# Patient Record
Sex: Female | Born: 1985 | Race: White | Hispanic: No | Smoking: Never smoker
Health system: Southern US, Community
[De-identification: ages and names within clinical notes are randomized; demographics above are authoritative.]

## PROBLEM LIST (undated history)

## (undated) DIAGNOSIS — F419 Anxiety disorder, unspecified: Secondary | ICD-10-CM

## (undated) DIAGNOSIS — O1413 Severe pre-eclampsia, third trimester: Secondary | ICD-10-CM

## (undated) DIAGNOSIS — R87629 Unspecified abnormal cytological findings in specimens from vagina: Secondary | ICD-10-CM

## (undated) HISTORY — PX: TONSILLECTOMY: SUR1361

## (undated) HISTORY — PX: COLONOSCOPY: SHX174

## (undated) HISTORY — PX: WISDOM TOOTH EXTRACTION: SHX21

## (undated) HISTORY — DX: Severe pre-eclampsia, third trimester: O14.13

---

## 2016-04-01 ENCOUNTER — Other Ambulatory Visit (HOSPITAL_COMMUNITY)
Admission: RE | Admit: 2016-04-01 | Discharge: 2016-04-01 | Disposition: A | Discharge status: Home / Self Care | Payer: 59 | Source: Ambulatory Visit | Attending: Obstetrics & Gynecology | Admitting: Obstetrics & Gynecology

## 2016-04-01 ENCOUNTER — Other Ambulatory Visit: Payer: Self-pay | Admitting: Obstetrics & Gynecology

## 2016-04-01 DIAGNOSIS — Z1151 Encounter for screening for human papillomavirus (HPV): Secondary | ICD-10-CM | POA: Diagnosis not present

## 2016-04-01 DIAGNOSIS — Z3009 Encounter for other general counseling and advice on contraception: Secondary | ICD-10-CM | POA: Diagnosis not present

## 2016-04-01 DIAGNOSIS — T8332XA Displacement of intrauterine contraceptive device, initial encounter: Secondary | ICD-10-CM | POA: Diagnosis not present

## 2016-04-01 DIAGNOSIS — Z01419 Encounter for gynecological examination (general) (routine) without abnormal findings: Secondary | ICD-10-CM | POA: Insufficient documentation

## 2016-04-01 DIAGNOSIS — Z01411 Encounter for gynecological examination (general) (routine) with abnormal findings: Secondary | ICD-10-CM | POA: Diagnosis not present

## 2016-04-01 DIAGNOSIS — R87810 Cervical high risk human papillomavirus (HPV) DNA test positive: Secondary | ICD-10-CM | POA: Insufficient documentation

## 2016-04-03 LAB — CYTOLOGY - PAP
Diagnosis: NEGATIVE
HPV 16/18/45 genotyping: NEGATIVE
HPV: DETECTED — AB

## 2016-04-10 DIAGNOSIS — Z30432 Encounter for removal of intrauterine contraceptive device: Secondary | ICD-10-CM | POA: Diagnosis not present

## 2016-04-10 DIAGNOSIS — T8332XA Displacement of intrauterine contraceptive device, initial encounter: Secondary | ICD-10-CM | POA: Diagnosis not present

## 2016-05-28 DIAGNOSIS — Z3400 Encounter for supervision of normal first pregnancy, unspecified trimester: Secondary | ICD-10-CM | POA: Diagnosis not present

## 2016-05-28 LAB — OB RESULTS CONSOLE HIV ANTIBODY (ROUTINE TESTING): HIV: NONREACTIVE

## 2016-05-28 LAB — OB RESULTS CONSOLE RPR: RPR: NONREACTIVE

## 2016-05-28 LAB — OB RESULTS CONSOLE RUBELLA ANTIBODY, IGM: Rubella: NON-IMMUNE/NOT IMMUNE

## 2016-05-28 LAB — OB RESULTS CONSOLE HEPATITIS B SURFACE ANTIGEN: HEP B S AG: NEGATIVE

## 2016-05-29 DIAGNOSIS — O26841 Uterine size-date discrepancy, first trimester: Secondary | ICD-10-CM | POA: Diagnosis not present

## 2016-06-19 DIAGNOSIS — Z3401 Encounter for supervision of normal first pregnancy, first trimester: Secondary | ICD-10-CM | POA: Diagnosis not present

## 2016-06-19 LAB — OB RESULTS CONSOLE GC/CHLAMYDIA
CHLAMYDIA, DNA PROBE: NEGATIVE
GC PROBE AMP, GENITAL: NEGATIVE

## 2016-08-14 DIAGNOSIS — Z3400 Encounter for supervision of normal first pregnancy, unspecified trimester: Secondary | ICD-10-CM | POA: Diagnosis not present

## 2016-08-19 DIAGNOSIS — Z36 Encounter for antenatal screening for chromosomal anomalies: Secondary | ICD-10-CM | POA: Diagnosis not present

## 2016-08-19 DIAGNOSIS — O219 Vomiting of pregnancy, unspecified: Secondary | ICD-10-CM | POA: Diagnosis not present

## 2016-08-19 DIAGNOSIS — Z3402 Encounter for supervision of normal first pregnancy, second trimester: Secondary | ICD-10-CM | POA: Diagnosis not present

## 2016-10-10 DIAGNOSIS — Z3402 Encounter for supervision of normal first pregnancy, second trimester: Secondary | ICD-10-CM | POA: Diagnosis not present

## 2016-10-10 DIAGNOSIS — O219 Vomiting of pregnancy, unspecified: Secondary | ICD-10-CM | POA: Diagnosis not present

## 2016-11-07 DIAGNOSIS — O26843 Uterine size-date discrepancy, third trimester: Secondary | ICD-10-CM | POA: Diagnosis not present

## 2016-11-07 DIAGNOSIS — Z23 Encounter for immunization: Secondary | ICD-10-CM | POA: Diagnosis not present

## 2016-11-07 DIAGNOSIS — Z3403 Encounter for supervision of normal first pregnancy, third trimester: Secondary | ICD-10-CM | POA: Diagnosis not present

## 2016-11-18 ENCOUNTER — Encounter (HOSPITAL_COMMUNITY): Payer: Self-pay | Admitting: *Deleted

## 2016-11-18 ENCOUNTER — Inpatient Hospital Stay (HOSPITAL_COMMUNITY)
Admission: AD | Admit: 2016-11-18 | Discharge: 2016-11-23 | DRG: 765 | Disposition: A | Discharge status: Home / Self Care | Payer: 59 | Source: Ambulatory Visit | Attending: Obstetrics & Gynecology | Admitting: Obstetrics & Gynecology

## 2016-11-18 DIAGNOSIS — Z760 Encounter for issue of repeat prescription: Secondary | ICD-10-CM | POA: Diagnosis not present

## 2016-11-18 DIAGNOSIS — R030 Elevated blood-pressure reading, without diagnosis of hypertension: Secondary | ICD-10-CM | POA: Diagnosis present

## 2016-11-18 DIAGNOSIS — Z3A32 32 weeks gestation of pregnancy: Secondary | ICD-10-CM | POA: Diagnosis not present

## 2016-11-18 DIAGNOSIS — O3403 Maternal care for unspecified congenital malformation of uterus, third trimester: Secondary | ICD-10-CM | POA: Diagnosis present

## 2016-11-18 DIAGNOSIS — E871 Hypo-osmolality and hyponatremia: Secondary | ICD-10-CM | POA: Diagnosis present

## 2016-11-18 DIAGNOSIS — O1413 Severe pre-eclampsia, third trimester: Secondary | ICD-10-CM

## 2016-11-18 DIAGNOSIS — O1420 HELLP syndrome (HELLP), unspecified trimester: Secondary | ICD-10-CM | POA: Diagnosis present

## 2016-11-18 DIAGNOSIS — O9962 Diseases of the digestive system complicating childbirth: Secondary | ICD-10-CM | POA: Diagnosis present

## 2016-11-18 DIAGNOSIS — O365930 Maternal care for other known or suspected poor fetal growth, third trimester, not applicable or unspecified: Secondary | ICD-10-CM | POA: Diagnosis present

## 2016-11-18 DIAGNOSIS — K219 Gastro-esophageal reflux disease without esophagitis: Secondary | ICD-10-CM | POA: Diagnosis present

## 2016-11-18 DIAGNOSIS — O1424 HELLP syndrome, complicating childbirth: Secondary | ICD-10-CM | POA: Diagnosis present

## 2016-11-18 DIAGNOSIS — Q512 Other doubling of uterus: Secondary | ICD-10-CM | POA: Diagnosis not present

## 2016-11-18 DIAGNOSIS — R740 Nonspecific elevation of levels of transaminase and lactic acid dehydrogenase [LDH]: Secondary | ICD-10-CM | POA: Diagnosis not present

## 2016-11-18 DIAGNOSIS — O1403 Mild to moderate pre-eclampsia, third trimester: Secondary | ICD-10-CM | POA: Diagnosis not present

## 2016-11-18 DIAGNOSIS — O1423 HELLP syndrome (HELLP), third trimester: Secondary | ICD-10-CM | POA: Diagnosis not present

## 2016-11-18 DIAGNOSIS — O324XX0 Maternal care for high head at term, not applicable or unspecified: Secondary | ICD-10-CM | POA: Diagnosis not present

## 2016-11-18 HISTORY — DX: Severe pre-eclampsia, third trimester: O14.13

## 2016-11-18 LAB — PROTEIN / CREATININE RATIO, URINE
Creatinine, Urine: 39 mg/dL
PROTEIN CREATININE RATIO: 0.51 mg/mg{creat} — AB (ref 0.00–0.15)
TOTAL PROTEIN, URINE: 20 mg/dL

## 2016-11-18 LAB — URINALYSIS, ROUTINE W REFLEX MICROSCOPIC
Bilirubin Urine: NEGATIVE
Glucose, UA: NEGATIVE mg/dL
Hgb urine dipstick: NEGATIVE
KETONES UR: NEGATIVE mg/dL
LEUKOCYTES UA: NEGATIVE
NITRITE: NEGATIVE
PROTEIN: NEGATIVE mg/dL
Specific Gravity, Urine: 1.006 (ref 1.005–1.030)
pH: 6 (ref 5.0–8.0)

## 2016-11-18 LAB — COMPREHENSIVE METABOLIC PANEL
ALT: 396 U/L — ABNORMAL HIGH (ref 14–54)
ANION GAP: 8 (ref 5–15)
AST: 272 U/L — AB (ref 15–41)
Albumin: 3.6 g/dL (ref 3.5–5.0)
Alkaline Phosphatase: 195 U/L — ABNORMAL HIGH (ref 38–126)
BUN: 16 mg/dL (ref 6–20)
CHLORIDE: 102 mmol/L (ref 101–111)
CO2: 24 mmol/L (ref 22–32)
Calcium: 9.1 mg/dL (ref 8.9–10.3)
Creatinine, Ser: 0.88 mg/dL (ref 0.44–1.00)
Glucose, Bld: 81 mg/dL (ref 65–99)
POTASSIUM: 3.9 mmol/L (ref 3.5–5.1)
Sodium: 134 mmol/L — ABNORMAL LOW (ref 135–145)
TOTAL PROTEIN: 7.1 g/dL (ref 6.5–8.1)
Total Bilirubin: 1 mg/dL (ref 0.3–1.2)

## 2016-11-18 LAB — CBC
HEMATOCRIT: 37.1 % (ref 36.0–46.0)
HEMOGLOBIN: 13.7 g/dL (ref 12.0–15.0)
MCH: 33.7 pg (ref 26.0–34.0)
MCHC: 36.9 g/dL — ABNORMAL HIGH (ref 30.0–36.0)
MCV: 91.4 fL (ref 78.0–100.0)
Platelets: 113 10*3/uL — ABNORMAL LOW (ref 150–400)
RBC: 4.06 MIL/uL (ref 3.87–5.11)
RDW: 12.7 % (ref 11.5–15.5)
WBC: 7.8 10*3/uL (ref 4.0–10.5)

## 2016-11-18 LAB — URIC ACID: Uric Acid, Serum: 6 mg/dL (ref 2.3–6.6)

## 2016-11-18 LAB — LACTATE DEHYDROGENASE: LDH: 300 U/L — ABNORMAL HIGH (ref 98–192)

## 2016-11-18 MED ORDER — LACTATED RINGERS IV SOLN
INTRAVENOUS | Status: DC
  Administered 2016-11-19: 10:00:00 via INTRAVENOUS

## 2016-11-18 MED ORDER — BETAMETHASONE SOD PHOS & ACET 6 (3-3) MG/ML IJ SUSP
12.0000 mg | INTRAMUSCULAR | Status: AC
  Administered 2016-11-18 – 2016-11-19 (×2): 12 mg via INTRAMUSCULAR
  Filled 2016-11-18 (×2): qty 2

## 2016-11-18 MED ORDER — CALCIUM CARBONATE ANTACID 500 MG PO CHEW: 2.0000 | CHEWABLE_TABLET | ORAL | Status: DC | PRN

## 2016-11-18 MED ORDER — PRENATAL MULTIVITAMIN CH: 1.0000 | ORAL_TABLET | Freq: Every day | ORAL | Status: DC

## 2016-11-18 MED ORDER — DOCUSATE SODIUM 100 MG PO CAPS: 100.0000 mg | ORAL_CAPSULE | Freq: Every day | ORAL | Status: DC

## 2016-11-18 MED ORDER — LABETALOL HCL 5 MG/ML IV SOLN
20.0000 mg | INTRAVENOUS | Status: DC | PRN
  Filled 2016-11-18: qty 16

## 2016-11-18 MED ORDER — ZOLPIDEM TARTRATE 5 MG PO TABS: 5.0000 mg | ORAL_TABLET | Freq: Every evening | ORAL | Status: DC | PRN

## 2016-11-18 MED ORDER — HYDRALAZINE HCL 20 MG/ML IJ SOLN
10.0000 mg | Freq: Once | INTRAMUSCULAR | Status: DC | PRN
  Filled 2016-11-18: qty 0.5

## 2016-11-18 MED ORDER — ACETAMINOPHEN 325 MG PO TABS
650.0000 mg | ORAL_TABLET | ORAL | Status: DC | PRN
  Administered 2016-11-19: 650 mg via ORAL
  Filled 2016-11-18: qty 2

## 2016-11-18 MED ORDER — PANTOPRAZOLE SODIUM 40 MG IV SOLR
40.0000 mg | Freq: Once | INTRAVENOUS | Status: AC
  Administered 2016-11-18: 40 mg via INTRAVENOUS
  Filled 2016-11-18: qty 40

## 2016-11-18 MED ORDER — MAGNESIUM SULFATE 40 G IN LACTATED RINGERS - SIMPLE
1.0000 g/h | INTRAVENOUS | Status: DC
  Administered 2016-11-18 – 2016-11-19 (×2): 2 g/h via INTRAVENOUS
  Administered 2016-11-21: 1 g/h via INTRAVENOUS
  Filled 2016-11-18 (×2): qty 40
  Filled 2016-11-18 (×2): qty 500

## 2016-11-18 MED ORDER — MAGNESIUM SULFATE BOLUS VIA INFUSION
4.0000 g | Freq: Once | INTRAVENOUS | Status: AC
  Administered 2016-11-18: 4 g via INTRAVENOUS
  Filled 2016-11-18: qty 500

## 2016-11-18 NOTE — MAU Note (Signed)
Pt presents to MAU with complaints of elevated liver enzymes and increase in blood pressure. Pt states she had an insurance client come over to fill out a life insurance policy and they drew labs. She reviewed her labs online Saturday and her liver enzymes were elevated. Denies any headache of blurred vision.

## 2016-11-18 NOTE — H&P (Signed)
Beverly Gonzalez is a 31 y.o. female G1P0 at 32 wks and 0 days. presenting for elevated blood pressure and elevated liver enzymes from out side lab.Pt had labs drawn 11/13/2016 at outside lab for life insurance and noted that her AST was 89 and ALT was 98. She began checking her bp at home this afternoon and was elevated 14-150/80-90. She denies headache visual disturbances or ruq pain. + FM no lof no contractions.    OB History    Gravida Para Term Preterm AB Living   1             SAB TAB Ectopic Multiple Live Births                 History reviewed. No pertinent past medical history. Past Surgical History:  Procedure Laterality Date  . TONSILLECTOMY    . WISDOM TOOTH EXTRACTION     Family History: family history is not on file. Social History:  reports that she has never smoked. She has never used smokeless tobacco. She reports that she does not drink alcohol or use drugs.    Review of Systems  Constitutional: Negative.   HENT: Negative.   Eyes: Negative.   Respiratory: Negative.   Cardiovascular: Negative.   Gastrointestinal: Negative.   Genitourinary: Negative.   Skin: Negative.   Neurological: Negative.   Endo/Heme/Allergies: Negative.   Psychiatric/Behavioral: Negative.    History Dilation: Closed Exam by:: DR Kamron Vanwyhe Blood pressure (!) 144/96, pulse (!) 57, temperature 98 F (36.7 C), resp. rate 18, height 5\' 10"  (1.778 m), weight 76.2 kg (168 lb), last menstrual period 04/08/2016. Exam Physical Exam  Vitals reviewed. Constitutional: She is oriented to person, place, and time. She appears well-developed and well-nourished.  HENT:  Head: Normocephalic and atraumatic.  Eyes: Conjunctivae are normal. Pupils are equal, round, and reactive to light.  Neck: Normal range of motion.  Cardiovascular: Normal rate and regular rhythm.   Respiratory: Effort normal and breath sounds normal. No respiratory distress. She has no wheezes. She has no rales. She exhibits no  tenderness.  GI: There is no tenderness.  Genitourinary: Vagina normal.  Musculoskeletal: Normal range of motion. She exhibits no edema.  Neurological: She is alert and oriented to person, place, and time. She displays abnormal reflex.  Skin: Skin is warm and dry.  Psychiatric: She has a normal mood and affect.   Cervix is closed   Prenatal labs: ABO, Rh:  O positive  Antibody:  Negative  Rubella:  Nonimmune  RPR:  nonreactive   HBsAg:   Negative  HIV:   Negative  GBS:   pending  Bedside ultrasound for presentation by me fetus is cephalic    Results for orders placed or performed during the hospital encounter of 11/18/16 (from the past 24 hour(s))  Protein / creatinine ratio, urine     Status: Abnormal   Collection Time: 11/18/16  5:14 PM  Result Value Ref Range   Creatinine, Urine 39.00 mg/dL   Total Protein, Urine 20 mg/dL   Protein Creatinine Ratio 0.51 (H) 0.00 - 0.15 mg/mg[Cre]  Urinalysis, Routine w reflex microscopic     Status: None   Collection Time: 11/18/16  5:40 PM  Result Value Ref Range   Color, Urine YELLOW YELLOW   APPearance CLEAR CLEAR   Specific Gravity, Urine 1.006 1.005 - 1.030   pH 6.0 5.0 - 8.0   Glucose, UA NEGATIVE NEGATIVE mg/dL   Hgb urine dipstick NEGATIVE NEGATIVE   Bilirubin Urine NEGATIVE NEGATIVE  Ketones, ur NEGATIVE NEGATIVE mg/dL   Protein, ur NEGATIVE NEGATIVE mg/dL   Nitrite NEGATIVE NEGATIVE   Leukocytes, UA NEGATIVE NEGATIVE  Comprehensive metabolic panel     Status: Abnormal   Collection Time: 11/18/16  5:41 PM  Result Value Ref Range   Sodium 134 (L) 135 - 145 mmol/L   Potassium 3.9 3.5 - 5.1 mmol/L   Chloride 102 101 - 111 mmol/L   CO2 24 22 - 32 mmol/L   Glucose, Bld 81 65 - 99 mg/dL   BUN 16 6 - 20 mg/dL   Creatinine, Ser 0.88 0.44 - 1.00 mg/dL   Calcium 9.1 8.9 - 10.3 mg/dL   Total Protein 7.1 6.5 - 8.1 g/dL   Albumin 3.6 3.5 - 5.0 g/dL   AST 272 (H) 15 - 41 U/L   ALT 396 (H) 14 - 54 U/L   Alkaline Phosphatase 195  (H) 38 - 126 U/L   Total Bilirubin 1.0 0.3 - 1.2 mg/dL   GFR calc non Af Amer >60 >60 mL/min   GFR calc Af Amer >60 >60 mL/min   Anion gap 8 5 - 15  CBC     Status: Abnormal   Collection Time: 11/18/16  5:41 PM  Result Value Ref Range   WBC 7.8 4.0 - 10.5 K/uL   RBC 4.06 3.87 - 5.11 MIL/uL   Hemoglobin 13.7 12.0 - 15.0 g/dL   HCT 37.1 36.0 - 46.0 %   MCV 91.4 78.0 - 100.0 fL   MCH 33.7 26.0 - 34.0 pg   MCHC 36.9 (H) 30.0 - 36.0 g/dL   RDW 12.7 11.5 - 15.5 %   Platelets 113 (L) 150 - 400 K/uL  Lactate dehydrogenase     Status: Abnormal   Collection Time: 11/18/16  5:45 PM  Result Value Ref Range   LDH 300 (H) 98 - 192 U/L  Uric acid     Status: None   Collection Time: 11/18/16  5:45 PM  Result Value Ref Range   Uric Acid, Serum 6.0 2.3 - 6.6 mg/dL   Assessment/Plan: 32 wks and 0 days with preeclampsia with severe features.  -BMZ for fetal lung maturity  -Magnesium sulfate for seizure prophylaxis  -Labetalol / hydralazine for bp greater than  -PIH labs every 8 hours  -Spoke with Renella Cunas via phone she recommends expectant management until steroids are complete unless patients condition worsens MFM consult in the morning with ultrasound for EFW/AFI -NICU consult Dr. Cletis Media covering this evening Dr. Nelda Marseille to assume pt care at 7 am in the morning.    Beverly Gonzalez J. 11/18/2016, 8:17 PM

## 2016-11-18 NOTE — MAU Provider Note (Signed)
Chief Complaint:  Hypertension   HPI: Beverly Gonzalez is a 31 y.o. G1P0 at [redacted]w[redacted]d who presents to maternity admissions reporting elevated BP today with known transaminitis.  Last week patient was getting bloodwork for life insurance policy, was informed had elevated LFTs and mildly elevated protein in her urine (per paperwork of lab results: 11/06/16- creatinine 0.97, AST 89, ALT 98, PC ratio 0.2, no CBC drawn). She called her OB doctor's office, and was informed to watch her BPs. 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 So she came in to be evaluated. She denies any HAs, blurred vision, RUQ/epigastric pain, however is having severe heartburn (substernal burning), that is not being relieved by Tums or Zantac.   Denies contractions, leakage of fluid or vaginal bleeding. Good fetal movement.     Past Medical History: History reviewed. No pertinent past medical history.  Past obstetric history: OB History  Gravida Para Term Preterm AB Living  1            SAB TAB Ectopic Multiple Live Births               # Outcome Date GA Lbr Len/2nd Weight Sex Delivery Anes PTL Lv  1 Current               Past Surgical History: Past Surgical History:  Procedure Laterality Date  . TONSILLECTOMY    . WISDOM TOOTH EXTRACTION       Family History: History reviewed. No pertinent family history.  Social History: Social History  Substance Use Topics  . Smoking status: Never Smoker  . Smokeless tobacco: Never Used  . Alcohol use No    Allergies: No Known Allergies  Meds:  Prescriptions Prior to Admission  Medication Sig Dispense Refill Last Dose  . calcium carbonate (TUMS - DOSED IN MG ELEMENTAL CALCIUM) 500 MG chewable tablet Chew 1 tablet by mouth 3 (three) times daily as needed for indigestion or heartburn.   11/18/2016 at Unknown time  . Doxylamine-Pyridoxine (DICLEGIS) 10-10 MG TBEC Take 2 tablets by  mouth every evening.   11/17/2016 at Unknown time  . ondansetron (ZOFRAN-ODT) 4 MG disintegrating tablet Take 4 mg by mouth every 8 (eight) hours as needed for nausea or vomiting.   11/18/2016 at Unknown time  . ranitidine (ZANTAC) 75 MG tablet Take 75 mg by mouth 2 (two) times daily.   11/18/2016 at Unknown time    I have reviewed patient's Past Medical Hx, Surgical Hx, Family Hx, Social Hx, medications and allergies.   ROS:  A comprehensive ROS was negative except per HPI.    Physical Exam  Patient Vitals for the past 24 hrs:  BP Temp Pulse Resp Height Weight  11/18/16 1900 (!) 144/96 - (!) 57 - - -  11/18/16 1845 133/82 - (!) 43 - - -  11/18/16 1830 138/82 - (!) 45 - - -  11/18/16 1815 (!) 143/84 - (!) 41 - - -  11/18/16 1800 (!) 149/89 - (!) 44 - - -  11/18/16 1755 (!) 146/86 - (!) 45 - - -  11/18/16 1736 (!) 159/93 98 F (36.7 C) (!) 49 18 5\' 10"  (1.778 m) 168 lb (76.2 kg)   Constitutional: Well-developed, well-nourished female in no acute distress.  Cardiovascular: normal rate Respiratory: normal effort GI: Abd soft, non-tender, gravid appropriate for gestational age. Pos BS x 4 MS: Extremities nontender, no edema, normal ROM  Neurologic: Alert and oriented x 4.  GU: Neg CVAT. Pelvic: NEFG, physiologic discharge, no blood, cervix clean. No CMT     Labs: Results for orders placed or performed during the hospital encounter of 11/18/16 (from the past 24 hour(s))  Protein / creatinine ratio, urine     Status: Abnormal   Collection Time: 11/18/16  5:14 PM  Result Value Ref Range   Creatinine, Urine 39.00 mg/dL   Total Protein, Urine 20 mg/dL   Protein Creatinine Ratio 0.51 (H) 0.00 - 0.15 mg/mg[Cre]  Urinalysis, Routine w reflex microscopic     Status: None   Collection Time: 11/18/16  5:40 PM  Result Value Ref Range   Color, Urine YELLOW YELLOW   APPearance CLEAR CLEAR   Specific Gravity, Urine 1.006 1.005 - 1.030   pH 6.0 5.0 - 8.0   Glucose, UA NEGATIVE NEGATIVE mg/dL    Hgb urine dipstick NEGATIVE NEGATIVE   Bilirubin Urine NEGATIVE NEGATIVE   Ketones, ur NEGATIVE NEGATIVE mg/dL   Protein, ur NEGATIVE NEGATIVE mg/dL   Nitrite NEGATIVE NEGATIVE   Leukocytes, UA NEGATIVE NEGATIVE  Comprehensive metabolic panel     Status: Abnormal   Collection Time: 11/18/16  5:41 PM  Result Value Ref Range   Sodium 134 (L) 135 - 145 mmol/L   Potassium 3.9 3.5 - 5.1 mmol/L   Chloride 102 101 - 111 mmol/L   CO2 24 22 - 32 mmol/L   Glucose, Bld 81 65 - 99 mg/dL   BUN 16 6 - 20 mg/dL   Creatinine, Ser 0.88 0.44 - 1.00 mg/dL   Calcium 9.1 8.9 - 10.3 mg/dL   Total Protein 7.1 6.5 - 8.1 g/dL   Albumin 3.6 3.5 - 5.0 g/dL   AST 272 (H) 15 - 41 U/L   ALT 396 (H) 14 - 54 U/L   Alkaline Phosphatase 195 (H) 38 - 126 U/L   Total Bilirubin 1.0 0.3 - 1.2 mg/dL   GFR calc non Af Amer >60 >60 mL/min   GFR calc Af Amer >60 >60 mL/min   Anion gap 8 5 - 15  CBC     Status: Abnormal   Collection Time: 11/18/16  5:41 PM  Result Value Ref Range   WBC 7.8 4.0 - 10.5 K/uL   RBC 4.06 3.87 - 5.11 MIL/uL   Hemoglobin 13.7 12.0 - 15.0 g/dL   HCT 37.1 36.0 - 46.0 %   MCV 91.4 78.0 - 100.0 fL   MCH 33.7 26.0 - 34.0 pg   MCHC 36.9 (H) 30.0 - 36.0 g/dL   RDW 12.7 11.5 - 15.5 %   Platelets 113 (L) 150 - 400 K/uL    Imaging:  No results found.  MAU Course: CMP- AST 89>272; ALT 98>396 CBC - Plt 112 PC Ratio - 0.5 BPs 140s/80-90s  7:27 PM - Dr. Landry Mellow was present in MAU seeing the patient at the same time. She will be admitted, plan to be determined by Dr. Landry Mellow and MFM.    I personally reviewed the patient's NST today, found to be REACTIVE. 135 bpm, mod var, +accels, no decels. CTX: occasional, not strong.   MDM: Plan of care reviewed with patient, including labs and tests ordered and medical treatment. Dr. Landry Mellow here to discuss with the patient plan of care and possible delivery.    Assessment: Preeclampsia with severe features (LFTs, Platelets).    Plan: Admit to inpatient,  plan to be determined by Dr. Landry Mellow and MFM.    Katherine Basset, DO  [facility], [facility] 11/18/2016 7:07 PM

## 2016-11-19 ENCOUNTER — Inpatient Hospital Stay (HOSPITAL_COMMUNITY): Payer: 59 | Admitting: Anesthesiology

## 2016-11-19 ENCOUNTER — Inpatient Hospital Stay (HOSPITAL_COMMUNITY): Payer: 59

## 2016-11-19 LAB — COMPREHENSIVE METABOLIC PANEL
ALBUMIN: 3.6 g/dL (ref 3.5–5.0)
ALK PHOS: 173 U/L — AB (ref 38–126)
ALK PHOS: 205 U/L — AB (ref 38–126)
ALT: 361 U/L — AB (ref 14–54)
ALT: 351 U/L — AB (ref 14–54)
ALT: 349 U/L — ABNORMAL HIGH (ref 14–54)
AST: 258 U/L — AB (ref 15–41)
AST: 222 U/L — AB (ref 15–41)
AST: 200 U/L — ABNORMAL HIGH (ref 15–41)
Albumin: 2.9 g/dL — ABNORMAL LOW (ref 3.5–5.0)
Albumin: 3.4 g/dL — ABNORMAL LOW (ref 3.5–5.0)
Alkaline Phosphatase: 188 U/L — ABNORMAL HIGH (ref 38–126)
Anion gap: 9 (ref 5–15)
Anion gap: 12 (ref 5–15)
Anion gap: 12 (ref 5–15)
BILIRUBIN TOTAL: 0.8 mg/dL (ref 0.3–1.2)
BUN: 14 mg/dL (ref 6–20)
BUN: 13 mg/dL (ref 6–20)
BUN: 14 mg/dL (ref 6–20)
CALCIUM: 8.3 mg/dL — AB (ref 8.9–10.3)
CHLORIDE: 103 mmol/L (ref 101–111)
CHLORIDE: 100 mmol/L — AB (ref 101–111)
CO2: 20 mmol/L — AB (ref 22–32)
CO2: 19 mmol/L — ABNORMAL LOW (ref 22–32)
CO2: 20 mmol/L — AB (ref 22–32)
CREATININE: 0.8 mg/dL (ref 0.44–1.00)
Calcium: 7.7 mg/dL — ABNORMAL LOW (ref 8.9–10.3)
Calcium: 7.5 mg/dL — ABNORMAL LOW (ref 8.9–10.3)
Chloride: 100 mmol/L — ABNORMAL LOW (ref 101–111)
Creatinine, Ser: 0.79 mg/dL (ref 0.44–1.00)
Creatinine, Ser: 0.85 mg/dL (ref 0.44–1.00)
GFR calc Af Amer: >60 mL/min (ref >60)
GFR calc non Af Amer: >60 mL/min (ref >60)
GLUCOSE: 118 mg/dL — AB (ref 65–99)
Glucose, Bld: 111 mg/dL — ABNORMAL HIGH (ref 65–99)
Glucose, Bld: 134 mg/dL — ABNORMAL HIGH (ref 65–99)
POTASSIUM: 3.9 mmol/L (ref 3.5–5.1)
POTASSIUM: 3.7 mmol/L (ref 3.5–5.1)
Potassium: 3.8 mmol/L (ref 3.5–5.1)
SODIUM: 132 mmol/L — AB (ref 135–145)
Sodium: 131 mmol/L — ABNORMAL LOW (ref 135–145)
Sodium: 132 mmol/L — ABNORMAL LOW (ref 135–145)
Total Bilirubin: 1.1 mg/dL (ref 0.3–1.2)
Total Bilirubin: 0.9 mg/dL (ref 0.3–1.2)
Total Protein: 6.3 g/dL — ABNORMAL LOW (ref 6.5–8.1)
Total Protein: 6.9 g/dL (ref 6.5–8.1)
Total Protein: 6.9 g/dL (ref 6.5–8.1)

## 2016-11-19 LAB — CBC
HCT: 42.4 % (ref 36.0–46.0)
HEMATOCRIT: 33.1 % — AB (ref 36.0–46.0)
HEMATOCRIT: 36 % (ref 36.0–46.0)
HEMOGLOBIN: 15 g/dL (ref 12.0–15.0)
Hemoglobin: 12.1 g/dL (ref 12.0–15.0)
Hemoglobin: 13.3 g/dL (ref 12.0–15.0)
MCH: 33.3 pg (ref 26.0–34.0)
MCH: 33.7 pg (ref 26.0–34.0)
MCH: 32.5 pg (ref 26.0–34.0)
MCHC: 36.6 g/dL — ABNORMAL HIGH (ref 30.0–36.0)
MCHC: 36.9 g/dL — ABNORMAL HIGH (ref 30.0–36.0)
MCHC: 35.4 g/dL (ref 30.0–36.0)
MCV: 91.2 fL (ref 78.0–100.0)
MCV: 91.1 fL (ref 78.0–100.0)
MCV: 92 fL (ref 78.0–100.0)
PLATELETS: 96 10*3/uL — AB (ref 150–400)
Platelets: 114 10*3/uL — ABNORMAL LOW (ref 150–400)
Platelets: 123 10*3/uL — ABNORMAL LOW (ref 150–400)
RBC: 3.63 MIL/uL — ABNORMAL LOW (ref 3.87–5.11)
RBC: 3.95 MIL/uL (ref 3.87–5.11)
RBC: 4.61 MIL/uL (ref 3.87–5.11)
RDW: 12.2 % (ref 11.5–15.5)
RDW: 12.7 % (ref 11.5–15.5)
RDW: 12.8 % (ref 11.5–15.5)
WBC: 9.4 10*3/uL (ref 4.0–10.5)
WBC: 7.1 10*3/uL (ref 4.0–10.5)
WBC: 7.4 10*3/uL (ref 4.0–10.5)

## 2016-11-19 LAB — ABO/RH: ABO/RH(D): O POS

## 2016-11-19 LAB — MAGNESIUM
MAGNESIUM: 6.9 mg/dL — AB (ref 1.7–2.4)
Magnesium: 4.9 mg/dL — ABNORMAL HIGH (ref 1.7–2.4)

## 2016-11-19 LAB — LACTATE DEHYDROGENASE: LDH: 276 U/L — ABNORMAL HIGH (ref 98–192)

## 2016-11-19 MED ORDER — OXYTOCIN 40 UNITS IN LACTATED RINGERS INFUSION - SIMPLE MED: 2.5000 [IU]/h | INTRAVENOUS | Status: DC

## 2016-11-19 MED ORDER — MISOPROSTOL 25 MCG QUARTER TABLET
25.0000 ug | ORAL_TABLET | ORAL | Status: DC | PRN
  Administered 2016-11-19 – 2016-11-20 (×5): 25 ug via VAGINAL
  Filled 2016-11-19 (×5): qty 1

## 2016-11-19 MED ORDER — PANTOPRAZOLE SODIUM 20 MG PO TBEC
20.0000 mg | DELAYED_RELEASE_TABLET | Freq: Every day | ORAL | Status: DC
  Filled 2016-11-19 (×3): qty 1

## 2016-11-19 MED ORDER — PHENYLEPHRINE 40 MCG/ML (10ML) SYRINGE FOR IV PUSH (FOR BLOOD PRESSURE SUPPORT)
80.0000 ug | PREFILLED_SYRINGE | INTRAVENOUS | Status: DC | PRN
  Filled 2016-11-19: qty 10

## 2016-11-19 MED ORDER — ONDANSETRON HCL 4 MG/2ML IJ SOLN
4.0000 mg | Freq: Four times a day (QID) | INTRAMUSCULAR | Status: DC | PRN
  Administered 2016-11-19: 4 mg via INTRAVENOUS
  Filled 2016-11-19 (×2): qty 2

## 2016-11-19 MED ORDER — OXYCODONE-ACETAMINOPHEN 5-325 MG PO TABS: 1.0000 | ORAL_TABLET | ORAL | Status: DC | PRN

## 2016-11-19 MED ORDER — EPHEDRINE 5 MG/ML INJ: 10.0000 mg | INTRAVENOUS | Status: DC | PRN

## 2016-11-19 MED ORDER — DIPHENHYDRAMINE HCL 50 MG/ML IJ SOLN
12.5000 mg | INTRAMUSCULAR | Status: DC | PRN
  Administered 2016-11-20: 12.5 mg via INTRAVENOUS
  Filled 2016-11-19: qty 1

## 2016-11-19 MED ORDER — LIDOCAINE HCL (PF) 1 % IJ SOLN: 30.0000 mL | INTRAMUSCULAR | Status: DC | PRN

## 2016-11-19 MED ORDER — SOD CITRATE-CITRIC ACID 500-334 MG/5ML PO SOLN
30.0000 mL | ORAL | Status: DC | PRN
  Administered 2016-11-20: 30 mL via ORAL
  Filled 2016-11-19: qty 15

## 2016-11-19 MED ORDER — LACTATED RINGERS IV SOLN: 500.0000 mL | INTRAVENOUS | Status: DC | PRN

## 2016-11-19 MED ORDER — PENICILLIN G POTASSIUM 5000000 UNITS IJ SOLR
5.0000 10*6.[IU] | Freq: Once | INTRAVENOUS | Status: AC
  Administered 2016-11-20: 5 10*6.[IU] via INTRAVENOUS
  Filled 2016-11-19: qty 5

## 2016-11-19 MED ORDER — TERBUTALINE SULFATE 1 MG/ML IJ SOLN: 0.2500 mg | Freq: Once | INTRAMUSCULAR | Status: DC | PRN

## 2016-11-19 MED ORDER — OXYCODONE-ACETAMINOPHEN 5-325 MG PO TABS: 2.0000 | ORAL_TABLET | ORAL | Status: DC | PRN

## 2016-11-19 MED ORDER — LACTATED RINGERS IV SOLN
500.0000 mL | Freq: Once | INTRAVENOUS | Status: AC
  Administered 2016-11-19: 500 mL via INTRAVENOUS

## 2016-11-19 MED ORDER — PENICILLIN G POT IN DEXTROSE 60000 UNIT/ML IV SOLN
3.0000 10*6.[IU] | INTRAVENOUS | Status: DC
  Administered 2016-11-20: 3 10*6.[IU] via INTRAVENOUS
  Filled 2016-11-19 (×4): qty 50

## 2016-11-19 MED ORDER — OXYTOCIN BOLUS FROM INFUSION: 500.0000 mL | Freq: Once | INTRAVENOUS | Status: DC

## 2016-11-19 MED ORDER — ONDANSETRON HCL 4 MG/2ML IJ SOLN
4.0000 mg | Freq: Three times a day (TID) | INTRAMUSCULAR | Status: DC | PRN
  Administered 2016-11-19: 4 mg via INTRAVENOUS
  Filled 2016-11-19: qty 2

## 2016-11-19 MED ORDER — LACTATED RINGERS IV SOLN
INTRAVENOUS | Status: DC
  Administered 2016-11-19 – 2016-11-20 (×3): via INTRAVENOUS

## 2016-11-19 MED ORDER — FENTANYL 2.5 MCG/ML BUPIVACAINE 1/10 % EPIDURAL INFUSION (WH - ANES)
14.0000 mL/h | INTRAMUSCULAR | Status: DC | PRN
  Administered 2016-11-19 – 2016-11-20 (×2): 14 mL/h via EPIDURAL
  Filled 2016-11-19 (×3): qty 100

## 2016-11-19 MED ORDER — ACETAMINOPHEN 325 MG PO TABS
650.0000 mg | ORAL_TABLET | ORAL | Status: DC | PRN
  Administered 2016-11-19 – 2016-11-20 (×2): 650 mg via ORAL
  Filled 2016-11-19 (×2): qty 2

## 2016-11-19 NOTE — Progress Notes (Addendum)
Antepartum HD #2  S: Patient resting comfortably in bed.  Reports slight headache since Magnesium has been started, improved to 4/10 with Tylenol.  Denies abdominal or RUQ pain.  No blurry vision.  +FM, no contractions, no LOF, no vaginal bleeding  O: BP 134/88 (BP Location: Right Arm)   Pulse 71   Temp 97.7 F (36.5 C) (Oral)   Resp 16   Ht 5\' 10"  (1.778 m)   Wt 168 lb (76.2 kg)   LMP 04/08/2016   SpO2 99%   BMI 24.11 kg/m   BP range: 123-138/73-88  Gen: NAD CV: RRR Lungs: CTAB Abd: soft, non-tender, no RUQ pain GU: Deferred Ext: no edema, no calf tenderness, hyperreflexia  FHT: 100, moderate variability, +accels, no decels Toco: no contractions  Results for orders placed or performed during the hospital encounter of 11/18/16 (from the past 24 hour(s))  Protein / creatinine ratio, urine     Status: Abnormal   Collection Time: 11/18/16  5:14 PM  Result Value Ref Range   Creatinine, Urine 39.00 mg/dL   Total Protein, Urine 20 mg/dL   Protein Creatinine Ratio 0.51 (H) 0.00 - 0.15 mg/mg[Cre]  Urinalysis, Routine w reflex microscopic     Status: None   Collection Time: 11/18/16  5:40 PM  Result Value Ref Range   Color, Urine YELLOW YELLOW   APPearance CLEAR CLEAR   Specific Gravity, Urine 1.006 1.005 - 1.030   pH 6.0 5.0 - 8.0   Glucose, UA NEGATIVE NEGATIVE mg/dL   Hgb urine dipstick NEGATIVE NEGATIVE   Bilirubin Urine NEGATIVE NEGATIVE   Ketones, ur NEGATIVE NEGATIVE mg/dL   Protein, ur NEGATIVE NEGATIVE mg/dL   Nitrite NEGATIVE NEGATIVE   Leukocytes, UA NEGATIVE NEGATIVE  Comprehensive metabolic panel     Status: Abnormal   Collection Time: 11/18/16  5:41 PM  Result Value Ref Range   Sodium 134 (L) 135 - 145 mmol/L   Potassium 3.9 3.5 - 5.1 mmol/L   Chloride 102 101 - 111 mmol/L   CO2 24 22 - 32 mmol/L   Glucose, Bld 81 65 - 99 mg/dL   BUN 16 6 - 20 mg/dL   Creatinine, Ser 0.88 0.44 - 1.00 mg/dL   Calcium 9.1 8.9 - 10.3 mg/dL   Total Protein 7.1 6.5 - 8.1 g/dL    Albumin 3.6 3.5 - 5.0 g/dL   AST 272 (H) 15 - 41 U/L   ALT 396 (H) 14 - 54 U/L   Alkaline Phosphatase 195 (H) 38 - 126 U/L   Total Bilirubin 1.0 0.3 - 1.2 mg/dL   GFR calc non Af Amer >60 >60 mL/min   GFR calc Af Amer >60 >60 mL/min   Anion gap 8 5 - 15  CBC     Status: Abnormal   Collection Time: 11/18/16  5:41 PM  Result Value Ref Range   WBC 7.8 4.0 - 10.5 K/uL   RBC 4.06 3.87 - 5.11 MIL/uL   Hemoglobin 13.7 12.0 - 15.0 g/dL   HCT 37.1 36.0 - 46.0 %   MCV 91.4 78.0 - 100.0 fL   MCH 33.7 26.0 - 34.0 pg   MCHC 36.9 (H) 30.0 - 36.0 g/dL   RDW 12.7 11.5 - 15.5 %   Platelets 113 (L) 150 - 400 K/uL  Lactate dehydrogenase     Status: Abnormal   Collection Time: 11/18/16  5:45 PM  Result Value Ref Range   LDH 300 (H) 98 - 192 U/L  Uric acid  Status: None   Collection Time: 11/18/16  5:45 PM  Result Value Ref Range   Uric Acid, Serum 6.0 2.3 - 6.6 mg/dL  Type and screen [place]     Status: None   Collection Time: 11/18/16  8:05 PM  Result Value Ref Range   ABO/RH(D) O POS    Antibody Screen NEG    Sample Expiration 11/21/2016   ABO/Rh     Status: None   Collection Time: 11/18/16  8:05 PM  Result Value Ref Range   ABO/RH(D) O POS   Comprehensive metabolic panel     Status: Abnormal   Collection Time: 11/19/16  1:14 AM  Result Value Ref Range   Sodium 132 (L) 135 - 145 mmol/L   Potassium 3.8 3.5 - 5.1 mmol/L   Chloride 103 101 - 111 mmol/L   CO2 20 (L) 22 - 32 mmol/L   Glucose, Bld 118 (H) 65 - 99 mg/dL   BUN 14 6 - 20 mg/dL   Creatinine, Ser 0.80 0.44 - 1.00 mg/dL   Calcium 8.3 (L) 8.9 - 10.3 mg/dL   Total Protein 6.3 (L) 6.5 - 8.1 g/dL   Albumin 2.9 (L) 3.5 - 5.0 g/dL   AST 258 (H) 15 - 41 U/L   ALT 361 (H) 14 - 54 U/L   Alkaline Phosphatase 173 (H) 38 - 126 U/L   Total Bilirubin 1.1 0.3 - 1.2 mg/dL   GFR calc non Af Amer >60 >60 mL/min   GFR calc Af Amer >60 >60 mL/min   Anion gap 9 5 - 15  Lactate dehydrogenase     Status: Abnormal    Collection Time: 11/19/16  1:14 AM  Result Value Ref Range   LDH 276 (H) 98 - 192 U/L  CBC     Status: Abnormal   Collection Time: 11/19/16  1:14 AM  Result Value Ref Range   WBC 9.4 4.0 - 10.5 K/uL   RBC 3.63 (L) 3.87 - 5.11 MIL/uL   Hemoglobin 12.1 12.0 - 15.0 g/dL   HCT 33.1 (L) 36.0 - 46.0 %   MCV 91.2 78.0 - 100.0 fL   MCH 33.3 26.0 - 34.0 pg   MCHC 36.6 (H) 30.0 - 36.0 g/dL   RDW 12.2 11.5 - 15.5 %   Platelets 96 (L) 150 - 400 K/uL  Magnesium     Status: Abnormal   Collection Time: 11/19/16  1:14 AM  Result Value Ref Range   Magnesium 4.9 (H) 1.7 - 2.4 mg/dL   A/P: 31yo G1P0 @ [redacted]w[redacted]d admitted for preeclampsia with severe features with concern for HELLP  -FWB: Baseline FHT low, but overall FHT reassuring, pt reports good fetal movement -Preeclampsia  Currently on IV magnesium  BP stable, no medications indicated, protocol in place if needed  Pt asymptomatic  AST/ALT stable, platelets declined to <100 overnight, repeat pending for 9am and every 8hrs  MFM [place] and consultation this am to review management plan and discuss guidelines for induction   BMZ #1 given @ 2130 on 6/4, 2nd dose to be given this evening  NICU consult today -Maternal well being:   Continue general diet  Protonix daily for GERD  Tylenol as needed for headache or pain   ADDENDUM: Discussed with MFM planned for expedited BMZ and plan for IOL.  Janyth Pupa, DO [phone] (pager) [phone] (office)

## 2016-11-19 NOTE — Progress Notes (Signed)
MFM consult, staff note  Discussion:  By way of consultation, I spoke to? Beverly Gonzalez  about the risks of HELLP (hemolysis, elevated liver enzymes, and low platelets) syndrome in pregnancy. Given that she has liver enzymes that are more than 3-fold above the upper limit of normal and platelet count less than 100,000, she meets criteria for HELLP syndrome.  HELLP syndrome occurs in 0.5% of all pregnancies and is a significant risk factor for maternal and fetal morbidity and mortality.  Namely, with respect to the pregnant female, there is risk of eclampsia, hemorrhagic stroke, coagulopathy/bleeding complications and death.  With respect to the fetus, there is risk for growth restriction, abruption, and stillbirth.  Given that HELLP is unstable and there is no medical treatment other than MgSO4 for seizure/eclampsia prophylaxis through the intrapartum and postpartum period, HELLP is actively managed by delivery at term and actively managed in preterm pregnancies upon steroid completion.  The goal in preterm pregnancies complicated by HELLP is to complete the steroid course prior to delivery when medically feasible noting there is risk for deterioration during this period nonetheless.  Given that this patient is a nulliparous patient and is only 32 weeks, the risk for failed induction is high. In this setting it is reasonable to accelerate antenatal corticosteroid course and begin cervical ripening to avoid a prolonged time from diagnosis to delivery, placing the patient at increasing risk for the aforementioned adverse maternal/fetal outcomes.    Impressions:   1. SIUP a [redacted]w[redacted]d 2. HELLP syndrome 3. G1 4. IUGR complicated by abnormal UA Dopplers  Recommendations: 1. NICU consult 2. Anesthesia consult 3. Preeclampsia labs q6-8 hours 4. Glucose q6-8 hours  5. Antenatal corticosteroids (BMZ 12mg  IM q12 for 2 doses) 6. Begin cervical ripening now  7. Platelet count should be >50,000 should c/s  be needed and >20,000 for safe vaginal delivery.   8. Given need to deliver prior to 34 weeks for preeclampsia/immitator of preeclampsia complicated by IUGR, recommend antiphospholipid antibody panel to determine if the patient will need postpartum DVT/VTE prophylaxis with LMHW 40sc qd.   Time Spent: I spent in excess of 60 minutes in consultation with this patient to review records, evaluate her case, and provide her with an adequate discussion and education.  More than 50% of this time was spent in direct face-to-face counseling.  It was a pleasure seeing your patient in the office today.  Thank you for consultation. Please do not hesitate to contact our service for any further questions.      Thank you,  Delman Cheadle Harl Favor, Delman Cheadle, MD, MS, FACOG Assistant Professor Section of [place]

## 2016-11-19 NOTE — Anesthesia Preprocedure Evaluation (Addendum)
Anesthesia Evaluation  Patient identified by MRN, date of birth, ID band Patient awake    Reviewed: Allergy & Precautions, NPO status , Patient's Chart, lab work & pertinent test results  Airway Mallampati: II  TM Distance: >3 FB Neck ROM: Full    Dental no notable dental hx. (+) Dental Advisory Given   Pulmonary neg pulmonary ROS,    Pulmonary exam normal        Cardiovascular hypertension, Pt. on medications Normal cardiovascular exam     Neuro/Psych negative neurological ROS  negative psych ROS   GI/Hepatic negative GI ROS, Elevated LFT's   Endo/Other  negative endocrine ROS  Renal/GU negative Renal ROS  negative genitourinary   Musculoskeletal negative musculoskeletal ROS (+)   Abdominal   Peds negative pediatric ROS (+)  Hematology Thrombocytopenica- mild, given steroids 2 days ago   Anesthesia Other Findings   Reproductive/Obstetrics (+) Pregnancy HELP Syndrome                            Anesthesia Physical Anesthesia Plan  ASA: III and emergent  Anesthesia Plan: Epidural   Post-op Pain Management:    Induction:   PONV Risk Score and Plan: 4 or greater and Ondansetron, Dexamethasone, Propofol, Midazolam, Scopolamine patch - Pre-op, Treatment may vary due to age and Metaclopromide  Airway Management Planned: Natural Airway  Additional Equipment:   Intra-op Plan:   Post-operative Plan:   Informed Consent: I have reviewed the patients History and Physical, chart, labs and discussed the procedure including the risks, benefits and alternatives for the proposed anesthesia with the patient or authorized representative who has indicated his/her understanding and acceptance.   Dental advisory given  Plan Discussed with: CRNA, Anesthesiologist and Surgeon  Anesthesia Plan Comments: (Pt with HELP Syndrome for induction. CLE placed early while platelets are normal.  Will dose  as requested.   Patient for C/Section. Will use existing epidural catheter. M. Leonie Man, MD)       Anesthesia Quick Evaluation

## 2016-11-19 NOTE — Progress Notes (Signed)
OB PN:  S: Pt resting comfortably, reports no acute complaints.  O: BP 122/80   Pulse 65   Temp 98 F (36.7 C) (Oral)   Resp 18   Ht 5\' 10"  (1.778 m)   Wt 168 lb (76.2 kg)   LMP 04/08/2016   SpO2 99%   BMI 24.11 kg/m   FHT: 110, moderate variability, +accels, no decels Toco: no contractions SVE: closed/long/high, mid position, cytotec #1 placed  CBC Latest Ref Rng & Units 11/19/2016 11/19/2016 11/18/2016  WBC 4.0 - 10.5 K/uL 7.1 9.4 7.8  Hemoglobin 12.0 - 15.0 g/dL 13.3 12.1 13.7  Hematocrit 36.0 - 46.0 % 36.0 33.1(L) 37.1  Platelets 150 - 400 K/uL 114(L) 96(L) 113(L)   CMP Latest Ref Rng & Units 11/19/2016 11/19/2016 11/18/2016  Glucose 65 - 99 mg/dL 111(H) 118(H) 81  BUN 6 - 20 mg/dL 13 14 16   Creatinine 0.44 - 1.00 mg/dL 0.79 0.80 0.88  Sodium 135 - 145 mmol/L 131(L) 132(L) 134(L)  Potassium 3.5 - 5.1 mmol/L 3.9 3.8 3.9  Chloride 101 - 111 mmol/L 100(L) 103 102  CO2 22 - 32 mmol/L 19(L) 20(L) 24  Calcium 8.9 - 10.3 mg/dL 7.7(L) 8.3(L) 9.1  Total Protein 6.5 - 8.1 g/dL 6.9 6.3(L) 7.1  Total Bilirubin 0.3 - 1.2 mg/dL 0.9 1.1 1.0  Alkaline Phos 38 - 126 U/L 188(H) 173(H) 195(H)  AST 15 - 41 U/L 222(H) 258(H) 272(H)  ALT 14 - 54 U/L 351(H) 361(H) 396(H)    A/P: 31yo G1P0@ [redacted]w[redacted]d for IOL due to HELLP   -FWB: Cat. I -Induction: plan for cytotec per protocol -HELLP: currently on IV magnesium, BP within normal limits, labs stable as above, continue q 8hr or sooner if start to see decline in platelets, pt asymptomatic -Pain management: dry cath placed -GBS still pending, will plan to start PCN if rapid progression of dilation, rupture of membranes or Foley balloon placed  Janyth Pupa, DO [phone] (pager) [phone] (office)

## 2016-11-19 NOTE — Progress Notes (Signed)
Notified Farrel Gordon, CNM on FHR baseline change. CNM will review pt strip. No new orders at this time. Will continue to monitor.

## 2016-11-19 NOTE — Anesthesia Pain Management Evaluation Note (Signed)
  CRNA Pain Management Visit Note  Patient: Beverly Gonzalez, 31 y.o., female  "Hello I am a member of the anesthesia team at [facility]. We have an anesthesia team available at all times to provide care throughout the hospital, including epidural management and anesthesia for C-section. I don't know your plan for the delivery whether it a natural birth, water birth, IV sedation, nitrous supplementation, doula or epidural, but we want to meet your pain goals."   1.Was your pain managed to your expectations on prior hospitalizations?   yes  2.What is your expectation for pain management during this hospitalization?     epidural  3.How can we help you reach that goal? epidural  Record the patient's initial score and the patient's pain goal.   Pain: 0/10  Pain Goal: 0/10 [facility] wants you to be able to say your pain was always managed very well.  Ailene Ards 11/19/2016

## 2016-11-19 NOTE — Progress Notes (Signed)
Ceylin Dreibelbis MRN: [id number]  Subjective: -Care assumed of 31 y.o. G1P0 at [redacted]w[redacted]d who presents for Elevated BP and LFT. Patient ultimately diagnosed with HELLP syndrome and induction started after BMZ given in accelerated manner.  Patient is under the care of [facility] and is attended by Dr. Scotty Court.  In room to meet acquaintance of patient and husband, Thedore Mins.  Patient denies HA, epigastric pain, visual disturbances, and SOB.  Patient perceptive of contractions, but denies discomfort at current.  No questions or concerns.    Objective: BP 139/83   Pulse 68   Temp 99.1 F (37.3 C) (Oral)   Resp 18   Ht 5\' 10"  (1.778 m)   Wt 76.2 kg (168 lb)   LMP 04/08/2016   SpO2 100%   BMI 24.11 kg/m  I/O last 3 completed shifts: In: 2661.7 [P.O.:680; I.V.:1981.7] Out: 2550 [Urine:2550] Total I/O In: 100 [P.O.:100] Out: 150 [Urine:150]  Results for orders placed or performed during the hospital encounter of 11/18/16 (from the past 24 hour(s))  Type and screen [place]     Status: None   Collection Time: 11/18/16  8:05 PM  Result Value Ref Range   ABO/RH(D) O POS    Antibody Screen NEG    Sample Expiration 11/21/2016   ABO/Rh     Status: None   Collection Time: 11/18/16  8:05 PM  Result Value Ref Range   ABO/RH(D) O POS   Comprehensive metabolic panel     Status: Abnormal   Collection Time: 11/19/16  1:14 AM  Result Value Ref Range   Sodium 132 (L) 135 - 145 mmol/L   Potassium 3.8 3.5 - 5.1 mmol/L   Chloride 103 101 - 111 mmol/L   CO2 20 (L) 22 - 32 mmol/L   Glucose, Bld 118 (H) 65 - 99 mg/dL   BUN 14 6 - 20 mg/dL   Creatinine, Ser 0.80 0.44 - 1.00 mg/dL   Calcium 8.3 (L) 8.9 - 10.3 mg/dL   Total Protein 6.3 (L) 6.5 - 8.1 g/dL   Albumin 2.9 (L) 3.5 - 5.0 g/dL   AST 258 (H) 15 - 41 U/L   ALT 361 (H) 14 - 54 U/L   Alkaline Phosphatase 173 (H) 38 - 126 U/L   Total Bilirubin 1.1 0.3 - 1.2 mg/dL   GFR calc non Af Amer >60 >60 mL/min   GFR calc Af Amer >60  >60 mL/min   Anion gap 9 5 - 15  Lactate dehydrogenase     Status: Abnormal   Collection Time: 11/19/16  1:14 AM  Result Value Ref Range   LDH 276 (H) 98 - 192 U/L  CBC     Status: Abnormal   Collection Time: 11/19/16  1:14 AM  Result Value Ref Range   WBC 9.4 4.0 - 10.5 K/uL   RBC 3.63 (L) 3.87 - 5.11 MIL/uL   Hemoglobin 12.1 12.0 - 15.0 g/dL   HCT 33.1 (L) 36.0 - 46.0 %   MCV 91.2 78.0 - 100.0 fL   MCH 33.3 26.0 - 34.0 pg   MCHC 36.6 (H) 30.0 - 36.0 g/dL   RDW 12.2 11.5 - 15.5 %   Platelets 96 (L) 150 - 400 K/uL  Magnesium     Status: Abnormal   Collection Time: 11/19/16  1:14 AM  Result Value Ref Range   Magnesium 4.9 (H) 1.7 - 2.4 mg/dL  CBC     Status: Abnormal   Collection Time: 11/19/16 10:03 AM  Result  Value Ref Range   WBC 7.1 4.0 - 10.5 K/uL   RBC 3.95 3.87 - 5.11 MIL/uL   Hemoglobin 13.3 12.0 - 15.0 g/dL   HCT 36.0 36.0 - 46.0 %   MCV 91.1 78.0 - 100.0 fL   MCH 33.7 26.0 - 34.0 pg   MCHC 36.9 (H) 30.0 - 36.0 g/dL   RDW 12.7 11.5 - 15.5 %   Platelets 114 (L) 150 - 400 K/uL  Comprehensive metabolic panel     Status: Abnormal   Collection Time: 11/19/16 10:03 AM  Result Value Ref Range   Sodium 131 (L) 135 - 145 mmol/L   Potassium 3.9 3.5 - 5.1 mmol/L   Chloride 100 (L) 101 - 111 mmol/L   CO2 19 (L) 22 - 32 mmol/L   Glucose, Bld 111 (H) 65 - 99 mg/dL   BUN 13 6 - 20 mg/dL   Creatinine, Ser 0.79 0.44 - 1.00 mg/dL   Calcium 7.7 (L) 8.9 - 10.3 mg/dL   Total Protein 6.9 6.5 - 8.1 g/dL   Albumin 3.4 (L) 3.5 - 5.0 g/dL   AST 222 (H) 15 - 41 U/L   ALT 351 (H) 14 - 54 U/L   Alkaline Phosphatase 188 (H) 38 - 126 U/L   Total Bilirubin 0.9 0.3 - 1.2 mg/dL   GFR calc non Af Amer >60 >60 mL/min   GFR calc Af Amer >60 >60 mL/min   Anion gap 12 5 - 15  CBC     Status: Abnormal   Collection Time: 11/19/16  4:57 PM  Result Value Ref Range   WBC 7.4 4.0 - 10.5 K/uL   RBC 4.61 3.87 - 5.11 MIL/uL   Hemoglobin 15.0 12.0 - 15.0 g/dL   HCT 42.4 36.0 - 46.0 %   MCV 92.0  78.0 - 100.0 fL   MCH 32.5 26.0 - 34.0 pg   MCHC 35.4 30.0 - 36.0 g/dL   RDW 12.8 11.5 - 15.5 %   Platelets 123 (L) 150 - 400 K/uL  Comprehensive metabolic panel     Status: Abnormal   Collection Time: 11/19/16  4:57 PM  Result Value Ref Range   Sodium 132 (L) 135 - 145 mmol/L   Potassium 3.7 3.5 - 5.1 mmol/L   Chloride 100 (L) 101 - 111 mmol/L   CO2 20 (L) 22 - 32 mmol/L   Glucose, Bld 134 (H) 65 - 99 mg/dL   BUN 14 6 - 20 mg/dL   Creatinine, Ser 0.85 0.44 - 1.00 mg/dL   Calcium 7.5 (L) 8.9 - 10.3 mg/dL   Total Protein 6.9 6.5 - 8.1 g/dL   Albumin 3.6 3.5 - 5.0 g/dL   AST 200 (H) 15 - 41 U/L   ALT 349 (H) 14 - 54 U/L   Alkaline Phosphatase 205 (H) 38 - 126 U/L   Total Bilirubin 0.8 0.3 - 1.2 mg/dL   GFR calc non Af Amer >60 >60 mL/min   GFR calc Af Amer >60 >60 mL/min   Anion gap 12 5 - 15     Fetal Monitoring: FHT: 115 bpm, Mod Var, -Decels, -Accels UC: Q1-58min, palpates mild    Physical Exam: General appearance: alert, well appearing, and in no distress and oriented to person, place, and time. Chest: normal rate and regular rhythm.  clear to auscultation, no wheezes, rales or rhonchi, symmetric air entry. Abdominal exam: Soft RT, NT, Gravid, AGA. Extremities: No edema, +3 Reflexes in BLE Skin exam: Warm Dry  Vaginal Exam: SVE:  Dilation: Closed Effacement (%): 50 Station: -3 Exam by:: Sharyn Lull, RN  Membranes:Intact Internal Monitors: None  Augmentation/Induction: Pitocin:None Cytotec: 3rd Dose at 1645  Assessment:  IUP at 32.1wks Cat I FT  HELLP Syndrome MgSO4 Infusion Cervical Ripening GBS Unknown Desires MD Delivery  Plan: -Labs Q 8 hrs-Stable as Above-Next Draw Due at 0100 -BP WNL, No PIH Symptoms, Dry Catheter in Place -Discussed possibility of foley bulb placement if cervix favorable.  Patient reports being familiar with procedure and is without questions or concerns -PCN to be started with foley bulb placement, ROM, or active labor -Dr. Octavio Manns aware of patient desire for MD delivery *Informed that if rapid progression occurs midwife would attend delivery while Dr. Octavio Manns en route.  Offered faculty practice MD Nehemiah Settle) to be present, at bedside, if patient desires.  Patient reports she knows said MD and is currently unsure of him being present if rapid delivery occurs.  -Next evaluation due at 2045  -Continue other mgmt as ordered   Riley Churches, CNM 11/19/2016, 7:54 PM

## 2016-11-19 NOTE — Consult Note (Signed)
Neonatal Medicine:  Asked by Dr Nelda Marseille to consult on Beverly Gonzalez for preeclampsia and expected delivery at 5 weeks. Chart reviewed. She is [redacted] weeks gestation being induced for preeclampsia.  She has received 2 doses of betamethasone given in accelerated manner. She is currently on magnesium sulfate. EFW at 24%.  I spoke with Beverly Adair Patter in her room with her husband present. I discussed our presence at delivery and resuscitation based on infant's respiratory needs. I talked about common morbidities associated with this gestation such as RDS - possible need for vent and surfactant, GI immaturity, and their  nutritional needs. I discussed need for temp support, HAL, possible need for umbilical lines, gavage feeding, and LOS.  I also discussed breast feeding and benefits especially to a preterm baby.   I answered their questions to their satisfaction.   Thank you for inviting [place] to be a part of her care before the baby is born.  I spent 30 minutes with this consult, more than 50% of the time was with face-to-face counseling Beverly Adair Patter and her husband.    Tommie Sams, MD  Neonatologist

## 2016-11-19 NOTE — Anesthesia Procedure Notes (Signed)
Epidural Patient location during procedure: OB Start time: 11/19/2016 12:18 PM End time: 11/19/2016 12:33 PM  Staffing Anesthesiologist: Duane Boston Performed: anesthesiologist   Preanesthetic Checklist Completed: patient identified, site marked, pre-op evaluation, timeout performed, IV checked, risks and benefits discussed and monitors and equipment checked  Epidural Patient position: sitting Prep: DuraPrep Patient monitoring: heart rate, cardiac monitor, continuous pulse ox and blood pressure Approach: midline Location: L2-L3 Injection technique: LOR saline  Needle:  Needle type: Tuohy  Needle gauge: 17 G Needle length: 9 cm Needle insertion depth: 6 cm Catheter size: 20 Guage Catheter at skin depth: 12 cm  Assessment Events: blood not aspirated, injection not painful and no injection resistance  Additional Notes Informed consent obtained prior to proceeding including risk of failure, 1% risk of PDPH, risk of minor discomfort and bruising.  Discussed rare but serious complications including epidural abscess, permanent nerve injury, epidural hematoma.  Discussed alternatives to epidural analgesia and patient desires to proceed.  Timeout performed pre-procedure verifying patient name, procedure, and platelet count.  Patient tolerated procedure well.

## 2016-11-20 ENCOUNTER — Encounter (HOSPITAL_COMMUNITY): Payer: Self-pay | Admitting: Anesthesiology

## 2016-11-20 ENCOUNTER — Encounter (HOSPITAL_COMMUNITY)
Admission: AD | Disposition: A | Discharge status: Home / Self Care | Payer: Self-pay | Source: Ambulatory Visit | Attending: Obstetrics & Gynecology

## 2016-11-20 LAB — COMPREHENSIVE METABOLIC PANEL
ALBUMIN: 3.1 g/dL — AB (ref 3.5–5.0)
ALBUMIN: 3 g/dL — AB (ref 3.5–5.0)
ALBUMIN: 2.8 g/dL — AB (ref 3.5–5.0)
ALK PHOS: 161 U/L — AB (ref 38–126)
ALK PHOS: 173 U/L — AB (ref 38–126)
ALK PHOS: 152 U/L — AB (ref 38–126)
ALT: 287 U/L — ABNORMAL HIGH (ref 14–54)
ALT: 264 U/L — AB (ref 14–54)
ALT: 261 U/L — AB (ref 14–54)
ALT: 228 U/L — AB (ref 14–54)
AST: 152 U/L — AB (ref 15–41)
AST: 140 U/L — ABNORMAL HIGH (ref 15–41)
AST: 137 U/L — ABNORMAL HIGH (ref 15–41)
AST: 123 U/L — AB (ref 15–41)
Albumin: 3 g/dL — ABNORMAL LOW (ref 3.5–5.0)
Alkaline Phosphatase: 172 U/L — ABNORMAL HIGH (ref 38–126)
Anion gap: 10 (ref 5–15)
Anion gap: 8 (ref 5–15)
Anion gap: 7 (ref 5–15)
Anion gap: 7 (ref 5–15)
BILIRUBIN TOTAL: 0.8 mg/dL (ref 0.3–1.2)
BILIRUBIN TOTAL: 0.5 mg/dL (ref 0.3–1.2)
BILIRUBIN TOTAL: 0.5 mg/dL (ref 0.3–1.2)
BUN: 15 mg/dL (ref 6–20)
BUN: 17 mg/dL (ref 6–20)
BUN: 21 mg/dL — ABNORMAL HIGH (ref 6–20)
BUN: 21 mg/dL — AB (ref 6–20)
CALCIUM: 6.7 mg/dL — AB (ref 8.9–10.3)
CALCIUM: 6.7 mg/dL — AB (ref 8.9–10.3)
CALCIUM: 6.9 mg/dL — AB (ref 8.9–10.3)
CALCIUM: 6.6 mg/dL — AB (ref 8.9–10.3)
CO2: 20 mmol/L — ABNORMAL LOW (ref 22–32)
CO2: 22 mmol/L (ref 22–32)
CO2: 23 mmol/L (ref 22–32)
CO2: 23 mmol/L (ref 22–32)
CREATININE: 0.91 mg/dL (ref 0.44–1.00)
CREATININE: 1.05 mg/dL — AB (ref 0.44–1.00)
CREATININE: 1.01 mg/dL — AB (ref 0.44–1.00)
Chloride: 99 mmol/L — ABNORMAL LOW (ref 101–111)
Chloride: 101 mmol/L (ref 101–111)
Chloride: 100 mmol/L — ABNORMAL LOW (ref 101–111)
Chloride: 102 mmol/L (ref 101–111)
Creatinine, Ser: 0.9 mg/dL (ref 0.44–1.00)
GFR calc Af Amer: >60 mL/min (ref >60)
GFR calc Af Amer: >60 mL/min (ref >60)
GFR calc Af Amer: >60 mL/min (ref >60)
GFR calc non Af Amer: >60 mL/min (ref >60)
GFR calc non Af Amer: >60 mL/min (ref >60)
GLUCOSE: 132 mg/dL — AB (ref 65–99)
GLUCOSE: 108 mg/dL — AB (ref 65–99)
GLUCOSE: 98 mg/dL (ref 65–99)
Glucose, Bld: 100 mg/dL — ABNORMAL HIGH (ref 65–99)
POTASSIUM: 4.1 mmol/L (ref 3.5–5.1)
POTASSIUM: 3.7 mmol/L (ref 3.5–5.1)
Potassium: 4.1 mmol/L (ref 3.5–5.1)
Potassium: 4.8 mmol/L (ref 3.5–5.1)
SODIUM: 131 mmol/L — AB (ref 135–145)
Sodium: 129 mmol/L — ABNORMAL LOW (ref 135–145)
Sodium: 130 mmol/L — ABNORMAL LOW (ref 135–145)
Sodium: 132 mmol/L — ABNORMAL LOW (ref 135–145)
TOTAL PROTEIN: 6.2 g/dL — AB (ref 6.5–8.1)
TOTAL PROTEIN: 5.7 g/dL — AB (ref 6.5–8.1)
Total Bilirubin: 0.6 mg/dL (ref 0.3–1.2)
Total Protein: 6.2 g/dL — ABNORMAL LOW (ref 6.5–8.1)
Total Protein: 6.3 g/dL — ABNORMAL LOW (ref 6.5–8.1)

## 2016-11-20 LAB — CBC
HCT: 34 % — ABNORMAL LOW (ref 36.0–46.0)
HCT: 35.6 % — ABNORMAL LOW (ref 36.0–46.0)
HEMATOCRIT: 34.1 % — AB (ref 36.0–46.0)
HEMATOCRIT: 30 % — AB (ref 36.0–46.0)
HEMATOCRIT: 32.4 % — AB (ref 36.0–46.0)
HEMOGLOBIN: 12.4 g/dL (ref 12.0–15.0)
HEMOGLOBIN: 10.9 g/dL — AB (ref 12.0–15.0)
HEMOGLOBIN: 11.8 g/dL — AB (ref 12.0–15.0)
Hemoglobin: 12.4 g/dL (ref 12.0–15.0)
Hemoglobin: 12.9 g/dL (ref 12.0–15.0)
MCH: 33.4 pg (ref 26.0–34.0)
MCH: 33.5 pg (ref 26.0–34.0)
MCH: 33.4 pg (ref 26.0–34.0)
MCH: 33 pg (ref 26.0–34.0)
MCH: 33.6 pg (ref 26.0–34.0)
MCHC: 36.4 g/dL — AB (ref 30.0–36.0)
MCHC: 36.5 g/dL — ABNORMAL HIGH (ref 30.0–36.0)
MCHC: 36.2 g/dL — ABNORMAL HIGH (ref 30.0–36.0)
MCHC: 36.3 g/dL — ABNORMAL HIGH (ref 30.0–36.0)
MCHC: 36.4 g/dL — ABNORMAL HIGH (ref 30.0–36.0)
MCV: 91.9 fL (ref 78.0–100.0)
MCV: 91.9 fL (ref 78.0–100.0)
MCV: 92.2 fL (ref 78.0–100.0)
MCV: 90.9 fL (ref 78.0–100.0)
MCV: 92.3 fL (ref 78.0–100.0)
PLATELETS: 166 10*3/uL (ref 150–400)
Platelets: 138 10*3/uL — ABNORMAL LOW (ref 150–400)
Platelets: 135 10*3/uL — ABNORMAL LOW (ref 150–400)
Platelets: 134 10*3/uL — ABNORMAL LOW (ref 150–400)
Platelets: 16 10*3/uL — CL (ref 150–400)
RBC: 3.71 MIL/uL — ABNORMAL LOW (ref 3.87–5.11)
RBC: 3.7 MIL/uL — AB (ref 3.87–5.11)
RBC: 3.86 MIL/uL — AB (ref 3.87–5.11)
RBC: 3.3 MIL/uL — AB (ref 3.87–5.11)
RBC: 3.51 MIL/uL — AB (ref 3.87–5.11)
RDW: 12.3 % (ref 11.5–15.5)
RDW: 12.7 % (ref 11.5–15.5)
RDW: 12.8 % (ref 11.5–15.5)
RDW: 12.6 % (ref 11.5–15.5)
RDW: 12.7 % (ref 11.5–15.5)
WBC: 8.8 10*3/uL (ref 4.0–10.5)
WBC: 9.2 10*3/uL (ref 4.0–10.5)
WBC: 10.6 10*3/uL — AB (ref 4.0–10.5)
WBC: 11.6 10*3/uL — AB (ref 4.0–10.5)
WBC: 7.1 10*3/uL (ref 4.0–10.5)

## 2016-11-20 LAB — MAGNESIUM
MAGNESIUM: 5.7 mg/dL — AB (ref 1.7–2.4)
Magnesium: 7 mg/dL (ref 1.7–2.4)
Magnesium: 5.9 mg/dL — ABNORMAL HIGH (ref 1.7–2.4)
Magnesium: 5.5 mg/dL — ABNORMAL HIGH (ref 1.7–2.4)

## 2016-11-20 LAB — PREPARE RBC (CROSSMATCH)

## 2016-11-20 LAB — RPR: RPR: NONREACTIVE

## 2016-11-20 SURGERY — Surgical Case
Anesthesia: Epidural | Site: Abdomen

## 2016-11-20 MED ORDER — IBUPROFEN 600 MG PO TABS
600.0000 mg | ORAL_TABLET | Freq: Four times a day (QID) | ORAL | Status: DC
  Administered 2016-11-20 – 2016-11-23 (×10): 600 mg via ORAL
  Filled 2016-11-20 (×11): qty 1

## 2016-11-20 MED ORDER — TERBUTALINE SULFATE 1 MG/ML IJ SOLN: 0.2500 mg | Freq: Once | INTRAMUSCULAR | Status: DC | PRN

## 2016-11-20 MED ORDER — LIDOCAINE HCL (PF) 1 % IJ SOLN
INTRAMUSCULAR | Status: DC | PRN
  Administered 2016-11-19: 4 mL via EPIDURAL
  Administered 2016-11-19: 5 mL via EPIDURAL

## 2016-11-20 MED ORDER — NALOXONE HCL 0.4 MG/ML IJ SOLN: 0.4000 mg | INTRAMUSCULAR | Status: DC | PRN

## 2016-11-20 MED ORDER — NALBUPHINE HCL 10 MG/ML IJ SOLN: 5.0000 mg | Freq: Once | INTRAMUSCULAR | Status: DC | PRN

## 2016-11-20 MED ORDER — PHENYLEPHRINE 40 MCG/ML (10ML) SYRINGE FOR IV PUSH (FOR BLOOD PRESSURE SUPPORT)
PREFILLED_SYRINGE | INTRAVENOUS | Status: AC
  Filled 2016-11-20: qty 10

## 2016-11-20 MED ORDER — FENTANYL CITRATE (PF) 100 MCG/2ML IJ SOLN
INTRAMUSCULAR | Status: AC
  Administered 2016-11-20: 25 ug via INTRAVENOUS
  Filled 2016-11-20: qty 2

## 2016-11-20 MED ORDER — LIDOCAINE-EPINEPHRINE (PF) 2 %-1:200000 IJ SOLN: INTRAMUSCULAR | Status: DC | PRN

## 2016-11-20 MED ORDER — EPHEDRINE 5 MG/ML INJ
INTRAVENOUS | Status: AC
  Filled 2016-11-20: qty 10

## 2016-11-20 MED ORDER — METOCLOPRAMIDE HCL 5 MG/ML IJ SOLN
10.0000 mg | Freq: Once | INTRAMUSCULAR | Status: DC | PRN
Start: 2016-11-20 — End: 2016-11-20

## 2016-11-20 MED ORDER — SIMETHICONE 80 MG PO CHEW
80.0000 mg | CHEWABLE_TABLET | ORAL | Status: DC | PRN
  Administered 2016-11-22: 80 mg via ORAL

## 2016-11-20 MED ORDER — CARBOPROST TROMETHAMINE 250 MCG/ML IM SOLN
INTRAMUSCULAR | Status: AC
  Filled 2016-11-20: qty 1

## 2016-11-20 MED ORDER — OXYTOCIN 10 UNIT/ML IJ SOLN
INTRAVENOUS | Status: DC | PRN
  Administered 2016-11-20: 40 [IU] via INTRAVENOUS

## 2016-11-20 MED ORDER — METOCLOPRAMIDE HCL 5 MG/ML IJ SOLN
INTRAMUSCULAR | Status: DC | PRN
  Administered 2016-11-20 (×2): 5 mg via INTRAVENOUS

## 2016-11-20 MED ORDER — CEFAZOLIN SODIUM-DEXTROSE 2-4 GM/100ML-% IV SOLN
INTRAVENOUS | Status: AC
  Filled 2016-11-20: qty 100

## 2016-11-20 MED ORDER — CARBOPROST TROMETHAMINE 250 MCG/ML IM SOLN
INTRAMUSCULAR | Status: DC | PRN
  Administered 2016-11-20: 250 ug via INTRAMUSCULAR

## 2016-11-20 MED ORDER — MORPHINE SULFATE-NACL 0.5-0.9 MG/ML-% IV SOSY
PREFILLED_SYRINGE | INTRAVENOUS | Status: DC | PRN
  Administered 2016-11-20: 3 mg via EPIDURAL

## 2016-11-20 MED ORDER — NALBUPHINE HCL 10 MG/ML IJ SOLN: 5.0000 mg | INTRAMUSCULAR | Status: DC | PRN

## 2016-11-20 MED ORDER — SODIUM CHLORIDE 0.9 % IR SOLN
Status: DC | PRN
  Administered 2016-11-20: 1000 mL

## 2016-11-20 MED ORDER — MEPERIDINE HCL 25 MG/ML IJ SOLN: 6.2500 mg | INTRAMUSCULAR | Status: DC | PRN

## 2016-11-20 MED ORDER — EPHEDRINE SULFATE 50 MG/ML IJ SOLN
INTRAMUSCULAR | Status: DC | PRN
  Administered 2016-11-20 (×2): 10 mg via INTRAVENOUS

## 2016-11-20 MED ORDER — SIMETHICONE 80 MG PO CHEW
80.0000 mg | CHEWABLE_TABLET | ORAL | Status: DC
  Administered 2016-11-20 – 2016-11-22 (×3): 80 mg via ORAL
  Filled 2016-11-20 (×3): qty 1

## 2016-11-20 MED ORDER — SODIUM BICARBONATE 8.4 % IV SOLN
INTRAVENOUS | Status: AC
  Filled 2016-11-20: qty 50

## 2016-11-20 MED ORDER — PHENYLEPHRINE HCL 10 MG/ML IJ SOLN
INTRAMUSCULAR | Status: DC | PRN
  Administered 2016-11-20 (×5): 80 ug via INTRAVENOUS

## 2016-11-20 MED ORDER — FENTANYL CITRATE (PF) 100 MCG/2ML IJ SOLN
25.0000 ug | INTRAMUSCULAR | Status: DC | PRN
  Administered 2016-11-20: 25 ug via INTRAVENOUS
  Administered 2016-11-20: 50 ug via INTRAVENOUS

## 2016-11-20 MED ORDER — METOCLOPRAMIDE HCL 5 MG/ML IJ SOLN
INTRAMUSCULAR | Status: AC
  Filled 2016-11-20: qty 2

## 2016-11-20 MED ORDER — SENNOSIDES-DOCUSATE SODIUM 8.6-50 MG PO TABS
2.0000 | ORAL_TABLET | ORAL | Status: DC
  Administered 2016-11-20 – 2016-11-22 (×3): 2 via ORAL
  Filled 2016-11-20 (×3): qty 2

## 2016-11-20 MED ORDER — DIPHENHYDRAMINE HCL 25 MG PO CAPS: 25.0000 mg | ORAL_CAPSULE | ORAL | Status: DC | PRN

## 2016-11-20 MED ORDER — LIDOCAINE-EPINEPHRINE (PF) 2 %-1:200000 IJ SOLN
INTRAMUSCULAR | Status: DC | PRN
  Administered 2016-11-20 (×3): 5 mL via EPIDURAL

## 2016-11-20 MED ORDER — ACETAMINOPHEN 325 MG PO TABS: 650.0000 mg | ORAL_TABLET | ORAL | Status: DC | PRN

## 2016-11-20 MED ORDER — LIDOCAINE-EPINEPHRINE (PF) 2 %-1:200000 IJ SOLN
INTRAMUSCULAR | Status: AC
  Filled 2016-11-20: qty 20

## 2016-11-20 MED ORDER — SODIUM CHLORIDE 0.9 % IV SOLN: Freq: Once | INTRAVENOUS | Status: DC

## 2016-11-20 MED ORDER — WITCH HAZEL-GLYCERIN EX PADS: 1.0000 "application " | MEDICATED_PAD | CUTANEOUS | Status: DC | PRN

## 2016-11-20 MED ORDER — LACTATED RINGERS IV SOLN
INTRAVENOUS | Status: DC
  Administered 2016-11-21: 13:00:00 via INTRAVENOUS
  Administered 2016-11-21: 125 mL/h via INTRAVENOUS

## 2016-11-20 MED ORDER — MORPHINE SULFATE (PF) 0.5 MG/ML IJ SOLN
INTRAMUSCULAR | Status: AC
  Filled 2016-11-20: qty 10

## 2016-11-20 MED ORDER — NALOXONE HCL 2 MG/2ML IJ SOSY
1.0000 ug/kg/h | PREFILLED_SYRINGE | INTRAMUSCULAR | Status: DC | PRN
  Filled 2016-11-20: qty 2

## 2016-11-20 MED ORDER — DIBUCAINE 1 % RE OINT: 1.0000 "application " | TOPICAL_OINTMENT | RECTAL | Status: DC | PRN

## 2016-11-20 MED ORDER — ONDANSETRON HCL 4 MG/2ML IJ SOLN
INTRAMUSCULAR | Status: DC | PRN
  Administered 2016-11-20: 4 mg via INTRAVENOUS

## 2016-11-20 MED ORDER — OXYTOCIN 40 UNITS IN LACTATED RINGERS INFUSION - SIMPLE MED
1.0000 m[IU]/min | INTRAVENOUS | Status: DC
  Administered 2016-11-20: 2 m[IU]/min via INTRAVENOUS
  Filled 2016-11-20: qty 1000

## 2016-11-20 MED ORDER — SODIUM CHLORIDE 0.9% FLUSH
3.0000 mL | INTRAVENOUS | Status: DC | PRN
  Administered 2016-11-22 (×2): 3 mL via INTRAVENOUS
  Filled 2016-11-20 (×2): qty 3

## 2016-11-20 MED ORDER — ONDANSETRON HCL 4 MG/2ML IJ SOLN
4.0000 mg | Freq: Three times a day (TID) | INTRAMUSCULAR | Status: DC | PRN
  Administered 2016-11-21 (×2): 4 mg via INTRAVENOUS
  Filled 2016-11-20 (×2): qty 2

## 2016-11-20 MED ORDER — SIMETHICONE 80 MG PO CHEW
80.0000 mg | CHEWABLE_TABLET | Freq: Three times a day (TID) | ORAL | Status: DC
  Administered 2016-11-21 – 2016-11-23 (×6): 80 mg via ORAL
  Filled 2016-11-20 (×6): qty 1

## 2016-11-20 MED ORDER — ZOLPIDEM TARTRATE 5 MG PO TABS: 5.0000 mg | ORAL_TABLET | Freq: Every evening | ORAL | Status: DC | PRN

## 2016-11-20 MED ORDER — DIPHENHYDRAMINE HCL 25 MG PO CAPS: 25.0000 mg | ORAL_CAPSULE | Freq: Four times a day (QID) | ORAL | Status: DC | PRN

## 2016-11-20 MED ORDER — CEFAZOLIN SODIUM-DEXTROSE 2-4 GM/100ML-% IV SOLN
2.0000 g | Freq: Once | INTRAVENOUS | Status: AC
  Administered 2016-11-20: 2 g via INTRAVENOUS

## 2016-11-20 MED ORDER — COCONUT OIL OIL
1.0000 "application " | TOPICAL_OIL | Status: DC | PRN
  Administered 2016-11-21: 1 via TOPICAL
  Filled 2016-11-20: qty 120

## 2016-11-20 MED ORDER — MENTHOL 3 MG MT LOZG: 1.0000 | LOZENGE | OROMUCOSAL | Status: DC | PRN

## 2016-11-20 MED ORDER — PRENATAL MULTIVITAMIN CH
1.0000 | ORAL_TABLET | Freq: Every day | ORAL | Status: DC
  Administered 2016-11-22 – 2016-11-23 (×2): 1 via ORAL
  Filled 2016-11-20 (×2): qty 1

## 2016-11-20 MED ORDER — DIPHENHYDRAMINE HCL 50 MG/ML IJ SOLN: 12.5000 mg | INTRAMUSCULAR | Status: DC | PRN

## 2016-11-20 MED ORDER — ONDANSETRON HCL 4 MG/2ML IJ SOLN
INTRAMUSCULAR | Status: AC
  Filled 2016-11-20: qty 2

## 2016-11-20 MED ORDER — FENTANYL CITRATE (PF) 100 MCG/2ML IJ SOLN
INTRAMUSCULAR | Status: AC
  Filled 2016-11-20: qty 2

## 2016-11-20 MED ORDER — OXYTOCIN 40 UNITS IN LACTATED RINGERS INFUSION - SIMPLE MED: 2.5000 [IU]/h | INTRAVENOUS | Status: AC

## 2016-11-20 MED ORDER — OXYTOCIN 10 UNIT/ML IJ SOLN
INTRAMUSCULAR | Status: AC
Start: 2016-11-20 — End: 2016-11-20
  Filled 2016-11-20: qty 4

## 2016-11-20 MED ORDER — ACETAMINOPHEN 500 MG PO TABS
1000.0000 mg | ORAL_TABLET | Freq: Four times a day (QID) | ORAL | Status: AC
  Administered 2016-11-20 – 2016-11-21 (×3): 1000 mg via ORAL
  Filled 2016-11-20 (×4): qty 2

## 2016-11-20 MED ORDER — LACTATED RINGERS IV SOLN
INTRAVENOUS | Status: DC | PRN
  Administered 2016-11-20: 19:00:00 via INTRAVENOUS

## 2016-11-20 SURGICAL SUPPLY — 37 items
BARRIER ADHS 3X4 INTERCEED (GAUZE/BANDAGES/DRESSINGS) ×2 IMPLANT
BENZOIN TINCTURE PRP APPL 2/3 (GAUZE/BANDAGES/DRESSINGS) ×2 IMPLANT
CHLORAPREP W/TINT 26ML (MISCELLANEOUS) ×2 IMPLANT
CLAMP CORD UMBIL (MISCELLANEOUS) ×2 IMPLANT
CLOTH BEACON ORANGE TIMEOUT ST (SAFETY) ×2 IMPLANT
DERMABOND ADVANCED (GAUZE/BANDAGES/DRESSINGS)
DERMABOND ADVANCED .7 DNX12 (GAUZE/BANDAGES/DRESSINGS) IMPLANT
DRSG OPSITE POSTOP 4X10 (GAUZE/BANDAGES/DRESSINGS) ×2 IMPLANT
ELECT REM PT RETURN 9FT ADLT (ELECTROSURGICAL) ×2
ELECTRODE REM PT RTRN 9FT ADLT (ELECTROSURGICAL) ×1 IMPLANT
EXTRACTOR VACUUM KIWI (MISCELLANEOUS) IMPLANT
GLOVE BIOGEL PI IND STRL 6.5 (GLOVE) ×3 IMPLANT
GLOVE BIOGEL PI IND STRL 7.0 (GLOVE) ×1 IMPLANT
GLOVE BIOGEL PI INDICATOR 6.5 (GLOVE) ×3
GLOVE BIOGEL PI INDICATOR 7.0 (GLOVE) ×1
GLOVE ECLIPSE 6.5 STRL STRAW (GLOVE) ×6 IMPLANT
GOWN STRL REUS W/TWL LRG LVL3 (GOWN DISPOSABLE) ×6 IMPLANT
KIT ABG SYR 3ML LUER SLIP (SYRINGE) ×2 IMPLANT
NEEDLE HYPO 25X5/8 SAFETYGLIDE (NEEDLE) ×2 IMPLANT
NS IRRIG 1000ML POUR BTL (IV SOLUTION) ×2 IMPLANT
PACK C SECTION WH (CUSTOM PROCEDURE TRAY) ×2 IMPLANT
PAD ABD 7.5X8 STRL (GAUZE/BANDAGES/DRESSINGS) ×2 IMPLANT
PAD OB MATERNITY 4.3X12.25 (PERSONAL CARE ITEMS) ×2 IMPLANT
PENCIL SMOKE EVAC W/HOLSTER (ELECTROSURGICAL) ×2 IMPLANT
RTRCTR C-SECT PINK 25CM LRG (MISCELLANEOUS) ×2 IMPLANT
STRIP CLOSURE SKIN 1/2X4 (GAUZE/BANDAGES/DRESSINGS) ×2 IMPLANT
SUT PLAIN 0 NONE (SUTURE) IMPLANT
SUT PLAIN 2 0 XLH (SUTURE) IMPLANT
SUT VIC AB 0 CT1 27 (SUTURE) ×2
SUT VIC AB 0 CT1 27XBRD ANBCTR (SUTURE) ×2 IMPLANT
SUT VIC AB 0 CTX 36 (SUTURE) ×3
SUT VIC AB 0 CTX36XBRD ANBCTRL (SUTURE) ×3 IMPLANT
SUT VIC AB 2-0 CT1 27 (SUTURE) ×1
SUT VIC AB 2-0 CT1 TAPERPNT 27 (SUTURE) ×1 IMPLANT
SUT VIC AB 4-0 KS 27 (SUTURE) ×2 IMPLANT
TOWEL OR 17X24 6PK STRL BLUE (TOWEL DISPOSABLE) ×2 IMPLANT
TRAY FOLEY BAG SILVER LF 14FR (SET/KITS/TRAYS/PACK) IMPLANT

## 2016-11-20 NOTE — Op Note (Signed)
PreOp Diagnosis:  -Intrauterine pregnancy @ [redacted]w[redacted]d -HELLP Syndrome -Arrest of dilation PostOp Diagnosis: same Procedure: Primary LTCS Surgeon: Dr. Janyth Pupa Assistant: Claretta Fraise, (assistant then substituted for ) Irene Shipper, CNM Anesthesia: epidural Complications: none EBL: 800cc UOP: 100cc Fluids: 1000cc  Findings: Female infant from vertex presentation, small uterine septum noted, normal tubes and ovaries bilaterally.  PROCEDURE:  Informed consent was obtained from the patient with risks, benefits, complications, treatment options, and expected outcomes discussed with the patient.  The patient concurred with the proposed plan, giving informed consent with form signed.   The patient was taken to Operating Room, and identified with the procedure verified as C-Section Delivery with Time Out. With induction of anesthesia, the patient was prepped and draped in the usual sterile fashion. A Pfannenstiel incision was made and carried down through the subcutaneous tissue to the fascia. The fascia was incised in the midline and extended transversely. The superior aspect of the fascial incision was grasped with Kochers elevated and the underlying muscle dissected off. The inferior aspect of the facial incision was in similar fashion, grasped elevated and rectus muscles dissected off. The peritoneum was identified and entered. Peritoneal incision was extended longitudinally. The utero-vesical peritoneal reflection was identified and incised transversely with the [facility] scissors, the incision extended laterally, the bladder flap created digitally. A low transverse uterine incision was made and the infants head delivered atraumatically. After the umbilical cord was clamped and cut cord blood was obtained for evaluation. The placenta was removed intact and appeared normal. The uterine outline as mentioned above with thin septum noted- did not extend down to the cervix, upper 2/3 of uterus.  Normal  tubes and ovaries appeared normal. The uterine incision was closed with running locked sutures of 0 Vicryl and a second layer of the same stitch was used in an imbricating fashion.  Due to atony, IM Hemabate was given. Excellent hemostasis was obtained.  The pericolic gutters were then cleared of all clots and debris. Interceed was placed.  The peritoneum was closed in a running fashion. The fascia was then reapproximated with running sutures of 0 Vicryl. The skin was closed with 4-0 vicryl in a subcuticular fashion.  Instrument, sponge, and needle counts were correct prior the abdominal closure and at the conclusion of the case. The patient was taken to recovery in stable condition.  Janyth Pupa, DO [phone] (pager) [phone] (office)

## 2016-11-20 NOTE — Transfer of Care (Signed)
Immediate Anesthesia Transfer of Care Note  Patient: Beverly Gonzalez  Procedure(s) Performed: Procedure(s): CESAREAN SECTION (N/A)  Patient Location: PACU  Anesthesia Type:Epidural  Level of Consciousness: drowsy  Airway & Oxygen Therapy: Patient Spontanous Breathing  Post-op Assessment: Report given to RN  Post vital signs: Reviewed and stable  Last Vitals:  Vitals:   11/20/16 1743 11/20/16 1801  BP: 122/75 118/72  Pulse: (!) 57 82  Resp: 18 20  Temp:      Last Pain:  Vitals:   11/20/16 1201  TempSrc:   PainSc: 0-No pain      Patients Stated Pain Goal: 3 (64/84/72 0721)  Complications: No apparent anesthesia complications

## 2016-11-20 NOTE — Progress Notes (Signed)
Olam Idler [facility] [place] [id number]  Subjective: Strip and Chart Reviewed. Magnesium Level Resulted.   Objective:  Vitals:   11/19/16 2001 11/19/16 2101 11/19/16 2201 11/19/16 2301  BP: 139/78 132/70 130/72 114/71  Pulse: 70 70 81 80  Resp: 18 18 18 18   Temp:    98.6 F (37 C)  TempSrc:    Oral  SpO2:      Weight:      Height:       MgSO4 Level: 6.9  FHR: 125 bpm, Mod Var, +Variable Decels-Occasional, +Accels UC: Occassional  Assessment: IUP at [redacted]w[redacted]d Cat I FT-Overall HELLP Syndrome Cervical Ripening  Plan: -Dr. Octavio Manns consulted and advised decrease MgSO4 from 2grams to 1 gram -Repeat MgSO4 level with next set of 8 hour labs-0100 -Plan for cytotec dose at 0045 unless cervix favorable for foley.   Milinda Cave, CNM 11/20/2016 12:01 AM

## 2016-11-20 NOTE — Progress Notes (Signed)
OB PN:  S: Resting comfortably with epidural.  No acute complaints.  O: BP 122/78   Pulse 62   Temp 99 F (37.2 C)   Resp 16   Ht 5\' 10"  (1.778 m)   Wt 168 lb (76.2 kg)   LMP 04/08/2016   SpO2 100%   BMI 24.11 kg/m   FHT: 120, moderate variability, no accels, no decels Toco: q-23min SVE: unchanged- 1/70/-2  CBC Latest Ref Rng & Units 11/20/2016 11/20/2016 11/19/2016  WBC 4.0 - 10.5 K/uL 9.2 8.8 7.4  Hemoglobin 12.0 - 15.0 g/dL 12.4 12.4 15.0  Hematocrit 36.0 - 46.0 % 34.0(L) 34.1(L) 42.4  Platelets 150 - 400 K/uL 135(L) 138(L) 123(L)   CMP Latest Ref Rng & Units 11/20/2016 11/20/2016 11/19/2016  Glucose 65 - 99 mg/dL 108(H) 132(H) 134(H)  BUN 6 - 20 mg/dL 17 15 14   Creatinine 0.44 - 1.00 mg/dL 0.91 0.90 0.85  Sodium 135 - 145 mmol/L 131(L) 129(L) 132(L)  Potassium 3.5 - 5.1 mmol/L 4.1 4.1 3.7  Chloride 101 - 111 mmol/L 101 99(L) 100(L)  CO2 22 - 32 mmol/L 22 20(L) 20(L)  Calcium 8.9 - 10.3 mg/dL 6.7(L) 6.7(L) 7.5(L)  Total Protein 6.5 - 8.1 g/dL 6.2(L) 6.2(L) 6.9  Total Bilirubin 0.3 - 1.2 mg/dL 0.6 0.8 0.8  Alkaline Phos 38 - 126 U/L 161(H) 172(H) 205(H)  AST 15 - 41 U/L 140(H) 152(H) 200(H)  ALT 14 - 54 U/L 264(H) 287(H) 349(H)    A/P: 31yo G1P0@ [redacted]w[redacted]d for IOL due to HELLP   -FWB: Cat. I -Induction: currently on Pit; however no further dilation has ben made.  Reviewed risk/benefit of continued labor vs primary C-section.  Risk benefits and alternatives of cesarean section were discussed with the patient including but not limited to infection, bleeding, damage to bowel , bladder and baby with the need for further surgery. Pt voiced understanding and desires to proceed.  -HELLP: continue IV Magnesium, labs stable as above, continue q 8hr, pt asymptomatic -Pain management: continue with epidural -GBS: continue PCN per protocol  Janyth Pupa, DO [phone] (pager) [phone] (office)

## 2016-11-20 NOTE — Progress Notes (Signed)
OB PN:  S: Pt resting comfortably, headache improved.  No change in vision, no RUQ pain.  Starting to feel increased cramping.  O: BP 128/74   Pulse (!) 59   Temp 98.6 F (37 C) (Oral)   Resp 16   Ht 5\' 10"  (1.778 m)   Wt 168 lb (76.2 kg)   LMP 04/08/2016   SpO2 100%   BMI 24.11 kg/m   FHT: 120, moderate variability, +accels, no decels Toco: occasional contraction SVE: 1/50/-3, Cook balloon placed with 50cc  CBC Latest Ref Rng & Units 11/20/2016 11/19/2016 11/19/2016  WBC 4.0 - 10.5 K/uL 8.8 7.4 7.1  Hemoglobin 12.0 - 15.0 g/dL 12.4 15.0 13.3  Hematocrit 36.0 - 46.0 % 34.1(L) 42.4 36.0  Platelets 150 - 400 K/uL 138(L) 123(L) 114(L)   CMP Latest Ref Rng & Units 11/20/2016 11/19/2016 11/19/2016  Glucose 65 - 99 mg/dL 132(H) 134(H) 111(H)  BUN 6 - 20 mg/dL 15 14 13   Creatinine 0.44 - 1.00 mg/dL 0.90 0.85 0.79  Sodium 135 - 145 mmol/L 129(L) 132(L) 131(L)  Potassium 3.5 - 5.1 mmol/L 4.1 3.7 3.9  Chloride 101 - 111 mmol/L 99(L) 100(L) 100(L)  CO2 22 - 32 mmol/L 20(L) 20(L) 19(L)  Calcium 8.9 - 10.3 mg/dL 6.7(L) 7.5(L) 7.7(L)  Total Protein 6.5 - 8.1 g/dL 6.2(L) 6.9 6.9  Total Bilirubin 0.3 - 1.2 mg/dL 0.8 0.8 0.9  Alkaline Phos 38 - 126 U/L 172(H) 205(H) 188(H)  AST 15 - 41 U/L 152(H) 200(H) 222(H)  ALT 14 - 54 U/L 287(H) 349(H) 351(H)    A/P: 31yo G1P0@ [redacted]w[redacted]d for IOL due to HELLP   -FWB: Cat. I -Induction: Cook placed, plan to start Pit per protocol -HELLP: Magnesium decreased to 1g/hr, last level improved @ 5.8, BP within normal limits, labs stable as above, continue q 8hr -Pain management: dry cath placed -GBS: plan to start PCN per protocol  Janyth Pupa, DO [phone] (pager) [phone] (office)

## 2016-11-20 NOTE — Progress Notes (Signed)
OB PN:  S: Pt resting comfortably, headache improved.  No change in vision, no RUQ pain.  Starting to feel increased cramping.  O: BP 118/74   Pulse 76   Temp 98 F (36.7 C) (Oral)   Resp 18   Ht 5\' 10"  (1.778 m)   Wt 168 lb (76.2 kg)   LMP 04/08/2016   SpO2 100%   BMI 24.11 kg/m   FHT: 120, moderate variability, +accels, no decels Toco: occasional contraction SVE: deferred, per RN @ 0500: 0.5/50/-3  CBC Latest Ref Rng & Units 11/20/2016 11/19/2016 11/19/2016  WBC 4.0 - 10.5 K/uL 8.8 7.4 7.1  Hemoglobin 12.0 - 15.0 g/dL 12.4 15.0 13.3  Hematocrit 36.0 - 46.0 % 34.1(L) 42.4 36.0  Platelets 150 - 400 K/uL 138(L) 123(L) 114(L)   CMP Latest Ref Rng & Units 11/20/2016 11/19/2016 11/19/2016  Glucose 65 - 99 mg/dL 132(H) 134(H) 111(H)  BUN 6 - 20 mg/dL 15 14 13   Creatinine 0.44 - 1.00 mg/dL 0.90 0.85 0.79  Sodium 135 - 145 mmol/L 129(L) 132(L) 131(L)  Potassium 3.5 - 5.1 mmol/L 4.1 3.7 3.9  Chloride 101 - 111 mmol/L 99(L) 100(L) 100(L)  CO2 22 - 32 mmol/L 20(L) 20(L) 19(L)  Calcium 8.9 - 10.3 mg/dL 6.7(L) 7.5(L) 7.7(L)  Total Protein 6.5 - 8.1 g/dL 6.2(L) 6.9 6.9  Total Bilirubin 0.3 - 1.2 mg/dL 0.8 0.8 0.9  Alkaline Phos 38 - 126 U/L 172(H) 205(H) 188(H)  AST 15 - 41 U/L 152(H) 200(H) 222(H)  ALT 14 - 54 U/L 287(H) 349(H) 351(H)    A/P: 31yo G1P0@ [redacted]w[redacted]d for IOL due to HELLP   -FWB: Cat. I -Induction: last cytotec placed this am, plan to transition to Pit and potentially Cook balloon -HELLP: Magnesium decreased to 1g/hr, last level improved @ 5.8, BP within normal limits, labs stable as above, continue q 8hr -Pain management: dry cath placed -GBS still pending, will plan to start PCN if rapid progression of dilation, rupture of membranes or Foley balloon placed  Janyth Pupa, DO [phone] (pager) [phone] (office)

## 2016-11-20 NOTE — Progress Notes (Addendum)
OB PN:  S: Feeling much better with epidural.  O: BP 126/78   Pulse (!) 52   Temp 98.6 F (37 C)   Resp 16   Ht 5\' 10"  (1.778 m)   Wt 168 lb (76.2 kg)   LMP 04/08/2016   SpO2 100%   BMI 24.11 kg/m   FHT: 120, moderate variability, +accels, no decels Toco: irregular SVE: Cook well in place, 1/70/-3  CBC Latest Ref Rng & Units 11/20/2016 11/20/2016 11/19/2016  WBC 4.0 - 10.5 K/uL 9.2 8.8 7.4  Hemoglobin 12.0 - 15.0 g/dL 12.4 12.4 15.0  Hematocrit 36.0 - 46.0 % 34.0(L) 34.1(L) 42.4  Platelets 150 - 400 K/uL 135(L) 138(L) 123(L)   CMP Latest Ref Rng & Units 11/20/2016 11/20/2016 11/19/2016  Glucose 65 - 99 mg/dL 108(H) 132(H) 134(H)  BUN 6 - 20 mg/dL 17 15 14   Creatinine 0.44 - 1.00 mg/dL 0.91 0.90 0.85  Sodium 135 - 145 mmol/L 131(L) 129(L) 132(L)  Potassium 3.5 - 5.1 mmol/L 4.1 4.1 3.7  Chloride 101 - 111 mmol/L 101 99(L) 100(L)  CO2 22 - 32 mmol/L 22 20(L) 20(L)  Calcium 8.9 - 10.3 mg/dL 6.7(L) 6.7(L) 7.5(L)  Total Protein 6.5 - 8.1 g/dL 6.2(L) 6.2(L) 6.9  Total Bilirubin 0.3 - 1.2 mg/dL 0.6 0.8 0.8  Alkaline Phos 38 - 126 U/L 161(H) 172(H) 205(H)  AST 15 - 41 U/L 140(H) 152(H) 200(H)  ALT 14 - 54 U/L 264(H) 287(H) 349(H)    A/P: 31yo G1P0@ [redacted]w[redacted]d for IOL due to HELLP   -FWB: Cat. I -Induction: Cook placed, continue Pit per protocol -HELLP: continue IV Magnesium, labs stable as above, continue q 8hr, pt asymptomatic -Pain management: continue with epidural -GBS: continue PCN per protocol  Janyth Pupa, DO [phone] (pager) [phone] (office)

## 2016-11-20 NOTE — Anesthesia Postprocedure Evaluation (Signed)
Anesthesia Post Note  Patient: Beverly Gonzalez  Procedure(s) Performed: Procedure(s) (LRB): CESAREAN SECTION (N/A)     Anesthesia Post Evaluation  Last Vitals:  Vitals:   11/20/16 2145 11/20/16 2214  BP: (!) 132/92 133/82  Pulse: (!) 59 (!) 56  Resp: 19 16  Temp:  36.6 C    Last Pain:  Vitals:   11/20/16 2214  TempSrc: Oral  PainSc:    Pain Goal: Patients Stated Pain Goal: 3 (11/19/16 0700)               Latoya Maulding A.

## 2016-11-21 ENCOUNTER — Encounter (HOSPITAL_COMMUNITY): Payer: Self-pay | Admitting: Obstetrics & Gynecology

## 2016-11-21 LAB — COMPREHENSIVE METABOLIC PANEL
ALK PHOS: 137 U/L — AB (ref 38–126)
ALT: 189 U/L — AB (ref 14–54)
AST: 102 U/L — ABNORMAL HIGH (ref 15–41)
Albumin: 2.5 g/dL — ABNORMAL LOW (ref 3.5–5.0)
Anion gap: 7 (ref 5–15)
BUN: 19 mg/dL (ref 6–20)
CALCIUM: 6.8 mg/dL — AB (ref 8.9–10.3)
CHLORIDE: 104 mmol/L (ref 101–111)
CO2: 24 mmol/L (ref 22–32)
CREATININE: 1.03 mg/dL — AB (ref 0.44–1.00)
GFR calc non Af Amer: >60 mL/min (ref >60)
Glucose, Bld: 102 mg/dL — ABNORMAL HIGH (ref 65–99)
Potassium: 4.4 mmol/L (ref 3.5–5.1)
Sodium: 135 mmol/L (ref 135–145)
Total Bilirubin: 0.4 mg/dL (ref 0.3–1.2)
Total Protein: 5.4 g/dL — ABNORMAL LOW (ref 6.5–8.1)

## 2016-11-21 LAB — CBC
HCT: 31.2 % — ABNORMAL LOW (ref 36.0–46.0)
Hemoglobin: 11.3 g/dL — ABNORMAL LOW (ref 12.0–15.0)
MCH: 33.4 pg (ref 26.0–34.0)
MCHC: 36.2 g/dL — AB (ref 30.0–36.0)
MCV: 92.3 fL (ref 78.0–100.0)
PLATELETS: 140 10*3/uL — AB (ref 150–400)
RBC: 3.38 MIL/uL — AB (ref 3.87–5.11)
RDW: 12.8 % (ref 11.5–15.5)
WBC: 10.7 10*3/uL — ABNORMAL HIGH (ref 4.0–10.5)

## 2016-11-21 LAB — MAGNESIUM: Magnesium: 5.3 mg/dL — ABNORMAL HIGH (ref 1.7–2.4)

## 2016-11-21 MED ORDER — OXYCODONE-ACETAMINOPHEN 5-325 MG PO TABS
1.0000 | ORAL_TABLET | Freq: Four times a day (QID) | ORAL | Status: DC | PRN
  Administered 2016-11-22 (×3): 1 via ORAL
  Filled 2016-11-21 (×3): qty 1

## 2016-11-21 MED ORDER — SODIUM CHLORIDE 0.9% FLUSH
3.0000 mL | Freq: Two times a day (BID) | INTRAVENOUS | Status: DC
  Administered 2016-11-21 – 2016-11-22 (×3): 3 mL via INTRAVENOUS

## 2016-11-21 NOTE — Addendum Note (Signed)
Addendum  created 11/21/16 0734 by Talbot Grumbling, CRNA   Sign clinical note

## 2016-11-21 NOTE — Anesthesia Postprocedure Evaluation (Signed)
Anesthesia Post Note  Patient: Beverly Gonzalez  Procedure(s) Performed: Procedure(s) (LRB): CESAREAN SECTION (N/A)     Anesthesia Post Evaluation  Last Vitals:  Vitals:   11/21/16 0615 11/21/16 0715  BP:    Pulse:    Resp: 18 18  Temp:      Last Pain:  Vitals:   11/21/16 0715  TempSrc:   PainSc: 0-No pain   Pain Goal: Patients Stated Pain Goal: 4 (11/21/16 0715)               [facility]

## 2016-11-21 NOTE — Anesthesia Postprocedure Evaluation (Signed)
Anesthesia Post Note  Patient: Beverly Gonzalez  Procedure(s) Performed: Procedure(s) (LRB): CESAREAN SECTION (N/A)     Patient location during evaluation: [facility] Anesthesia Type: Epidural Level of consciousness: awake and alert Pain management: pain level controlled Vital Signs Assessment: post-procedure vital signs reviewed and stable Respiratory status: spontaneous breathing Cardiovascular status: blood pressure returned to baseline Postop Assessment: no headache, no backache, patient able to bend at knees, no signs of nausea or vomiting and adequate PO intake Anesthetic complications: no    Last Vitals:  Vitals:   11/21/16 0615 11/21/16 0715  BP:    Pulse:    Resp: 18 18  Temp:      Last Pain:  Vitals:   11/21/16 0715  TempSrc:   PainSc: 0-No pain   Pain Goal: Patients Stated Pain Goal: 4 (11/21/16 0715)               [facility]

## 2016-11-21 NOTE — Addendum Note (Signed)
Addendum  created 11/21/16 3893 by Talbot Grumbling, CRNA   Sign clinical note

## 2016-11-21 NOTE — Progress Notes (Signed)
Postpartum Note Day # 1  S:  Patient resting comfortable in bed.  Pain controlled.  Tolerating clears, not yet had regular diet. + flatus, no BM.  Lochia minimal.  Pt not yet ambulated, noted some dizziness with standing earlier this am.  She denies n/v/f/c, SOB, or CP.  Pt plans on breastfeeding.  O: Temp:  [97.5 F (36.4 C)-99 F (37.2 C)] 98.3 F (36.8 C) (06/07 0515) Pulse Rate:  [50-82] 63 (06/07 0515) Resp:  [12-20] 16 (06/07 0515) BP: (115-145)/(68-92) 118/70 (06/07 0315) SpO2:  [92 %-100 %] 98 % (06/07 0515)   Gen: A&Ox3, NAD CV: RRR Resp: CTAB Abdomen: soft, NT, ND +BS Uterus: firm, non-tender, below umbilicus Incision: c/d/i, bandage on Ext: No edema, no calf tenderness bilaterally, SCDs in place  Labs:  CBC Latest Ref Rng & Units 11/21/2016 11/20/2016 11/20/2016  WBC 4.0 - 10.5 K/uL 10.7(H) 11.6(H) 7.1  Hemoglobin 12.0 - 15.0 g/dL 11.3(L) 11.8(L) 10.9(L)  Hematocrit 36.0 - 46.0 % 31.2(L) 32.4(L) 30.0(L)  Platelets 150 - 400 K/uL 140(L) 134(L) 16(LL)   CMP Latest Ref Rng & Units 11/21/2016 11/20/2016 11/20/2016  Glucose 65 - 99 mg/dL 102(H) 98 100(H)  BUN 6 - 20 mg/dL 19 21(H) 21(H)  Creatinine 0.44 - 1.00 mg/dL 1.03(H) 1.01(H) 1.05(H)  Sodium 135 - 145 mmol/L 135 132(L) 130(L)  Potassium 3.5 - 5.1 mmol/L 4.4 3.7 4.8  Chloride 101 - 111 mmol/L 104 102 100(L)  CO2 22 - 32 mmol/L 24 23 23   Calcium 8.9 - 10.3 mg/dL 6.8(L) 6.6(L) 6.9(L)  Total Protein 6.5 - 8.1 g/dL 5.4(L) 5.7(L) 6.3(L)  Total Bilirubin 0.3 - 1.2 mg/dL 0.4 0.5 0.5  Alkaline Phos 38 - 126 U/L 137(H) 152(H) 173(H)  AST 15 - 41 U/L 102(H) 123(H) 137(H)  ALT 14 - 54 U/L 189(H) 228(H) 261(H)   Magnesium: 5.3  A/P: Pt is a 31 y.o. G1P0101 s/p Primary C-section for failure to progress, complicated by HELLP, POD#1 -HELLP  Labs as above, LFTs improving, Cr as above, platelets stable, will decrease frequency to daily  Pt asymptomatic, BP stable, adequate diuresis  Currently on IV Magnesium, if BP remains stable will  consider discontinuing Magnesium after 12hr from delivery - Pain well controlled -GU: Appropriate diuresis, UOP is adequate -GI: Tolerating general diet -Activity: encouraged sitting up to chair and ambulation as tolerated -Prophylaxis: SCDs in place -Baby boy in NICU, pt pumping  Janyth Pupa, DO [phone] (pager) [phone] (office)

## 2016-11-21 NOTE — Lactation Note (Signed)
This note was copied from a baby's chart. Lactation Consultation Note  Patient Name: Beverly Gonzalez [id number] Date: 11/21/2016 Reason for consult: Initial assessment;NICU baby  NICU baby 83 hours old. Mom pumping when this LC entered the room, and mom able to collect almost 5 ml of colostrum. Enc mom to pump every 2-3 hours for a total of 8 times/24 hours followed by hand expression. Assisted mom with hand expression with no additional colostrum flowing. Reviewed EBM storage guidelines and enc mom to continue taking to NICU. Enc mom to take pumping kit home at D/C and mom aware of pumping rooms in the NICU. Mom reports that she has a Spectra DEBP, and mom aware of 2-week DEBP rental program. Discussed the benefits of STS and nuzzling at the breast as she and baby able.   Maternal Data Has patient been taught Hand Expression?: Yes Does the patient have breastfeeding experience prior to this delivery?: No  Feeding    LATCH Score/Interventions                      Lactation Tools Discussed/Used WIC Program: No Pump Review: Setup, frequency, and cleaning;Milk Storage Initiated by:: Bedside RN Date initiated:: 11/20/16   Consult Status Consult Status: Follow-up Date: 11/22/16 Follow-up type: In-patient    [name] 11/21/2016, 2:22 PM

## 2016-11-22 ENCOUNTER — Encounter (HOSPITAL_COMMUNITY): Payer: Self-pay | Admitting: *Deleted

## 2016-11-22 LAB — TYPE AND SCREEN
ABO/RH(D): O POS
Antibody Screen: NEGATIVE
UNIT DIVISION: 0
Unit division: 0

## 2016-11-22 LAB — CBC WITH DIFFERENTIAL/PLATELET
Basophils Absolute: 0 10*3/uL (ref 0.0–0.1)
Basophils Relative: 0 %
EOS ABS: 0 10*3/uL (ref 0.0–0.7)
EOS PCT: 0 %
HCT: 26.6 % — ABNORMAL LOW (ref 36.0–46.0)
Hemoglobin: 9.7 g/dL — ABNORMAL LOW (ref 12.0–15.0)
LYMPHS ABS: 3 10*3/uL (ref 0.7–4.0)
Lymphocytes Relative: 36 %
MCH: 34.2 pg — ABNORMAL HIGH (ref 26.0–34.0)
MCHC: 36.5 g/dL — ABNORMAL HIGH (ref 30.0–36.0)
MCV: 93.7 fL (ref 78.0–100.0)
Monocytes Absolute: 0.4 10*3/uL (ref 0.1–1.0)
Monocytes Relative: 5 %
Neutro Abs: 4.9 10*3/uL (ref 1.7–7.7)
Neutrophils Relative %: 59 %
PLATELETS: 134 10*3/uL — AB (ref 150–400)
RBC: 2.84 MIL/uL — AB (ref 3.87–5.11)
RDW: 12.9 % (ref 11.5–15.5)
WBC: 8.4 10*3/uL (ref 4.0–10.5)

## 2016-11-22 LAB — CULTURE, BETA STREP (GROUP B ONLY)

## 2016-11-22 LAB — COMPREHENSIVE METABOLIC PANEL
ALT: 131 U/L — AB (ref 14–54)
ANION GAP: 6 (ref 5–15)
AST: 68 U/L — ABNORMAL HIGH (ref 15–41)
Albumin: 2.4 g/dL — ABNORMAL LOW (ref 3.5–5.0)
Alkaline Phosphatase: 140 U/L — ABNORMAL HIGH (ref 38–126)
BUN: 15 mg/dL (ref 6–20)
CHLORIDE: 107 mmol/L (ref 101–111)
CO2: 24 mmol/L (ref 22–32)
CREATININE: 0.9 mg/dL (ref 0.44–1.00)
Calcium: 7.3 mg/dL — ABNORMAL LOW (ref 8.9–10.3)
GFR calc Af Amer: >60 mL/min (ref >60)
GFR calc non Af Amer: >60 mL/min (ref >60)
Glucose, Bld: 86 mg/dL (ref 65–99)
POTASSIUM: 4.3 mmol/L (ref 3.5–5.1)
SODIUM: 137 mmol/L (ref 135–145)
Total Bilirubin: 0.5 mg/dL (ref 0.3–1.2)
Total Protein: 5.1 g/dL — ABNORMAL LOW (ref 6.5–8.1)

## 2016-11-22 LAB — BPAM RBC
BLOOD PRODUCT EXPIRATION DATE: 201806292359
BLOOD PRODUCT EXPIRATION DATE: 201806292359
UNIT TYPE AND RH: 5100
UNIT TYPE AND RH: 5100

## 2016-11-22 NOTE — Progress Notes (Signed)
Postpartum Note Day # 2  S:  Patient feeling much better today.  She reports some soreness with activity, but feels like the medication is working well.  Tolerating regular diet. + flatus, no BM.  Lochia minimal. Pt ambulating and voiding freely. She denies n/v/f/c, SOB, or CP.  Pt plans on breastfeeding.  O: Temp:  [97.5 F (36.4 C)-99.4 F (37.4 C)] 98.9 F (37.2 C) (06/08 0408) Pulse Rate:  [53-62] 62 (06/08 0408) Resp:  [18] 18 (06/08 0408) BP: (121-143)/(76-81) 121/76 (06/08 0408) SpO2:  [97 %-100 %] 97 % (06/08 0408) Weight:  [163 lb (73.9 kg)-164 lb 12 oz (74.7 kg)] 164 lb 12 oz (74.7 kg) (06/08 0535)   Gen: A&Ox3, NAD CV: RRR Resp: CTAB Abdomen: soft, NT, ND +BS Uterus: firm, non-tender, below umbilicus Incision: c/d/i, bandage on Ext: No edema, no calf tenderness bilaterally, SCDs in place  Labs:  CBC Latest Ref Rng & Units 11/22/2016 11/21/2016 11/20/2016  WBC 4.0 - 10.5 K/uL 8.4 10.7(H) 11.6(H)  Hemoglobin 12.0 - 15.0 g/dL 9.7(L) 11.3(L) 11.8(L)  Hematocrit 36.0 - 46.0 % 26.6(L) 31.2(L) 32.4(L)  Platelets 150 - 400 K/uL 134(L) 140(L) 134(L)   CMP Latest Ref Rng & Units 11/22/2016 11/21/2016 11/20/2016  Glucose 65 - 99 mg/dL 86 102(H) 98  BUN 6 - 20 mg/dL 15 19 21(H)  Creatinine 0.44 - 1.00 mg/dL 0.90 1.03(H) 1.01(H)  Sodium 135 - 145 mmol/L 137 135 132(L)  Potassium 3.5 - 5.1 mmol/L 4.3 4.4 3.7  Chloride 101 - 111 mmol/L 107 104 102  CO2 22 - 32 mmol/L 24 24 23   Calcium 8.9 - 10.3 mg/dL 7.3(L) 6.8(L) 6.6(L)  Total Protein 6.5 - 8.1 g/dL 5.1(L) 5.4(L) 5.7(L)  Total Bilirubin 0.3 - 1.2 mg/dL 0.5 0.4 0.5  Alkaline Phos 38 - 126 U/L 140(H) 137(H) 152(H)  AST 15 - 41 U/L 68(H) 102(H) 123(H)  ALT 14 - 54 U/L 131(H) 189(H) 228(H)   Magnesium: 5.3  A/P: Pt is a 31 y.o. G1P0101 s/p Primary C-section for failure to progress, complicated by HELLP, POD#2 -HELLP  Labs improving as above  Pt asymptomatic, BP stable, adequate diuresis  S/p Magneisum - Pain well controlled -GU:  Voiding freely,adequate diuresis -GI: Tolerating general diet -Activity: encouraged sitting up to chair and ambulation as tolerated -Prophylaxis: SCDs in place -Baby boy in NICU, pt pumping  DISPO: Continue postpartum care, plan for discharge home tomorrow with 1 week outpatient follow up.  Janyth Pupa, DO [phone] (pager) [phone] (office)

## 2016-11-22 NOTE — Lactation Note (Signed)
This note was copied from a baby's chart. Lactation Consultation Note  Patient Name: Unika Nazareno [facility] ASUOR'V Date: 11/22/2016 Reason for consult: Follow-up assessment   With this mom of a NICU baby, now 45 hours old, and 32 4/7 weeks CGA. Mom is pumping every 3 hours, and expressing up to 22 ml's I had her switch to the maintenance setting, 15-30 minutes, . Mom advised to add HE after pumping. I tried 21 flanges with mom, with coconut oil on her nipples. Mom reports they feel fine. I cautioned her that I think she will need 24 flanges at least, once her mil supply increases, perhaps by tomorrow. Basic  pumping teaching reviewed with mom, and she knows to call for lactation as needed, both while  the baby is in NICU, and once he is discharged. Mom is doing STS, and enjoying this very much.    Maternal Data    Feeding    LATCH Score/Interventions                      Lactation Tools Discussed/Used     Consult Status Consult Status: Follow-up Date: 11/23/16 Follow-up type: In-patient    Tonna Corner 11/22/2016, 11:57 AM

## 2016-11-23 LAB — COMPREHENSIVE METABOLIC PANEL
ALBUMIN: 2.4 g/dL — AB (ref 3.5–5.0)
ALK PHOS: 140 U/L — AB (ref 38–126)
ALT: 107 U/L — ABNORMAL HIGH (ref 14–54)
ANION GAP: 6 (ref 5–15)
AST: 55 U/L — ABNORMAL HIGH (ref 15–41)
BUN: 12 mg/dL (ref 6–20)
CALCIUM: 8.3 mg/dL — AB (ref 8.9–10.3)
CHLORIDE: 109 mmol/L (ref 101–111)
CO2: 23 mmol/L (ref 22–32)
Creatinine, Ser: 0.63 mg/dL (ref 0.44–1.00)
GFR calc non Af Amer: >60 mL/min (ref >60)
GLUCOSE: 83 mg/dL (ref 65–99)
POTASSIUM: 4.2 mmol/L (ref 3.5–5.1)
SODIUM: 138 mmol/L (ref 135–145)
Total Bilirubin: 0.7 mg/dL (ref 0.3–1.2)
Total Protein: 5.4 g/dL — ABNORMAL LOW (ref 6.5–8.1)

## 2016-11-23 LAB — CBC WITH DIFFERENTIAL/PLATELET
BASOS PCT: 0 %
Basophils Absolute: 0 10*3/uL (ref 0.0–0.1)
EOS ABS: 0.1 10*3/uL (ref 0.0–0.7)
EOS PCT: 2 %
HCT: 25.8 % — ABNORMAL LOW (ref 36.0–46.0)
HEMOGLOBIN: 9.4 g/dL — AB (ref 12.0–15.0)
LYMPHS ABS: 2.4 10*3/uL (ref 0.7–4.0)
Lymphocytes Relative: 30 %
MCH: 34.1 pg — AB (ref 26.0–34.0)
MCHC: 36.4 g/dL — AB (ref 30.0–36.0)
MCV: 93.5 fL (ref 78.0–100.0)
MONO ABS: 0.3 10*3/uL (ref 0.1–1.0)
MONOS PCT: 4 %
NEUTROS PCT: 64 %
Neutro Abs: 5 10*3/uL (ref 1.7–7.7)
Platelets: 148 10*3/uL — ABNORMAL LOW (ref 150–400)
RBC: 2.76 MIL/uL — ABNORMAL LOW (ref 3.87–5.11)
RDW: 12.9 % (ref 11.5–15.5)
WBC: 7.8 10*3/uL (ref 4.0–10.5)

## 2016-11-23 MED ORDER — LABETALOL HCL 100 MG PO TABS: 100.0000 mg | ORAL_TABLET | Freq: Two times a day (BID) | ORAL | 1 refills | Status: DC

## 2016-11-23 MED ORDER — SENNOSIDES-DOCUSATE SODIUM 8.6-50 MG PO TABS: 2.0000 | ORAL_TABLET | ORAL | 2 refills | Status: DC

## 2016-11-23 MED ORDER — MEASLES, MUMPS & RUBELLA VAC ~~LOC~~ INJ
0.5000 mL | INJECTION | Freq: Once | SUBCUTANEOUS | Status: AC
  Administered 2016-11-23: 0.5 mL via SUBCUTANEOUS
  Filled 2016-11-23 (×2): qty 0.5

## 2016-11-23 MED ORDER — OXYCODONE-ACETAMINOPHEN 5-325 MG PO TABS: 1.0000 | ORAL_TABLET | Freq: Four times a day (QID) | ORAL | 0 refills | Status: DC | PRN

## 2016-11-23 NOTE — Progress Notes (Signed)

## 2016-11-23 NOTE — Lactation Note (Signed)
This note was copied from a baby's chart. Lactation Consultation Note  Patient Name: Beverly Gonzalez [facility] [id number] Date: 11/23/2016 Reason for consult: Follow-up assessment;NICU baby;Infant < 6lbs (32.2 GA)   Follow-up consult with mom of NICU baby at 51 hrs old.  GA 32.2; BW 2 lbs, 14.9 oz.  Mom is a P1.  Mom is a [employer].   Infant in NICU and has been receiving mom's EBM via gavage. Mom reports pumping every 2-3 hrs and pumped 45 ml with last pumping.  Reports using #24 flanges for more comfortable fit.   FOB got mom a Spectra pump this morning for her to use after discharge to home.   Mom has all Medela DEBP kit pieces packed up for discharge.  Encouraged mom to save Medela pieces for use here in hospital in NICU rooms when she visits baby. Mom's breast are beginning to fill lactogenesis III phase.  Nodular areas palpated, but softened with hand expression; transitional milk easily flowing with hand expression.   Discussed how to do hands-on pumping to maximize milk output and empty nodular areas on breast.  Reviewed engorgement prevention and encouraged to use ice as needed for comfort to decrease swelling. Encouraged STS as soon as infant is able.   Encouraged her to call for questions after discharge; informed that [facility] still available for assist in NICU as needed and to call for latching assistance.   Mom is very pleasant and appreciative of information given.     Maternal Data Has patient been taught Hand Expression?: Yes  Feeding Feeding Type: Breast Milk Length of feed: 30 min  LATCH Score/Interventions       Type of Nipple: Everted at rest and after stimulation     Problem noted: Filling Interventions (Filling): Massage        Lactation Tools Discussed/Used     Consult Status Consult Status: PRN Follow-up type: Other (comment) (Infant will be in NICU after mom is discharged)    Merlene Laughter 11/23/2016, 2:47 PM

## 2016-11-23 NOTE — Discharge Instructions (Signed)
Cesarean Delivery, Care After °Refer to this sheet in the next few weeks. These instructions provide you with information about caring for yourself after your procedure. Your health care provider may also give you more specific instructions. Your treatment has been planned according to current medical practices, but problems sometimes occur. Call your health care provider if you have any problems or questions after your procedure. °What can I expect after the procedure? °After the procedure, it is common to have: °· A small amount of blood or clear fluid coming from the incision. °· Some redness, swelling, and pain in your incision area. °· Some abdominal pain and soreness. °· Vaginal bleeding (lochia). °· Pelvic cramps. °· Fatigue. ° °Follow these instructions at home: °Incision care ° °· Follow instructions from your health care provider about how to take care of your incision. Make sure you: °? Wash your hands with soap and water before you change your bandage (dressing). If soap and water are not available, use hand sanitizer. °? Change your dressing as told by your health care provider. °? Leave stitches (sutures), skin staples, skin glue, or adhesive strips in place. These skin closures may need to stay in place for 2 weeks or longer. If adhesive strip edges start to loosen and curl up, you may trim the loose edges. Do not remove adhesive strips completely unless your health care provider tells you to do that. °· Check your incision area every day for signs of infection. Check for: °? More redness, swelling, or pain. °? More fluid or blood. °? Warmth. °? Pus or a bad smell. °· When you cough or sneeze, hug a pillow. This helps with pain and decreases the chance of your incision opening up (dehiscing). Do this until your incision heals. °Medicines °· Take over-the-counter and prescription medicines only as told by your health care provider. °· If you were prescribed an antibiotic medicine, take it as told by  your health care provider. Do not stop taking the antibiotic until it is finished. °Driving °· Do not drive or operate heavy machinery while taking prescription pain medicine. °· Do not drive for 24 hours if you received a sedative. °Lifestyle °· Do not drink alcohol. This is especially important if you are breastfeeding or taking pain medicine. °· Do not use tobacco products, including cigarettes, chewing tobacco, or e-cigarettes. If you need help quitting, ask your health care provider. Tobacco can delay wound healing. °Eating and drinking °· Drink at least 8 eight-ounce glasses of water every day unless told not to by your health care provider. If you breastfeed, you may need to drink more water than this. °· Eat high-fiber foods every day. These foods may help prevent or relieve constipation. High-fiber foods include: °? Whole grain cereals and breads. °? Brown rice. °? Beans. °? Fresh fruits and vegetables. °Activity °· Return to your normal activities as told by your health care provider. Ask your health care provider what activities are safe for you. °· Rest as much as possible. Try to rest or take a nap while your baby is sleeping. °· Do not lift anything that is heavier than your baby or 10 lb (4.5 kg) as told by your health care provider. °· Ask your health care provider when you can engage in sexual activity. This may depend on your: °? Risk of infection. °? Healing rate. °? Comfort and desire to engage in sexual activity. °Bathing °· Do not take baths, swim, or use a hot tub until your health care   provider approves. Ask your health care provider if you can take showers. You may only be allowed to take sponge baths until your incision heals.  Keep your dressing dry as told by your health care provider. General instructions  Do not use tampons or douches until your health care provider approves.  Wear: ? Loose, comfortable clothing. ? A supportive and well-fitting bra.  Watch for any blood clots  that may pass from your vagina. These may look like clumps of dark red, brown, or black discharge.  Keep your perineum clean and dry as told by your health care provider.  Wipe from front to back when you use the toilet.  If possible, have someone help you care for your baby and help with household activities for a few days after you leave the hospital.  Keep all follow-up visits for you and your baby as told by your health care provider. This is important. Contact a health care provider if:  You have: ? Bad-smelling vaginal discharge. ? Difficulty urinating. ? Pain when urinating. ? A sudden increase or decrease in the frequency of your bowel movements. ? More redness, swelling, or pain around your incision. ? More fluid or blood coming from your incision. ? Pus or a bad smell coming from your incision. ? A fever. ? A rash. ? Little or no interest in activities you used to enjoy. ? Questions about caring for yourself or your baby. ? Nausea.  Your incision feels warm to the touch.  Your breasts turn red or become painful or hard.  You feel unusually sad or worried.  You vomit.  You pass large blood clots from your vagina. If you pass a blood clot, save it to show to your health care provider. Do not flush blood clots down the toilet without showing your health care provider.  You urinate more than usual.  You are dizzy or light-headed.  You have not breastfed and have not had a menstrual period for 12 weeks after delivery.  You stopped breastfeeding and have not had a menstrual period for 12 weeks after stopping breastfeeding. Get help right away if:  You have: ? Pain that does not go away or get better with medicine. ? Chest pain. ? Difficulty breathing. ? Blurred vision or spots in your vision. ? Thoughts about hurting yourself or your baby. ? New pain in your abdomen or in one of your legs. ? A severe headache.  You faint.  You bleed from your vagina so much  that you fill two sanitary pads in one hour. This information is not intended to replace advice given to you by your health care provider. Make sure you discuss any questions you have with your health care provider. Document Released: 02/23/2002 Document Revised: 10/12/2015 Document Reviewed: 05/08/2015 Elsevier Interactive Patient Education  2017 [employer]   HELLP Syndrome HELLP syndrome is a life-threatening liver disorder thought to be a type of severe preeclampsia during pregnancy. Preeclampsia is a disorder of pregnancy that causes high blood pressure and protein in the urine. It develops after the 20th week of pregnancy. The name HELLP stands for:  H - hemolytic anemia, hemolysis (destruction of blood cells).  EL - elevated liver enzymes (sign of liver damage).  LP - low platelet count (blood cells that help stop bleeding).  HELLP syndrome often occurs without warning and can be difficult to recognize. In most cases, HELLP syndrome occurs before 35 weeks of pregnancy, but it can also develop right after childbirth. It  can be fatal to both the mother and baby. What are the causes? The cause of this condition is not known at this time. What are the signs or symptoms? Symptoms of this condition include:  Headache.  Blurry vision.  Pain in the upper right abdomen.  Shoulder, neck, and upper body pain.  Fatigue.  Feeling sick.  Seizures.  Extreme weight gain or swelling.  How is this diagnosed? This condition is diagnosed based on results of a blood test, which include:  A complete blood count.  Liver enzymes test (liver function tests).  Kidney function test.  Measurement of salts and other chemicals in your blood (electrolytes).  Blood coagulation tests.  How is this treated?  This condition is treated by delivering your baby as soon as possible. This may be done by giving you medicines to start contractions (induction of labor) or cesarean  delivery.  Before delivery, you can be treated for a short time with an injection of magnesium sulphate. This medicine reduces muscle contractions and blocks the impulse from nerves to the muscles, which helps prevent seizures.  Medicines to lower and control blood pressure may also be used. Steroid hormones (corticosteroids) may be given to help your baby's lungs mature faster.  Your health care provider may recommend that you take one low-dose aspirin (81 mg) each day to help prevent high blood pressure during your pregnancy if you are at risk for preeclampsia. You may be at risk for preeclampsia if: ? You had preeclampsia or eclampsia (high blood pressure with seizures) during a previous pregnancy. ? Your baby did not grow as expected during a previous pregnancy. ? You experienced preterm birth with a previous pregnancy. ? You experienced a separation of the placenta from the uterus (placental abruption) during a previous pregnancy. ? You experienced the loss of your baby during a previous pregnancy. ? You are pregnant with more than one baby. ? You have other medical conditions (such as diabetes or autoimmune disease).  Continuous medical management and monitoring of you and your baby is needed. This is true during pregnancy, during labor, during delivery, and after delivery (postpartum). A blood transfusion may be required if bleeding problems become severe. Get help right away if: You have symptoms of HELLP syndrome during your pregnancy.  You may need to call your local emergency services (911 in [place] to get to the hospital as soon as possible.  Do not drive yourself to the hospital.  This information is not intended to replace advice given to you by your health care provider. Make sure you discuss any questions you have with your health care provider. Document Released: 09/09/2006 Document Revised: 11/09/2015 Document Reviewed: 11/19/2012 Elsevier Interactive Patient Education   2017 [employer].

## 2016-11-23 NOTE — Discharge Summary (Signed)
OB Discharge Summary     Patient Name: Beverly Gonzalez DOB: Mar 27, 1986 MRN: [id number]  Date of admission: 11/18/2016 Delivering MD: Janyth Pupa   Date of discharge: 11/23/2016  Admitting diagnosis: 31WKS ELEVATED LFT'S AND HIGH BP Intrauterine pregnancy: [redacted]w[redacted]d     Secondary diagnosis:  Active Problems:   Severe preeclampsia, third trimester  Additional problems: HELLP     Discharge diagnosis: Preterm Pregnancy Delivered and HELLP                                                                                                Post partum procedures:None  Augmentation: Foley, pitocin, AROM  Complications: None  Hospital course:  Induction of Labor With Cesarean Section  31 y.o. yo G1P0101 at [redacted]w[redacted]d was admitted to the hospital 11/18/2016 for induction of labor. Patient had a labor course significant for HELLP. The patient went for cesarean section due to Arrest of Dilation, and delivered a Viable infant,@BABYSUPPRESS (DBLINK,ept,110,,1,,) Membrane Rupture Time/Date: )7:14 PM ,11/20/2016   @Details  of operation can be found in separate operative Note.  Patient had an uncomplicated postpartum course. She is ambulating, tolerating a regular diet, passing flatus, and urinating well.  Patient is discharged home in stable condition on 11/23/16.                                    Physical exam  Vitals:   11/22/16 2205 11/22/16 2321 11/23/16 0347 11/23/16 0810  BP: (!) 146/79 132/73 (!) 142/77 (!) 152/87  Pulse: (!) 56 62 (!) 50 (!) 55  Resp: 18 18 16 16   Temp:  98.4 F (36.9 C) 98.4 F (36.9 C) 98.8 F (37.1 C)  TempSrc:  Oral Oral Oral  SpO2:  99% 98% 100%  Weight:      Height:       General: alert, cooperative and no distress Lochia: appropriate Uterine Fundus: firm Incision: Dressing is clean, dry, and intact DVT Evaluation: No evidence of DVT seen on physical exam. Negative Homan's sign. Labs: Lab Results  Component Value Date   WBC 7.8 11/23/2016   HGB 9.4 (L)  11/23/2016   HCT 25.8 (L) 11/23/2016   MCV 93.5 11/23/2016   PLT 148 (L) 11/23/2016   CMP Latest Ref Rng & Units 11/23/2016  Glucose 65 - 99 mg/dL 83  BUN 6 - 20 mg/dL 12  Creatinine 0.44 - 1.00 mg/dL 0.63  Sodium 135 - 145 mmol/L 138  Potassium 3.5 - 5.1 mmol/L 4.2  Chloride 101 - 111 mmol/L 109  CO2 22 - 32 mmol/L 23  Calcium 8.9 - 10.3 mg/dL 8.3(L)  Total Protein 6.5 - 8.1 g/dL 5.4(L)  Total Bilirubin 0.3 - 1.2 mg/dL 0.7  Alkaline Phos 38 - 126 U/L 140(H)  AST 15 - 41 U/L 55(H)  ALT 14 - 54 U/L 107(H)    Discharge instruction: per After Visit Summary and "Baby and Me Booklet".  After visit meds:  Allergies as of 11/23/2016   No Known Allergies     Medication List    STOP  taking these medications   DICLEGIS 10-10 MG Tbec Generic drug:  Doxylamine-Pyridoxine   ondansetron 4 MG disintegrating tablet Commonly known as:  ZOFRAN-ODT     TAKE these medications   calcium carbonate 500 MG chewable tablet Commonly known as:  TUMS - dosed in mg elemental calcium Chew 1 tablet by mouth 3 (three) times daily as needed for indigestion or heartburn.   labetalol 100 MG tablet Commonly known as:  NORMODYNE Take 1 tablet (100 mg total) by mouth 2 (two) times daily.   oxyCODONE-acetaminophen 5-325 MG tablet Commonly known as:  PERCOCET/ROXICET Take 1-2 tablets by mouth every 6 (six) hours as needed for moderate pain.   ranitidine 75 MG tablet Commonly known as:  ZANTAC Take 75 mg by mouth 2 (two) times daily.   senna-docusate 8.6-50 MG tablet Commonly known as:  Senokot-S Take 2 tablets by mouth daily. Start taking on:  11/24/2016       Diet: routine diet  Activity: Advance as tolerated. Pelvic rest for 6 weeks.   Outpatient follow up:1 week Follow up Appt:No future appointments. Follow up Visit:No Follow-up on file.  Postpartum contraception: Undecided  Newborn Data: Live born female  Birth Weight: 2 lb 14.9 oz (1330 g) APGAR: 3, 5  Baby Feeding:  Breast Disposition:NICU   11/23/2016 Starla Link, CNM

## 2016-12-09 ENCOUNTER — Ambulatory Visit: Payer: Self-pay

## 2016-12-09 NOTE — Lactation Note (Signed)
This note was copied from a baby's chart. Lactation Consultation Note  Patient Name: Beverly Gonzalez [id number] Date: 12/09/2016  Mom is pumping 8-12 times in 24 hours and obtaining 3-4 ounces from each breast.  Praised for her efforts.  No concerns or questions at this time.   Maternal Data    Feeding Feeding Type: Breast Milk Length of feed: 60 min  LATCH Score/Interventions                      Lactation Tools Discussed/Used     Consult Status      Ave Filter 12/09/2016, 10:10 AM

## 2016-12-21 ENCOUNTER — Ambulatory Visit: Payer: Self-pay

## 2016-12-21 NOTE — Lactation Note (Signed)
This note was copied from a baby's chart. Lactation Consultation Note  Patient Name: Beverly Gonzalez [facility]KMMN'O Date: 12/21/2016 Reason for consult: Follow-up assessment;Breast/nipple pain (NICU RN/ Debbie called to see mom due to her left lateral breast pain )  LC visited mom in NICU, mom holding her baby when [place] walked in.  Per mom the pain started in the left side her breast when woke up this am. Wasn't sure if she slept wrong and her arm was pressing against that side of her breast. Prior to coming to visit baby in NICU took a Motrin with some results to decrease breast ache. Last night around 11pm pumped off 10 -12 oz, and then was so tired slept 4 1/2 - 5 hours, woke up and her breast were engorged. And when she pumped both breast together , the amount was less from the left compared to the right.  Mom denies elevated temperature, chills , or flu like S/S's, or the lateral aspect of the left breast being pink or red, just tender, no nodules.  [place] asked mom permission 1st prior to assessment, LC noted the breast to be full, no pink areas or plug noted, just mildly tender when pressing inward.  [place] suspects due to the volume mom consistently pumps off , and going 4-1/2 - 5 hours during the night , and waking up engorged may have caused the soreness. LC recommended heat prior to pumping ( moist ) and then to pump.  Tonight not to go over 3 hours without pumping and see if that helps.  If not better by tomorrow to have NICU RN call LC to see her while visiting baby in NICU.     Maternal Data    Feeding    LATCH Score/Interventions                      Lactation Tools Discussed/Used     Consult Status Consult Status: PRN Follow-up type: In-patient    [place] 12/21/2016, 1:22 PM

## 2016-12-23 ENCOUNTER — Ambulatory Visit (INDEPENDENT_AMBULATORY_CARE_PROVIDER_SITE_OTHER): Payer: 59 | Admitting: Family Medicine

## 2016-12-23 ENCOUNTER — Encounter: Payer: Self-pay | Admitting: Family Medicine

## 2016-12-23 VITALS — BP 116/72 | HR 88 | Ht 69.0 in | Wt 145.4 lb

## 2016-12-23 DIAGNOSIS — D696 Thrombocytopenia, unspecified: Secondary | ICD-10-CM | POA: Diagnosis not present

## 2016-12-23 DIAGNOSIS — O99119 Other diseases of the blood and blood-forming organs and certain disorders involving the immune mechanism complicating pregnancy, unspecified trimester: Secondary | ICD-10-CM

## 2016-12-23 DIAGNOSIS — Z98891 History of uterine scar from previous surgery: Secondary | ICD-10-CM

## 2016-12-23 DIAGNOSIS — R945 Abnormal results of liver function studies: Secondary | ICD-10-CM

## 2016-12-23 DIAGNOSIS — R7989 Other specified abnormal findings of blood chemistry: Secondary | ICD-10-CM

## 2016-12-23 DIAGNOSIS — Z8759 Personal history of other complications of pregnancy, childbirth and the puerperium: Secondary | ICD-10-CM

## 2016-12-23 DIAGNOSIS — Z7689 Persons encountering health services in other specified circumstances: Secondary | ICD-10-CM

## 2016-12-23 NOTE — Progress Notes (Signed)
Subjective:    Patient ID: Beverly Gonzalez, female    DOB: 11/05/1985, 31 y.o.   MRN: [id number]  HPI Chief Complaint  Patient presents with  . Establish Care    visit just to establish care, does need some labs drawn and has form with her- can come back if needed for lab visit.    She is a pleasant 31 year old caucasian female who is new to the practice and here to establish care.  Previous medical care: OB/GYN Dr. Nelda Marseille at [place].   States she just delivered a child on June 6th at [redacted] weeks gestation. She had HELLP syndrome and was induced. This was her first pregnancy. She had a C-section. Her son is the NICU at [facility] hospital. She is breast feeding and is pumping. Reports abundant mild production. States her child is continuing to improve.   No history of HTN prior to pregnancy.  No significant PMH or SH prior to pregnancy complications.   She was taking Labetalol and stopped this June 19th.   Hepatic function was abnormal, platelets were low but improving per records. Kidney function has improved per records. Will need repeat labs today.   States she feels fine now. She is due to follow up for her 6 weeks post partum next week.   States she feels some guilt and sadness about her pregnancy but understands that there was nothing she could have done to prevent the occurrences. States she is doing ok emotionally and has a good support network.  She has a husband. Works as a Engineer, drilling. Is generally a very active and healthy individual.   Reviewed allergies, medications, past medical, surgical, family, and social history.   Review of Systems Pertinent positives and negatives in the history of present illness.     Objective:   Physical Exam  Constitutional: She is oriented to person, place, and time. She appears well-developed and well-nourished. No distress.  HENT:  Mouth/Throat: Oropharynx is clear and moist.  Eyes: Conjunctivae are normal.  Neck: Neck supple. No  thyromegaly present.  Cardiovascular: Normal rate, regular rhythm and normal heart sounds.   Pulmonary/Chest: Effort normal and breath sounds normal.  Abdominal: Soft. Bowel sounds are normal. There is no hepatosplenomegaly. There is no tenderness. There is no rigidity and no rebound.    Well-healed incision to lower abdomen.   Lymphadenopathy:    She has no cervical adenopathy.       Right: No supraclavicular adenopathy present.       Left: No supraclavicular adenopathy present.  Neurological: She is alert and oriented to person, place, and time. Coordination and gait normal.  Skin: Skin is warm and dry. No rash noted. No pallor.  Psychiatric: She has a normal mood and affect. Her speech is normal and behavior is normal. Thought content normal.   BP 116/72 (BP Location: Left Arm, Patient Position: Sitting, Cuff Size: Normal)   Pulse 88   Ht 5\' 9"  (1.753 m)   Wt 145 lb 6.4 oz (66 kg)   Breastfeeding? Yes   BMI 21.47 kg/m       Assessment & Plan:  History of severe pre-eclampsia - Plan: CBC with Differential/Platelet, COMPLETE METABOLIC PANEL WITH GFR, TSH  Encounter to establish care  Elevated LFTs - Plan: COMPLETE METABOLIC PANEL WITH GFR  History of C-section  Thrombocytopenia during pregnancy ([place]) - Plan: CBC with Differential/Platelet  She appears to be doing well overall and is getting closer to her usual state of health.  Her incision appears to be healing well and no sign of infection.  Discussed that she has experienced a great deal emotionally and physically but she reportedly has a good support network and feels like she is doing ok. She will let me know if she experiences worsening emotional disturbances.  Plan to recheck labs and follow up.

## 2016-12-24 LAB — CBC WITH DIFFERENTIAL/PLATELET
BASOS ABS: 0 {cells}/uL (ref 0–200)
BASOS PCT: 0 %
EOS ABS: 150 {cells}/uL (ref 15–500)
Eosinophils Relative: 3 %
HCT: 37.8 % (ref 35.0–45.0)
Hemoglobin: 12.6 g/dL (ref 11.7–15.5)
LYMPHS PCT: 36 %
Lymphs Abs: 1800 cells/uL (ref 850–3900)
MCH: 32.3 pg (ref 27.0–33.0)
MCHC: 33.3 g/dL (ref 32.0–36.0)
MCV: 96.9 fL (ref 80.0–100.0)
MPV: 9.4 fL (ref 7.5–12.5)
Monocytes Absolute: 450 cells/uL (ref 200–950)
Monocytes Relative: 9 %
Neutro Abs: 2600 cells/uL (ref 1500–7800)
Neutrophils Relative %: 52 %
Platelets: 213 10*3/uL (ref 140–400)
RBC: 3.9 MIL/uL (ref 3.80–5.10)
RDW: 12.6 % (ref 11.0–15.0)
WBC: 5 10*3/uL (ref 4.0–10.5)

## 2016-12-24 LAB — COMPLETE METABOLIC PANEL WITH GFR
ALT: 25 U/L (ref 6–29)
AST: 29 U/L (ref 10–30)
Albumin: 4.3 g/dL (ref 3.6–5.1)
Alkaline Phosphatase: 133 U/L — ABNORMAL HIGH (ref 33–115)
BILIRUBIN TOTAL: 0.7 mg/dL (ref 0.2–1.2)
BUN: 15 mg/dL (ref 7–25)
CALCIUM: 9.5 mg/dL (ref 8.6–10.2)
CHLORIDE: 104 mmol/L (ref 98–110)
CO2: 23 mmol/L (ref 20–31)
CREATININE: 0.81 mg/dL (ref 0.50–1.10)
Glucose, Bld: 91 mg/dL (ref 65–99)
Potassium: 4.1 mmol/L (ref 3.5–5.3)
Sodium: 139 mmol/L (ref 135–146)
TOTAL PROTEIN: 7 g/dL (ref 6.1–8.1)

## 2016-12-24 LAB — TSH: TSH: 0.65 mIU/L

## 2016-12-26 DIAGNOSIS — S0502XA Injury of conjunctiva and corneal abrasion without foreign body, left eye, initial encounter: Secondary | ICD-10-CM | POA: Diagnosis not present

## 2017-03-05 DIAGNOSIS — L709 Acne, unspecified: Secondary | ICD-10-CM | POA: Diagnosis not present

## 2017-03-05 DIAGNOSIS — D229 Melanocytic nevi, unspecified: Secondary | ICD-10-CM | POA: Diagnosis not present

## 2017-03-28 DIAGNOSIS — Z30011 Encounter for initial prescription of contraceptive pills: Secondary | ICD-10-CM | POA: Diagnosis not present

## 2017-03-28 DIAGNOSIS — Z124 Encounter for screening for malignant neoplasm of cervix: Secondary | ICD-10-CM | POA: Diagnosis not present

## 2017-03-28 DIAGNOSIS — Z8619 Personal history of other infectious and parasitic diseases: Secondary | ICD-10-CM | POA: Diagnosis not present

## 2017-04-14 ENCOUNTER — Encounter: Payer: Self-pay | Admitting: Family Medicine

## 2017-04-22 ENCOUNTER — Ambulatory Visit (HOSPITAL_COMMUNITY): Payer: 59

## 2017-04-24 ENCOUNTER — Ambulatory Visit (HOSPITAL_COMMUNITY)
Admission: RE | Admit: 2017-04-24 | Discharge: 2017-04-24 | Disposition: A | Discharge status: Home / Self Care | Payer: 59 | Source: Ambulatory Visit | Attending: Obstetrics & Gynecology | Admitting: Obstetrics & Gynecology

## 2017-04-24 ENCOUNTER — Encounter (HOSPITAL_COMMUNITY): Payer: Self-pay

## 2017-04-24 DIAGNOSIS — Z8759 Personal history of other complications of pregnancy, childbirth and the puerperium: Secondary | ICD-10-CM

## 2017-04-24 DIAGNOSIS — Z8249 Family history of ischemic heart disease and other diseases of the circulatory system: Secondary | ICD-10-CM | POA: Insufficient documentation

## 2017-04-24 DIAGNOSIS — Z87898 Personal history of other specified conditions: Secondary | ICD-10-CM | POA: Insufficient documentation

## 2017-04-24 DIAGNOSIS — Z8742 Personal history of other diseases of the female genital tract: Principal | ICD-10-CM

## 2017-04-24 DIAGNOSIS — Z3169 Encounter for other general counseling and advice on procreation: Secondary | ICD-10-CM | POA: Diagnosis not present

## 2017-04-24 DIAGNOSIS — Z833 Family history of diabetes mellitus: Secondary | ICD-10-CM | POA: Insufficient documentation

## 2017-04-24 DIAGNOSIS — Z8269 Family history of other diseases of the musculoskeletal system and connective tissue: Secondary | ICD-10-CM | POA: Diagnosis not present

## 2017-04-24 DIAGNOSIS — Z832 Family history of diseases of the blood and blood-forming organs and certain disorders involving the immune mechanism: Secondary | ICD-10-CM | POA: Diagnosis not present

## 2017-04-24 DIAGNOSIS — Z7189 Other specified counseling: Secondary | ICD-10-CM | POA: Insufficient documentation

## 2017-04-24 LAB — COMPREHENSIVE METABOLIC PANEL
ALBUMIN: 4.5 g/dL (ref 3.5–5.0)
ALT: 27 U/L (ref 14–54)
ANION GAP: 8 (ref 5–15)
AST: 33 U/L (ref 15–41)
Alkaline Phosphatase: 117 U/L (ref 38–126)
BUN: 22 mg/dL — ABNORMAL HIGH (ref 6–20)
CALCIUM: 9.8 mg/dL (ref 8.9–10.3)
CHLORIDE: 104 mmol/L (ref 101–111)
CO2: 28 mmol/L (ref 22–32)
Creatinine, Ser: 0.75 mg/dL (ref 0.44–1.00)
GFR calc non Af Amer: >60 mL/min (ref >60)
Glucose, Bld: 78 mg/dL (ref 65–99)
POTASSIUM: 4.1 mmol/L (ref 3.5–5.1)
SODIUM: 140 mmol/L (ref 135–145)
Total Bilirubin: 1.4 mg/dL — ABNORMAL HIGH (ref 0.3–1.2)
Total Protein: 7.2 g/dL (ref 6.5–8.1)

## 2017-04-24 LAB — CBC
HCT: 37.9 % (ref 36.0–46.0)
Hemoglobin: 13.2 g/dL (ref 12.0–15.0)
MCH: 32 pg (ref 26.0–34.0)
MCHC: 34.8 g/dL (ref 30.0–36.0)
MCV: 91.8 fL (ref 78.0–100.0)
PLATELETS: 191 10*3/uL (ref 150–400)
RBC: 4.13 MIL/uL (ref 3.87–5.11)
RDW: 14 % (ref 11.5–15.5)
WBC: 3.4 10*3/uL — ABNORMAL LOW (ref 4.0–10.5)

## 2017-04-24 NOTE — Consult Note (Signed)
Maternal Fetal Medicine Consultation  Requesting Provider(s): Ozan  Primary OB: Ozan Reason for consultation: History of early-onset severe preeclampsia with HELLP syndrome  HPI: [id number] here for preconceptual counseling. She is 5 months S/P C/S delivery of a growth restricted infant at 32+2 weeks, follow a 4 week period of intermittent epigastric pain and the development of HELLP syndrome. She began to suffer from severe intermittent epigastric pain about 28 weeks. She subsequently had labs that showed platelets 113k, AST 272 and ALT 396. At that time BPs were elevated in the 140-150's/90s with a baseline of 90s/60s in the first trimester. She had no real elevation of her urinary protein during the entire episode. She had no visual changes or headaches. She had full recovery after her delivery and by 1 month postdelivery platelets had recovered to 213k and LFTs were normal  OB History: OB History    Gravida Para Term Preterm AB Living   1 1   1   1    SAB TAB Ectopic Multiple Live Births         0 1      PMH:  Past Medical History:  Diagnosis Date  . Severe preeclampsia, third trimester 11/18/2016    PSH:  Past Surgical History:  Procedure Laterality Date  . TONSILLECTOMY    . WISDOM TOOTH EXTRACTION     Meds: PNV Allergies: NKDA FH: Father and PGF have type 2 DM, mother and father have CHTN. No history of congenital anomalies or developmental delay.  Soc: See EPIC section  Review of Systems: no vaginal bleeding or cramping/contractions, no LOF, no nausea/vomiting. All other systems reviewed and are negative.  PE:  VS: BP 130/68 GEN: well-appearing thin female in no apparent distress  A/P: History of early-onset severe atypical preeclampsia with HELLP I had a long discussion with Dr. Adair Patter about her pregnancy and her concerns about any subsequent pregnancies. These were clustered around 3 basic concerns: risk of recurrence in a subsequent pregnancy, optimal timing of a  subsequent pregnancy, and birth control until the decision is made to proceed with pregnancy. The patient is clearly at higher risk for recurrence, and that recurrence is at a higher risk for being both early and severe. The data are mixed concerning her numerical risk, and conflicting. Sibai's data from [place] in 1991 showed a recurrence risk of 65%. More recent data with greater numbers show a significantly better outcome. Data from the [place] in 2006 showed a recurrence rate of 25%, and Data from [place] from 2001 showed a 29% recurrence risk. A large meta-analysis from the [place] in 2015 looking at >75,000 women specifically looked a recurrence after HELLP and found a 7% recurrence of HELLP, 18% of preeclampsia, and 18% of gestational HTN. For the patient I have recommended starting 81mg  aspirin 1 month prior to discontinuing contraception and continuing it throughout the preconception period and the pregnancy. Baseline platelets, AST, ALT, and 24 hour urine protein should be obtained in the first trimester. Repeat MFM consultation in the early second trimester is advised, and period [place] for fetal growth is indicated as well. There is a family history of autoimmune disease but no family history of thromboembolic phenomena, so I will plan to check for antiphospholipid antibodies today, along with a CBC and CMP. The data concerning poor perinatal outcomes with short interpregnancy interval are difficult to interpret. There are significant risks to a short interpregnancy interval, but almost all data are gathered using 6 months as the IPI cutoff. Current ACOG  recommendations are for 18 month IPI. I have let the patient know that attempting pregnancy after a 12 month IPI is probably safe, especially since preeclampsia is associated with long IPI. I have encouraged her to contact you about contraception. She was very happy with her IUD but feels with her current plans it would not be cost effective. She  is leery of using oral contraceptives, and is interested in the [place]. I let her know that given a plan for pregnancy within a year, the progesterone-only pill is acceptable and has minimal liver effect. I have no experience with the [place] and have encouraged her to contact you for that.  Thank you for the opportunity to be a part of the care of [place]. Please contact our office if we can be of further assistance.   I spent approximately 40 minutes with this patient with over 50% of time spent in face-to-face counseling.

## 2017-04-24 NOTE — ED Notes (Signed)
Pt in for preconception counseling, BP130/68, pulse 62, wt 140lb.  Pt in with Dr. Lucia Gaskins.

## 2017-04-25 LAB — LUPUS ANTICOAGULANT PANEL
DRVVT: 35.4 s (ref 0.0–47.0)
PTT LA: 42 s (ref 0.0–51.9)

## 2017-04-26 LAB — BETA-2-GLYCOPROTEIN I ABS, IGG/M/A: Beta-2 Glyco I IgG: <9 GPI IgG units (ref 0–20)

## 2017-04-27 LAB — CARDIOLIPIN ANTIBODIES, IGG, IGM, IGA: Anticardiolipin IgM: 11 MPL U/mL (ref 0–12)

## 2017-04-28 ENCOUNTER — Telehealth (HOSPITAL_COMMUNITY): Payer: Self-pay | Admitting: *Deleted

## 2017-05-21 DIAGNOSIS — F411 Generalized anxiety disorder: Secondary | ICD-10-CM | POA: Diagnosis not present

## 2017-06-06 DIAGNOSIS — F411 Generalized anxiety disorder: Secondary | ICD-10-CM | POA: Diagnosis not present

## 2017-06-19 DIAGNOSIS — F411 Generalized anxiety disorder: Secondary | ICD-10-CM | POA: Diagnosis not present

## 2017-07-04 DIAGNOSIS — F411 Generalized anxiety disorder: Secondary | ICD-10-CM | POA: Diagnosis not present

## 2017-07-18 DIAGNOSIS — F411 Generalized anxiety disorder: Secondary | ICD-10-CM | POA: Diagnosis not present

## 2017-08-01 DIAGNOSIS — F411 Generalized anxiety disorder: Secondary | ICD-10-CM | POA: Diagnosis not present

## 2017-08-15 DIAGNOSIS — F411 Generalized anxiety disorder: Secondary | ICD-10-CM | POA: Diagnosis not present

## 2017-08-29 DIAGNOSIS — F411 Generalized anxiety disorder: Secondary | ICD-10-CM | POA: Diagnosis not present

## 2017-09-19 DIAGNOSIS — F411 Generalized anxiety disorder: Secondary | ICD-10-CM | POA: Diagnosis not present

## 2017-10-17 DIAGNOSIS — F411 Generalized anxiety disorder: Secondary | ICD-10-CM | POA: Diagnosis not present

## 2017-10-24 DIAGNOSIS — F411 Generalized anxiety disorder: Secondary | ICD-10-CM | POA: Diagnosis not present

## 2017-11-07 DIAGNOSIS — F411 Generalized anxiety disorder: Secondary | ICD-10-CM | POA: Diagnosis not present

## 2017-11-28 ENCOUNTER — Encounter: Payer: Self-pay | Admitting: Family Medicine

## 2017-11-28 ENCOUNTER — Ambulatory Visit (INDEPENDENT_AMBULATORY_CARE_PROVIDER_SITE_OTHER): Payer: 59 | Admitting: Family Medicine

## 2017-11-28 VITALS — BP 110/70 | HR 58 | Ht 69.0 in | Wt 139.6 lb

## 2017-11-28 DIAGNOSIS — Z0000 Encounter for general adult medical examination without abnormal findings: Secondary | ICD-10-CM

## 2017-11-28 DIAGNOSIS — F411 Generalized anxiety disorder: Secondary | ICD-10-CM | POA: Diagnosis not present

## 2017-11-28 NOTE — Patient Instructions (Addendum)
Call your insurance provider and find out about the HPV vaccine Gardasil. Let us know if you decide to get this.   You can call and schedule your Dentist appointment at any of the following offices:   [facility] Address: [address]  [place], [place] [address] Phone #: [phone]  [facility] Address: [address] [place], [place] [address] Phone # [phone]  [facility]  Address: [address]                                                                 [place], [place] [address] Phone #: [phone]   Preventative Care for Adults - Female      MAINTAIN REGULAR HEALTH EXAMS:  A routine yearly physical is a good way to check in with your primary care provider about your health and preventive screening. It is also an opportunity to share updates about your health and any concerns you have, and receive a thorough all-over exam.   Most health insurance companies pay for at least some preventative services.  Check with your health plan for specific coverages.  WHAT PREVENTATIVE SERVICES DO WOMEN NEED?  Adult women should have their weight and blood pressure checked regularly.   Women age 18 and older should have their cholesterol levels checked regularly.  Women should be screened for cervical cancer with a Pap smear and pelvic exam beginning at age 23.  Breast cancer screening generally begins at age 30 with a mammogram and breast exam by your primary care provider.    Beginning at age 29 and continuing to age 13, women should be screened for colorectal cancer.  Certain people may need continued testing until age 5.  Updating vaccinations is part of preventative care.  Vaccinations help protect against diseases such as the flu.  Osteoporosis is a disease in which the bones lose minerals and strength as we age. Women ages 68 and over should discuss this with their caregivers, as should  women after menopause who have other risk factors.  Lab tests are generally done as part of preventative care to screen for anemia and blood disorders, to screen for problems with the kidneys and liver, to screen for bladder problems, to check blood sugar, and to check your cholesterol level.  Preventative services generally include counseling about diet, exercise, avoiding tobacco, drugs, excessive alcohol consumption, and sexually transmitted infections.    GENERAL RECOMMENDATIONS FOR GOOD HEALTH:  Healthy diet:  Eat a variety of foods, including fruit, vegetables, animal or vegetable protein, such as meat, fish, chicken, and eggs, or beans, lentils, tofu, and grains, such as rice.  Drink plenty of water daily.  Decrease saturated fat in the diet, avoid lots of red meat, processed foods, sweets, fast foods, and fried foods.  Exercise:  Aerobic exercise helps maintain good heart health. At least 30-40 minutes of moderate-intensity exercise is recommended. For example, a brisk walk that increases your heart rate and breathing. This should be done on most days of the week.   Find a type of exercise or a variety of exercises that you enjoy so that it becomes a part of your daily life.  Examples are running, walking, swimming, water aerobics, and biking.  For motivation and support, explore group exercise such as aerobic class, spin class, Zumba, Yoga,or  martial arts, etc.    Set exercise goals for yourself, such as a certain weight goal, walk or run in a race such as a 5k walk/run.  Speak to your primary care provider about exercise goals.  Disease prevention:  If you smoke or chew tobacco, find out from your caregiver how to quit. It can literally save your life, no matter how long you have been a tobacco user. If you do not use tobacco, never begin.   Maintain a healthy diet and normal weight. Increased weight leads to problems with blood pressure and diabetes.   The Body Mass Index or  BMI is a way of measuring how much of your body is fat. Having a BMI above 27 increases the risk of heart disease, diabetes, hypertension, stroke and other problems related to obesity. Your caregiver can help determine your BMI and based on it develop an exercise and dietary program to help you achieve or maintain this important measurement at a healthful level.  High blood pressure causes heart and blood vessel problems.  Persistent high blood pressure should be treated with medicine if weight loss and exercise do not work.   Fat and cholesterol leaves deposits in your arteries that can block them. This causes heart disease and vessel disease elsewhere in your body.  If your cholesterol is found to be high, or if you have heart disease or certain other medical conditions, then you may need to have your cholesterol monitored frequently and be treated with medication.   Ask if you should have a cardiac stress test if your history suggests this. A stress test is a test done on a treadmill that looks for heart disease. This test can find disease prior to there being a problem.  Menopause can be associated with physical symptoms and risks. Hormone replacement therapy is available to decrease these. You should talk to your caregiver about whether starting or continuing to take hormones is right for you.   Osteoporosis is a disease in which the bones lose minerals and strength as we age. This can result in serious bone fractures. Risk of osteoporosis can be identified using a bone density scan. Women ages 16 and over should discuss this with their caregivers, as should women after menopause who have other risk factors. Ask your caregiver whether you should be taking a calcium supplement and Vitamin D, to reduce the rate of osteoporosis.   Avoid drinking alcohol in excess (more than two drinks per day).  Avoid use of street drugs. Do not share needles with anyone. Ask for professional help if you need  assistance or instructions on stopping the use of alcohol, cigarettes, and/or drugs.  Brush your teeth twice a day with fluoride toothpaste, and floss once a day. Good oral hygiene prevents tooth decay and gum disease. The problems can be painful, unattractive, and can cause other health problems. Visit your dentist for a routine oral and dental check up and preventive care every 6-12 months.   Look at your skin regularly.  Use a mirror to look at your back. Notify your caregivers of changes in moles, especially if there are changes in shapes, colors, a size larger than a pencil eraser, an irregular border, or development of new moles.  Safety:  Use seatbelts 100% of the time, whether driving or as a passenger.  Use safety devices such as hearing protection if you work in environments  with loud noise or significant background noise.  Use safety glasses when doing any work that could send debris in to the eyes.  Use a helmet if you ride a bike or motorcycle.  Use appropriate safety gear for contact sports.  Talk to your caregiver about gun safety.  Use sunscreen with a SPF (or skin protection factor) of 15 or greater.  Lighter skinned people are at a greater risk of skin cancer. Don't forget to also wear sunglasses in order to protect your eyes from too much damaging sunlight. Damaging sunlight can accelerate cataract formation.   Practice safe sex. Use condoms. Condoms are used for birth control and to help reduce the spread of sexually transmitted infections (or STIs).  Some of the STIs are gonorrhea (the clap), chlamydia, syphilis, trichomonas, herpes, HPV (human papilloma virus) and HIV (human immunodeficiency virus) which causes AIDS. The herpes, HIV and HPV are viral illnesses that have no cure. These can result in disability, cancer and death.   Keep carbon monoxide and smoke detectors in your home functioning at all times. Change the batteries every 6 months or use a model that plugs into the  wall.   Vaccinations:  Stay up to date with your tetanus shots and other required immunizations. You should have a booster for tetanus every 10 years. Be sure to get your flu shot every year, since 5%-20% of the [place] population comes down with the flu. The flu vaccine changes each year, so being vaccinated once is not enough. Get your shot in the fall, before the flu season peaks.   Other vaccines to consider:  Human Papilloma Virus or HPV causes cancer of the cervix, and other infections that can be transmitted from person to person. There is a vaccine for HPV, and females should get immunized between the ages of 6 and 8. It requires a series of 3 shots.   Pneumococcal vaccine to protect against certain types of pneumonia.  This is normally recommended for adults age 19 or older.  However, adults younger than 32 years old with certain underlying conditions such as diabetes, heart or lung disease should also receive the vaccine.  Shingles vaccine to protect against Varicella Zoster if you are older than age 70, or younger than 32 years old with certain underlying illness.  Hepatitis A vaccine to protect against a form of infection of the liver by a virus acquired from food.  Hepatitis B vaccine to protect against a form of infection of the liver by a virus acquired from blood or body fluids, particularly if you work in health care.  If you plan to travel internationally, check with your local health department for specific vaccination recommendations.  Cancer Screening:  Breast cancer screening is essential to preventive care for women. All women age 16 and older should perform a breast self-exam every month. At age 57 and older, women should have their caregiver complete a breast exam each year. Women at ages 4 and older should have a mammogram (x-ray film) of the breasts. Your caregiver can discuss how often you need mammograms.    Cervical cancer screening includes taking a Pap smear  (sample of cells examined under a microscope) from the cervix (end of the uterus). It also includes testing for HPV (Human Papilloma Virus, which can cause cervical cancer). Screening and a pelvic exam should begin at age 5.. Screening should occur every year, with a Pap smear but no HPV testing, up to age 23. After age 75, you should  have a Pap smear every 3 years with HPV testing, if no HPV was found previously.   Most routine colon cancer screening begins at the age of 53. On a yearly basis, doctors may provide special easy to use take-home tests to check for hidden blood in the stool. Sigmoidoscopy or colonoscopy can detect the earliest forms of colon cancer and is life saving. These tests use a small camera at the end of a tube to directly examine the colon. Speak to your caregiver about this at age 64, when routine screening begins (and is repeated every 5 years unless early forms of pre-cancerous polyps or small growths are found).

## 2017-11-28 NOTE — Progress Notes (Signed)
Subjective:    Patient ID: Beverly Gonzalez, female    DOB: 09/10/1985, 32 y.o.   MRN: [id number]  HPI Chief Complaint  Patient presents with  . cpe    cpe, sees obgyn with [facility]. had lab work done on 09/18/17. did not want to leave a urine   She is here for a complete physical exam. She has her one year old son with her today.   No concerns or complaints.   Other providers: OB/GYN- Dr. Nelda Marseille   Social history: Lives with spouse and child, works as a physician  Denies smoking, drinking alcohol, drug use  Diet: healthy  Excerise: very active   Immunizations: HPV- she is interested in getting this vaccine and will check with her insurance.  Other immunizations are up to date  Health maintenance:  Mammogram: N/A Colonoscopy: 3 years ago for hematochezia. Normal colonoscopy per patient. This was in [place].  Last Gynecological Exam: Last pap smear in October 2018. Negative HPV Last Menstrual cycle: every 50 days or so.  Still pumping.   Wears seatbelt always, uses sunscreen, smoke detectors in home and functioning, does not text while driving and feels safe in home environment.   Reviewed allergies, medications, past medical, surgical, family, and social history.    Review of Systems Review of Systems Constitutional: -fever, -chills, -sweats, -unexpected weight change,-fatigue ENT: -runny nose, -ear pain, -sore throat Cardiology:  -chest pain, -palpitations, -edema Respiratory: -cough, -shortness of breath, -wheezing Gastroenterology: -abdominal pain, -nausea, -vomiting, -diarrhea, -constipation  Hematology: -bleeding or bruising problems Musculoskeletal: -arthralgias, -myalgias, -joint swelling, -back pain Ophthalmology: -vision changes Urology: -dysuria, -difficulty urinating, -hematuria, -urinary frequency, -urgency Neurology: -headache, -weakness, -tingling, -numbness       Objective:   Physical Exam BP 110/70   Pulse (!) 58   Ht 5\' 9"  (1.753 m)   Wt 139  lb 9.6 oz (63.3 kg)   LMP 11/07/2017   Breastfeeding? Yes Comment: pumping  BMI 20.62 kg/m   General Appearance:    Alert, cooperative, no distress, appears stated age  Head:    Normocephalic, without obvious abnormality, atraumatic  Eyes:    PERRL, conjunctiva/corneas clear, EOM's intact  Ears:    Normal TM's and external ear canals  Nose:   Nares normal, mucosa normal, no drainage  Throat:   Lips, mucosa, and tongue normal; teeth and gums normal  Neck:   Supple, normal ROM  Back:    Spine - no curvature, ROM normal, no CVA  tenderness  Lungs:     Clear to auscultation bilaterally without wheezes, rales or     ronchi; respirations unlabored  Chest Wall:    No tenderness or deformity   Heart:    Regular rate and rhythm, S1 and S2 normal, no murmur, rub   or gallop  Breast Exam:    Not done. OB/GYN  Abdomen:     Not done  Genitalia:   OB/GYN  Rectal:    Not performed due to age<40 and no related complaints  Extremities:   No clubbing, cyanosis or edema  Pulses:   2+ and symmetric all extremities  Skin:   Skin color, texture, turgor normal, no rashes or lesions  Lymph nodes:   Cervical, supraclavicular, and axillary nodes normal  Neurologic:   CNII-XII intact, normal strength, sensation and gait; reflexes 2+ and symmetric throughout          Psych:   Normal mood, affect, hygiene and grooming.     Urinalysis dipstick: declines  Assessment & Plan:  Routine general medical examination at a health care facility  She appears to be doing well both physically and emotionally. Healthy lifestyle. Discussed new guidelines for HPV vaccine up until age 61.  She will check with her insurance company and let me know if she decides to get this.  Otherwise she is up-to-date on immunizations. Up-to-date on health maintenance.  I did give her a list of dentists Reviewed recent labs that she brought with her and these will be scanned into the EMR. No blood work done today Follow-up as  needed or in 1 year for her physical

## 2017-12-01 ENCOUNTER — Encounter: Payer: Self-pay | Admitting: Family Medicine

## 2017-12-26 DIAGNOSIS — F411 Generalized anxiety disorder: Secondary | ICD-10-CM | POA: Diagnosis not present

## 2018-01-09 DIAGNOSIS — F411 Generalized anxiety disorder: Secondary | ICD-10-CM | POA: Diagnosis not present

## 2018-01-23 DIAGNOSIS — F411 Generalized anxiety disorder: Secondary | ICD-10-CM | POA: Diagnosis not present

## 2018-02-06 DIAGNOSIS — F411 Generalized anxiety disorder: Secondary | ICD-10-CM | POA: Diagnosis not present

## 2018-02-10 ENCOUNTER — Ambulatory Visit (INDEPENDENT_AMBULATORY_CARE_PROVIDER_SITE_OTHER): Payer: 59 | Admitting: Family Medicine

## 2018-02-10 ENCOUNTER — Encounter: Payer: Self-pay | Admitting: Family Medicine

## 2018-02-10 VITALS — BP 110/72 | HR 62 | Temp 98.2°F | Wt 137.8 lb

## 2018-02-10 DIAGNOSIS — Z113 Encounter for screening for infections with a predominantly sexual mode of transmission: Secondary | ICD-10-CM | POA: Diagnosis not present

## 2018-02-10 NOTE — Progress Notes (Signed)
   Subjective:    Patient ID: Beverly Gonzalez, female    DOB: 1986/02/19, 32 y.o.   MRN: [id number]  HPI Chief Complaint  Patient presents with  . std testing    std testing- husband cheated on her   She is here requesting STD testing.  States she recently found out that her husband was cheating on her.  Denies having any symptoms.  No other concerns today.   Review of Systems Pertinent positives and negatives in the history of present illness.     Objective:   Physical Exam BP 110/72   Pulse 62   Temp 98.2 F (36.8 C) (Oral)   Wt 137 lb 12.8 oz (62.5 kg)   LMP 01/29/2018   Breastfeeding? Yes   BMI 20.35 kg/m   Alert and oriented and in no acute distress.  Not otherwise examined.      Assessment & Plan:  Screen for STD (sexually transmitted disease) - Plan: RPR, HIV antibody, GC/Chlamydia Probe Amp  Will check HIV, RPR, GC/CT and follow-up.

## 2018-02-11 LAB — GC/CHLAMYDIA PROBE AMP
Chlamydia trachomatis, NAA: NEGATIVE
Neisseria gonorrhoeae by PCR: NEGATIVE

## 2018-02-11 LAB — HIV ANTIBODY (ROUTINE TESTING W REFLEX): HIV Screen 4th Generation wRfx: NONREACTIVE

## 2018-02-11 LAB — SYPHILIS: RPR W/REFLEX TO RPR TITER AND TREPONEMAL ANTIBODIES, TRADITIONAL SCREENING AND DIAGNOSIS ALGORITHM: RPR Ser Ql: NONREACTIVE

## 2018-02-12 ENCOUNTER — Ambulatory Visit: Payer: 59 | Admitting: Family Medicine

## 2018-02-12 DIAGNOSIS — Z0279 Encounter for issue of other medical certificate: Secondary | ICD-10-CM

## 2018-02-13 DIAGNOSIS — F411 Generalized anxiety disorder: Secondary | ICD-10-CM | POA: Diagnosis not present

## 2018-02-27 DIAGNOSIS — F411 Generalized anxiety disorder: Secondary | ICD-10-CM | POA: Diagnosis not present

## 2018-03-06 DIAGNOSIS — F411 Generalized anxiety disorder: Secondary | ICD-10-CM | POA: Diagnosis not present

## 2018-04-03 ENCOUNTER — Ambulatory Visit: Payer: 59 | Admitting: Psychiatry

## 2018-04-03 ENCOUNTER — Ambulatory Visit: Payer: Self-pay | Admitting: Psychiatry

## 2018-04-03 DIAGNOSIS — F411 Generalized anxiety disorder: Secondary | ICD-10-CM | POA: Diagnosis not present

## 2018-04-03 NOTE — Progress Notes (Signed)
      [facility] Counselor/Therapist Progress Note   Patient ID: Beverly Gonzalez, MRN: [id number]  Date: 04/03/2018  Timespent: 60 minutes  Treatment Type: Individual  Subjective: Patient in today with anxiety symptoms as well as grief re: marital separation.  Also experiencing disappointment, frustration, and sadness, and some decrease in her sleep.  Has not wanted to be on any medication due to her young son and is still pumping.  Worked on her primary issue right now which is the separation from her husband.  Focused on her feelings, thoughts, and improving self-care (eating healthy, exercise regularly, contact with friends).  Is to work on decreasing her negative self-talk and to practice saying nothing to herself that she would not say to a really good friend.  Does like her job and feels contentment there.  Is in midst of working out mutually agreeable visitation time for separated husband to have time with their toddler son.    Interventions:Solution Focused, Strength-based, Supportive and Reframing  Mental Status Exam:   Appearance:   Casual     Behavior:  Appropriate and Sharing  Motor:  Normal  Speech/Language:   Normal Rate  Affect:  Congruent  Mood:  anxious  Thought process:  Coherent  Thought content:    Logical  Perceptual disturbances:    Normal  Orientation:  Full (Time, Place, and Person)  Attention:  Good  Concentration:  good  Memory:  Immediate  Fund of knowledge:   Good  Insight:    Good  Judgment:   Good  Impulse Control:  good    Reported Symptoms:  Anxious, frustration, sadness, grief, disappointment  Risk Assessment: Danger to Self:  No Self-injurious Behavior: No Danger to Others: No Duty to Warn:no Physical Aggression / Violence:No  Access to Firearms a concern: No  Gang Involvement:No   Diagnosis:   ICD-10-CM   1. Generalized anxiety disorder F41.1      Plan: Will see patient in 1-2 weeks to continue goal-directed  treatment.  [name], LCSW

## 2018-04-10 ENCOUNTER — Ambulatory Visit (INDEPENDENT_AMBULATORY_CARE_PROVIDER_SITE_OTHER): Payer: 59 | Admitting: Psychiatry

## 2018-04-10 DIAGNOSIS — F411 Generalized anxiety disorder: Secondary | ICD-10-CM

## 2018-04-10 DIAGNOSIS — Z3009 Encounter for other general counseling and advice on contraception: Secondary | ICD-10-CM | POA: Diagnosis not present

## 2018-04-10 DIAGNOSIS — Z01419 Encounter for gynecological examination (general) (routine) without abnormal findings: Secondary | ICD-10-CM | POA: Diagnosis not present

## 2018-04-10 DIAGNOSIS — Z30015 Encounter for initial prescription of vaginal ring hormonal contraceptive: Secondary | ICD-10-CM | POA: Diagnosis not present

## 2018-04-10 NOTE — Progress Notes (Signed)
      [facility] Counselor/Therapist Progress Note   Patient ID: Beverly Gonzalez, MRN: [id number]  Date: 04/10/2018  Timespent: 60 minutes  Treatment Type: Individual  Subjective:   Patient in today feeling sad, angry, and frustrated.  Processed a lot of emotions and also expressed some positive insights and growth on her part.  Emphasized self-care (getting better sleep, time with friends, eating healthy, exercise, positive self-talk).  Planning some time away closer to upcoming holidays.  Family is supportive. Young son is her priority in all of the changes right now due to marital separation.    Interventions:Solution Focused, Strength-based and Supportive  Mental Status Exam:   Appearance:   Casual     Behavior:  Appropriate and Sharing  Motor:  Normal  Speech/Language:   Normal Rate  Affect:  Congruent  Mood:  angry and anxious  Thought process:  Relevant  Thought content:    Logical  Perceptual disturbances:    Normal  Orientation:  Good (full)  Attention:  Good  Concentration:  good  Memory:  Immediate  Fund of knowledge:   Good  Insight:    Good  Judgment:   Good  Impulse Control:  good    Reported Symptoms: Sadness, anger, frustration, regret  Risk Assessment: Danger to Self:  No Self-injurious Behavior: No Danger to Others: No Duty to Warn:no Physical Aggression / Violence:No  Access to Firearms a concern: No  Gang Involvement:No   Diagnosis:   ICD-10-CM   1. Generalized anxiety disorder F41.1      Plan: Will see patient again within 2 weeks to continue goal-directed treatment.  [name], LCSW

## 2018-04-17 ENCOUNTER — Ambulatory Visit (INDEPENDENT_AMBULATORY_CARE_PROVIDER_SITE_OTHER): Payer: 59 | Admitting: Psychiatry

## 2018-04-17 ENCOUNTER — Ambulatory Visit: Payer: 59 | Admitting: Psychiatry

## 2018-04-17 DIAGNOSIS — F411 Generalized anxiety disorder: Secondary | ICD-10-CM

## 2018-04-17 NOTE — Progress Notes (Signed)
      [facility] Counselor/Therapist Progress Note   Patient ID: Beverly Gonzalez, MRN: [id number]  Date: 04/17/2018  Timespent: 60 minutes   Treatment Type: Individual   Reported Symptoms: Fatigue and sad, angry, hurt, depressed, anxious, tearful   Mental Status Exam:    Appearance:   Casual     Behavior:  Appropriate and Sharing  Motor:  Normal  Speech/Language:   Normal Rate  Affect:  Tearful  Mood:  angry, anxious and depressed  Thought process:  normal  Thought content:    WNL  Sensory/Perceptual disturbances:    WNL  Orientation:  oriented to person, place, time/date, situation, day of week, month of year and year  Attention:  Good  Concentration:  Good  Memory:  WNL  Fund of knowledge:   Good  Insight:    Good  Judgment:   Good  Impulse Control:  Good     Risk Assessment: Danger to Self:  No Self-injurious Behavior: No Danger to Others: No Duty to Warn:no Physical Aggression / Violence:No  Access to Firearms a concern: No  Gang Involvement:No    Subjective:   Patient very tearful, angry, and hurt today as negative communication and behaviors continue in her interactions with separated husband.  Process her feelings at length and she stated she felt much better after being able to vent and share her "broken heart" and concerns.  Focused on strategies that promote a sense of being grounded and also reviewed/encouraged self-care.  Will see again within 1-2 wks.   Interventions: Solution-Oriented/Positive Psychology, Ego-Supportive and Insight-Oriented   Diagnosis:   ICD-10-CM   1. Generalized anxiety disorder F41.1      Plan: To see patient again within 1-2 weeks.   [name], LCSW

## 2018-05-18 ENCOUNTER — Ambulatory Visit (INDEPENDENT_AMBULATORY_CARE_PROVIDER_SITE_OTHER): Payer: 59 | Admitting: Psychiatry

## 2018-05-18 DIAGNOSIS — F411 Generalized anxiety disorder: Secondary | ICD-10-CM | POA: Diagnosis not present

## 2018-05-18 NOTE — Progress Notes (Signed)
      [facility] Counselor/Therapist Progress Note  Patient ID: Beverly Gonzalez, MRN: [id number],    Date: 05/18/2018  Time Spent:  60 minutes  Treatment Type: Individual Therapy  Reported Symptoms: Anxious Mood and Irritability, nervous about upcoming negotiation re: marital split and how separated husband may treat me   Mental Status Exam:  Appearance:   Casual     Behavior:  Appropriate and Sharing  Motor:  Normal  Speech/Language:   Normal Rate  Affect:  Appropriate  Mood:  anxious  Thought process:  normal  Thought content:    WNL  Sensory/Perceptual disturbances:    WNL  Orientation:  oriented to person, place, time/date, situation, day of week, month of year and year  Attention:  Good  Concentration:  Good  Memory:  WNL  Fund of knowledge:   Good  Insight:    Good  Judgment:   Good  Impulse Control:  Good   Risk Assessment: Danger to Self:  No Self-injurious Behavior: No Danger to Others: No Duty to Warn:no Physical Aggression / Violence:No  Access to Firearms a concern: No  Gang Involvement:No   Subjective:  Patient in today anxious, frustrated, fearful of upcoming divorce proceedings.  Also has more clarity about her own self and the belief that she can be ok and happy again. Discussed this at length and what she wants for herself and young toddler son.  Working on Armed forces logistics/support/administrative officer that will help her modulate her emotions and be better heard.  Strategies for this reviewed along with being able to say emotions uncensored when she is processing them herself.  Although concerned, patient does seem some more optimistic and resourceful.    Interventions: Solution-Oriented/Positive Psychology and Ego-Supportive  Diagnosis:   ICD-10-CM   1. Generalized anxiety disorder F41.1     Plan:  Patient to work on Armed forces logistics/support/administrative officer as discussed.  Also to read further in book Conscious Uncoupling that has some good points for patient to use in homework between  sessions.  Also involved with a group of other ladies dealing with relationship issues.  Will see in 2-3 wks.  [name], LCSW

## 2018-05-29 ENCOUNTER — Ambulatory Visit: Payer: 59 | Admitting: Psychiatry

## 2018-05-29 DIAGNOSIS — F411 Generalized anxiety disorder: Secondary | ICD-10-CM

## 2018-05-29 NOTE — Progress Notes (Signed)
      [facility] Counselor/Therapist Progress Note  Patient ID: Beverly Gonzalez, MRN: [id number],    Date: 05/29/2018  Time Spent: 60 minutes  Treatment Type: Individual Therapy  Reported Symptoms: anxiety, grief, sadness, relief  Mental Status Exam:  Appearance:   Casual     Behavior:  Appropriate and Sharing  Motor:  Normal  Speech/Language:   Normal Rate  Affect:  Tearful  Mood:  anxious and sad  Thought process:  normal  Thought content:    WNL  Sensory/Perceptual disturbances:    WNL  Orientation:  oriented to person, place, time/date, situation, day of week, month of year and year  Attention:  Good  Concentration:  Good  Memory:  WNL  Fund of knowledge:   Good  Insight:    Good  Judgment:   Good  Impulse Control:  Good   Risk Assessment: Danger to Self:  No Self-injurious Behavior: No Danger to Others: No Duty to Warn:no Physical Aggression / Violence:No  Access to Firearms a concern: No  Gang Involvement:No   Subjective: Patient in today with anxiety, grief over death of former coach, sadness, and relief that coach is no longer suffering.  Processed here grief well in session.  Plans to go visit parents and family in [place] during Christmas week, along with young son. Continues to work through her thoughts and feelings re: current marital separation.  Also working towards some eventual forgiveness of self and other people.    Interventions: Ego-Supportive and Insight-Oriented, Grief therapy  Diagnosis:   ICD-10-CM   1. Generalized anxiety disorder F41.1     Plan:  Patient to continue with strategies in working through her current separation and also with her grief over recent loss of long-time coach.  Will see again within 3 wks, based on her schedule and upcoming  travel.  [name], LCSW

## 2018-06-15 ENCOUNTER — Ambulatory Visit: Payer: 59 | Admitting: Psychiatry

## 2018-06-17 NOTE — L&D Delivery Note (Signed)
Delivery Note At 4:58 PM a viable female was delivered via VBAC, Spontaneous (Presentation: ROA ).  APGAR: 6, 9; weight 7 lb 5.8 oz (3340 g).   Placenta status: L&D .  Cord:  with the following complications: .  Cord pH: n/a  Anesthesia: epidural  Episiotomy: None Lacerations: bilateral Labial Suture Repair: 4-0 vicryl Est. Blood Loss (mL): 100  Mom to postpartum.  Baby to NICU.  Annalee Genta 04/08/2019, 7:18 PM

## 2018-06-19 ENCOUNTER — Ambulatory Visit: Payer: 59 | Admitting: Psychiatry

## 2018-06-19 ENCOUNTER — Ambulatory Visit (INDEPENDENT_AMBULATORY_CARE_PROVIDER_SITE_OTHER): Payer: 59 | Admitting: Psychiatry

## 2018-06-19 ENCOUNTER — Ambulatory Visit: Payer: 59 | Admitting: Internal Medicine

## 2018-06-19 DIAGNOSIS — F411 Generalized anxiety disorder: Secondary | ICD-10-CM

## 2018-06-19 NOTE — Progress Notes (Signed)
      [facility] Counselor/Therapist Progress Note  Patient ID: Beverly Gonzalez, MRN: [id number],    Date: 06/19/2018  Time Spent: 60 minutes  Treatment Type:  Individual therapy  Reported Symptoms:  Frustration, anxiety, irritable, fatigue  Mental Status Exam:  Appearance:   Casual     Behavior:  Appropriate and Sharing  Motor:  Normal  Speech/Language:   Normal Rate  Affect:  Congruent  Mood:  anxious, depressed and irritable  Thought process:  normal  Thought content:    WNL  Sensory/Perceptual disturbances:    WNL  Orientation:  oriented to person, place, time/date, situation, day of week, month of year and year  Attention:  Good  Concentration:  Good  Memory:  WNL  Fund of knowledge:   Good  Insight:    Good  Judgment:   Good  Impulse Control:  Good   Risk Assessment: Danger to Self:  No Self-injurious Behavior: No Danger to Others: No Duty to Warn:no Physical Aggression / Violence:No  Access to Firearms a concern: No  Gang Involvement:No   Subjective:   Patient in today with the above symptoms, however is also having moments of having "joy".  Anger, frustration, and disappointment are still present as she tries to interact with separated husband and especially in coparenting their young son.  Admits "I look often for the next shoe to drop". Encouraged patient to avoid the traps of negative thinking and shared some strategies for her to use in calming herself and also in creating a trend for herself to look for the positives in situations rather than assuming something may go wrong. Waves of emotions at times especially frustration.  Making more friends and having more social contacts as able.  She and young son had good holiday visit to see her parents and family.    Interventions: Cognitive Behavioral Therapy and Solution-Oriented/Positive Psychology  Diagnosis:   ICD-10-CM   1. Generalized anxiety disorder F41.1     Plan: Patient to continue work on  managing emotions she feels with marital separation and coparenting young toddler son.  To use strategies discussed in session to help her deal with difficult times currently and ones from the past as she wants to be able to let-go of what doesn't help her at this point.  Return in approx 2 wks.  [name], LCSW

## 2018-07-10 ENCOUNTER — Ambulatory Visit: Payer: 59 | Admitting: Psychiatry

## 2018-07-17 ENCOUNTER — Ambulatory Visit: Payer: 59 | Admitting: Psychiatry

## 2018-07-17 DIAGNOSIS — F411 Generalized anxiety disorder: Secondary | ICD-10-CM | POA: Diagnosis not present

## 2018-07-17 NOTE — Progress Notes (Signed)
      [facility] Counselor/Therapist Progress Note  Patient ID: Beverly Gonzalez, MRN: [id number],    Date: 06/19/2018  Time Spent: 58 minutes  Treatment Type:  Individual therapy  Reported Symptoms:  Frustration, anxiety  Mental Status Exam:  Appearance:   Casual     Behavior:  Appropriate and Sharing  Motor:  Normal  Speech/Language:   Normal Rate  Affect:  Congruent  Mood:  anxious  Thought process:  normal  Thought content:    WNL  Sensory/Perceptual disturbances:    WNL  Orientation:  oriented to person, place, time/date, situation, day of week, month of year and year  Attention:  Good  Concentration:  Good  Memory:  WNL  Fund of knowledge:   Good  Insight:    Good  Judgment:   Good  Impulse Control:  Good   Risk Assessment: Danger to Self:  No Self-injurious Behavior: No Danger to Others: No Duty to Warn:no Physical Aggression / Violence:No  Access to Firearms a concern: No  Gang Involvement:No   Subjective:   Patient in today and noticeably better.  Some decrease in anxiety and increase in self-esteem and hope.  Still admits "I look often for the next shoe to drop".  Also still making lots of negative assumptions which we worked more on today.  Encouraged patient to avoid the traps of negative thinking and shared some strategies for her to use in calming herself and also in creating a trend for herself to look for the positives in situations rather than assuming something may go wrong. Still having some waves of emotions at times but not as often.  Making more friends and having more social contacts as able which has been really helpful for her. Has bought a home and will be moving later next month.    Interventions: Cognitive Behavioral Therapy and Solution-Oriented/Positive Psychology  Diagnosis:   ICD-10-CM   1. Generalized anxiety disorder F41.1     Plan: Patient to continue work on managing emotions she feels with marital separation and coparenting  young toddler son.  To use strategies discussed in session to help her deal with difficult times currently and ones from the past as she wants to be able to let-go of what doesn't help her at this point.  Return in approx 2 wks.  [name], LCSW

## 2018-07-24 ENCOUNTER — Encounter: Payer: Self-pay | Admitting: Internal Medicine

## 2018-07-24 ENCOUNTER — Ambulatory Visit (INDEPENDENT_AMBULATORY_CARE_PROVIDER_SITE_OTHER): Payer: 59 | Admitting: Internal Medicine

## 2018-07-24 VITALS — BP 102/68 | HR 60 | Temp 97.5°F | Ht 70.0 in | Wt 145.9 lb

## 2018-07-24 DIAGNOSIS — O1423 HELLP syndrome (HELLP), third trimester: Secondary | ICD-10-CM | POA: Diagnosis not present

## 2018-07-24 DIAGNOSIS — F411 Generalized anxiety disorder: Secondary | ICD-10-CM | POA: Diagnosis not present

## 2018-07-24 NOTE — Progress Notes (Signed)
New Patient Office Visit     CC/Reason for Visit: establish care, follow up chronic conditions Previous PCP: Harland Dingwall NP Last Visit: October 2019  HPI: Beverly Gonzalez is a 33 y.o. female who is coming in today for the above mentioned reasons. Past Medical History is significant for: HELLP during prior pregnancy resulting in early delivery via c-section, some anxiety for which she takes no medications but sees a counselor routinely. She has no acute complaints today. She is also seeing a OB-GYN and is currently discussing birth control methods with her.   Past Medical/Surgical History: Past Medical History:  Diagnosis Date  . Severe preeclampsia, third trimester 11/18/2016    Past Surgical History:  Procedure Laterality Date  . CESAREAN SECTION N/A 11/20/2016   Procedure: CESAREAN SECTION;  Surgeon: Janyth Pupa, DO;  Location: [facility];  Service: Obstetrics;  Laterality: N/A;  . TONSILLECTOMY    . WISDOM TOOTH EXTRACTION      Social History:  reports that she has never smoked. She has never used smokeless tobacco. She reports that she does not drink alcohol or use drugs.  Allergies: No Known Allergies  Family History:  Family History  Problem Relation Age of Onset  . Hypertension Mother   . Diabetes Father   . Hypertension Father   . Heart disease Maternal Grandmother   . Heart disease Maternal Grandfather   . Heart attack Maternal Grandfather        81  . Diabetes Paternal Grandfather   . Heart disease Paternal Grandfather   . Stroke Paternal Grandfather      Current Outpatient Medications:  .  Prenatal Vit-Fe Fumarate-FA (PRENATAL PO), Take 1 tablet by mouth daily., Disp: , Rfl:   Review of Systems:  Constitutional: Denies fever, chills, diaphoresis, appetite change and fatigue.  HEENT: Denies photophobia, eye pain, redness, hearing loss, ear pain, congestion, sore throat, rhinorrhea, sneezing, mouth sores, trouble swallowing, neck pain,  neck stiffness and tinnitus.   Respiratory: Denies SOB, DOE, cough, chest tightness,  and wheezing.   Cardiovascular: Denies chest pain, palpitations and leg swelling.  Gastrointestinal: Denies nausea, vomiting, abdominal pain, diarrhea, constipation, blood in stool and abdominal distention.  Genitourinary: Denies dysuria, urgency, frequency, hematuria, flank pain and difficulty urinating.  Endocrine: Denies: hot or cold intolerance, sweats, changes in hair or nails, polyuria, polydipsia. Musculoskeletal: Denies myalgias, back pain, joint swelling, arthralgias and gait problem.  Skin: Denies pallor, rash and wound.  Neurological: Denies dizziness, seizures, syncope, weakness, light-headedness, numbness and headaches.  Hematological: Denies adenopathy. Easy bruising, personal or family bleeding history  Psychiatric/Behavioral: Denies suicidal ideation, mood changes, confusion, nervousness, sleep disturbance and agitation    Physical Exam: Vitals:   07/24/18 0933  BP: 102/68  Pulse: 60  Temp: (!) 97.5 F (36.4 C)  TempSrc: Oral  SpO2: 97%  Weight: 145 lb 14.4 oz (66.2 kg)  Height: 5\' 10"  (1.778 m)   Body mass index is 20.93 kg/m.  Constitutional: NAD, calm, comfortable Eyes: PERRL, lids and conjunctivae normal ENMT: Mucous membranes are moist.  Tympanic membrane is pearly white, no erythema or bulging. Neck: normal, supple, no masses, no thyromegaly Respiratory: clear to auscultation bilaterally, no wheezing, no crackles. Normal respiratory effort. No accessory muscle use.  Cardiovascular: Regular rate and rhythm, no murmurs / rubs / gallops. No extremity edema. 2+ pedal pulses. No carotid bruits.  Musculoskeletal: no clubbing / cyanosis. No joint deformity upper and lower extremities. Good ROM, no contractures. Normal muscle tone.  Psychiatric: Normal  judgment and insight. Alert and oriented x 3. Normal mood.    Impression and Plan:  Generalized anxiety disorder -Well  controlled, not on meds, follows routinely with counselor.  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 -Has started taking baby aspirin.       Lelon Frohlich, MD [facility]

## 2018-08-07 ENCOUNTER — Ambulatory Visit (INDEPENDENT_AMBULATORY_CARE_PROVIDER_SITE_OTHER): Payer: 59 | Admitting: Psychiatry

## 2018-08-07 DIAGNOSIS — F411 Generalized anxiety disorder: Secondary | ICD-10-CM | POA: Diagnosis not present

## 2018-08-07 NOTE — Progress Notes (Signed)
      [facility] Counselor/Therapist Progress Note  Patient ID: Beverly Gonzalez, MRN: [id number],    Date: 06/19/2018  Time Spent: 35 minutes  Treatment Type:  Individual therapy  Reported Symptoms:  anxiety  Mental Status Exam:  Appearance:   Casual     Behavior:  Appropriate and Sharing  Motor:  Normal  Speech/Language:   Normal Rate  Affect:  Congruent  Mood:  anxious  Thought process:  normal  Thought content:    WNL  Sensory/Perceptual disturbances:    WNL  Orientation:  oriented to person, place, time/date, situation, day of week, month of year and year  Attention:  Good  Concentration:  Good  Memory:  WNL  Fund of knowledge:   Good  Insight:    Good  Judgment:   Good  Impulse Control:  Good   Risk Assessment: Danger to Self:  No Self-injurious Behavior: No Danger to Others: No Duty to Warn:no Physical Aggression / Violence:No  Access to Firearms a concern: No  Gang Involvement:No   Subjective:    Session today more brief  (35 minute) due to time constraints of an unexpected emergency.  Patient arrived very anxious and with a sense of urgency. Shared personal information that she does not wish to be shared in Epic system.  Discussed this stressful situation in detail and patient plans to follow up again with her regular appt within 2 weeks.  No SI /HI ideation and was some calmer after talking today.  Parent are here visiting for the weekend and she plans to talk more openly with the before they leave early next week.  Encouraged good self-care with patient and she clearly understood and shared what she knew being in health care, how to take good care of herself emotionally and physically.    Interventions: Cognitive Behavioral Therapy and Solution-Oriented/Positive Psychology  Diagnosis:   ICD-10-CM   1. Generalized anxiety disorder F41.1     Plan: Patient to continue work on managing emotions she feels with marital separation and coparenting  young toddler son.  To use strategies discussed in session to help her deal with difficult times currently and ones from the past as she wants to be able to let-go of what doesn't help her at this point.  Return in approx 2 wks.  [name], LCSW

## 2018-08-21 ENCOUNTER — Ambulatory Visit (INDEPENDENT_AMBULATORY_CARE_PROVIDER_SITE_OTHER): Payer: 59 | Admitting: Psychiatry

## 2018-08-21 DIAGNOSIS — Z3201 Encounter for pregnancy test, result positive: Secondary | ICD-10-CM | POA: Diagnosis not present

## 2018-08-21 DIAGNOSIS — F411 Generalized anxiety disorder: Secondary | ICD-10-CM | POA: Diagnosis not present

## 2018-08-21 DIAGNOSIS — Z3490 Encounter for supervision of normal pregnancy, unspecified, unspecified trimester: Secondary | ICD-10-CM | POA: Diagnosis not present

## 2018-08-21 DIAGNOSIS — R3989 Other symptoms and signs involving the genitourinary system: Secondary | ICD-10-CM | POA: Diagnosis not present

## 2018-08-21 NOTE — Progress Notes (Signed)
      [facility] Counselor/Therapist Progress Note  Patient ID: Beverly Gonzalez, MRN: [id number],    Date: 06/19/2018  Time Spent: 60 minutes  Treatment Type:  Individual therapy  Reported Symptoms:  anxiety  Mental Status Exam:  Appearance:   Casual     Behavior:  Appropriate and Sharing  Motor:  Normal  Speech/Language:   Normal Rate  Affect:  Congruent  Mood:  anxious  Thought process:  normal  Thought content:    WNL  Sensory/Perceptual disturbances:    WNL  Orientation:  oriented to person, place, time/date, situation, day of week, month of year and year  Attention:  Good  Concentration:  Good  Memory:  WNL  Fund of knowledge:   Good  Insight:    Good  Judgment:   Good  Impulse Control:  Good   Risk Assessment: Danger to Self:  No Self-injurious Behavior: No Danger to Others: No Duty to Warn:no Physical Aggression / Violence:No  Access to Firearms a concern: No  Gang Involvement:No   Subjective:     Patient arrived very anxious again this visit but with not as much urgency as prior visit.  Shared personal information that she does not wish to be shared in Epic system.  Discussed more of this stressful situation in detail and patient plans to follow up again with her regular appt within 2 weeks.  No SI /HI ideation and was some calmer after talking today.  Parents were her for another visit recently and that went fine.  Encouraged good self-care with patient and she clearly understood and shared what she knew being in health care, how to take good care of herself emotionally and physically.    Interventions: Cognitive Behavioral Therapy and Solution-Oriented/Positive Psychology  Diagnosis:   ICD-10-CM   1. Generalized anxiety disorder F41.1     Plan: Patient to continue work on managing emotions she feels with marital separation and coparenting young toddler son.  To use strategies discussed in session to help her deal with current difficult times  coupled with anxiety, and issues from the past as she wants to be able to let-go more of past in order to move forward as she is able  Return in approx 2 wks.  [name], LCSW

## 2018-08-26 DIAGNOSIS — Z3687 Encounter for antenatal screening for uncertain dates: Secondary | ICD-10-CM | POA: Diagnosis not present

## 2018-08-26 DIAGNOSIS — Z3490 Encounter for supervision of normal pregnancy, unspecified, unspecified trimester: Secondary | ICD-10-CM | POA: Diagnosis not present

## 2018-08-28 ENCOUNTER — Ambulatory Visit (INDEPENDENT_AMBULATORY_CARE_PROVIDER_SITE_OTHER): Payer: 59 | Admitting: Psychiatry

## 2018-08-28 ENCOUNTER — Other Ambulatory Visit: Payer: Self-pay

## 2018-08-28 DIAGNOSIS — F411 Generalized anxiety disorder: Secondary | ICD-10-CM | POA: Diagnosis not present

## 2018-08-28 NOTE — Progress Notes (Signed)
      [facility] Counselor/Therapist Progress Note  Patient ID: Beverly Gonzalez, MRN: [id number],    Date: 06/19/2018  Time Spent: 60 minutes  Treatment Type:  Individual therapy  Reported Symptoms:  anxiety  Mental Status Exam:  Appearance:   Casual     Behavior:  Appropriate and Sharing  Motor:  Normal  Speech/Language:   Normal Rate  Affect:  Congruent  Mood:  anxious  Thought process:  normal  Thought content:    WNL  Sensory/Perceptual disturbances:    WNL  Orientation:  oriented to person, place, time/date, situation, day of week, month of year and year  Attention:  Good  Concentration:  Good  Memory:  WNL  Fund of knowledge:   Good  Insight:    Good  Judgment:   Good  Impulse Control:  Good   Risk Assessment: Danger to Self:  No Self-injurious Behavior: No Danger to Others: No Duty to Warn:no Physical Aggression / Violence:No  Access to Firearms a concern: No  Gang Involvement:No   Subjective:   Patient in today with anxiety continuing re: family situation that is continuing to evolve.  Anxiety is bit more lessened at times, as patient continues to work at better managing it.     Shared personal information that she does not wish to be shared in Epic system.  Discussed more of this stressful situation in detail and patient plans to follow up again with her appt within 2 weeks.  No SI /HI ideation and was more calm today than prior appt. Parents live in out of state in [place] and remain supportive. Worked also today on communication and how to be better heard in difficult conversations.  Encouraged good self-care with patient and to be mindful in her communications that "how we say things" is as important as "what we say",  especially in difficult or tense communications.   Interventions: Cognitive Behavioral Therapy and Solution-Oriented/Positive Psychology  Diagnosis:   ICD-10-CM   1. Generalized anxiety disorder F41.1     Plan: Patient in today  to continue work on managing emotions she feels with marital separation and coparenting young toddler son.  To use strategies discussed in session to help her deal with current difficult times coupled with anxiety, and issues from the past as she wants to be able to let-go more of past in order to move forward as she is able.  Is making progress in letting go of past. Return in approx 2 wks.  [name], LCSW

## 2018-09-14 LAB — OB RESULTS CONSOLE ANTIBODY SCREEN: Antibody Screen: NEGATIVE

## 2018-09-14 LAB — OB RESULTS CONSOLE ABO/RH: RH Type: POSITIVE

## 2018-09-14 LAB — OB RESULTS CONSOLE HEPATITIS B SURFACE ANTIGEN: Hepatitis B Surface Ag: NEGATIVE

## 2018-09-14 LAB — OB RESULTS CONSOLE HIV ANTIBODY (ROUTINE TESTING): HIV: NONREACTIVE

## 2018-09-14 LAB — OB RESULTS CONSOLE RPR: RPR: NONREACTIVE

## 2018-09-14 LAB — OB RESULTS CONSOLE RUBELLA ANTIBODY, IGM: Rubella: IMMUNE

## 2018-09-14 LAB — OB RESULTS CONSOLE GC/CHLAMYDIA
Chlamydia: NEGATIVE
Gonorrhea: NEGATIVE

## 2018-09-17 DIAGNOSIS — O0991 Supervision of high risk pregnancy, unspecified, first trimester: Secondary | ICD-10-CM | POA: Diagnosis not present

## 2018-09-18 ENCOUNTER — Ambulatory Visit (INDEPENDENT_AMBULATORY_CARE_PROVIDER_SITE_OTHER): Payer: 59 | Admitting: Psychiatry

## 2018-09-18 ENCOUNTER — Other Ambulatory Visit: Payer: Self-pay

## 2018-09-18 DIAGNOSIS — F411 Generalized anxiety disorder: Secondary | ICD-10-CM | POA: Diagnosis not present

## 2018-09-18 NOTE — Progress Notes (Signed)
[facility] Counselor/Therapist Progress Note  Patient ID: Beverly Gonzalez, MRN: [id number],    Date: 06/19/2018  Time Spent: 60 minutes    8:00am to 9:00am  Treatment Type:  Individual therapy  Virtual Visit via Telephone Note I connected with patient by a video enabled telemedicine application or telephone, with their informed consent, and verified patient privacy and that I am speaking with the correct person using two identifiers.  I am at [place] and patient is at home.    I discussed the limitations, risks, security and privacy concerns of performing psychotherapy and management service by telephone and the availability of in person appointments. I also discussed with the patient that there may be a patient responsible charge related to this service. The patient expressed understanding and agreed to proceed.  I discussed the treatment planning with the patient. The patient was provided an opportunity to ask questions and all were answered. The patient agreed with the plan and demonstrated an understanding of the instructions.   The patient was advised to call  our office if  symptoms worsen or feel they are in a crisis state and need immediate contact.  Reported Symptoms:  Anxiety (increased)  Mental Status Exam:  Appearance:     n/a   Behavior:  Sharing  Motor:  Normal  Speech/Language:   Normal Rate  Affect:  n/a  Mood:  anxious  Thought process:  normal  Thought content:    WNL  Sensory/Perceptual disturbances:    WNL  Orientation:  oriented to person, place, time/date, situation, day of week, month of year and year  Attention:  Good  Concentration:  Good  Memory:  WNL  Fund of knowledge:   Good  Insight:    Good  Judgment:   Good  Impulse Control:  Good   Risk Assessment: Danger to Self:  No Self-injurious Behavior: No Danger to Others: No Duty to Warn:no Physical Aggression / Violence:No  Access to Firearms a concern: No   Gang Involvement:No   Subjective:   Patient today reports increased anxiety which we processed at length in our session today. (not all info shared on Epic). Patient Is 10 weeks into her pregnancy, having excessive nausea, emotional volatility, anger at separated husband, quick to react, and difficulty in communicating in healthy ways. Denies any SI nor HI.    Trying to establish more support through Zoom app with sisters, one who just moved to the [place], and twin sister in [place], one a Pharmacist, hospital in [place], and one who is a Marine scientist at [employer] in [place]. Several issues that are potentially volatile with them but patient agreed to try the group virtual contacts. Talked about how she might handle issues of concern in case that happens. Worked more today on communication when stressed and in difficult situations.  Encouraged good self-care with patient and to be mindful in her communications that "how we say things" is as important as "what we say",  especially in difficult or tense communications.   Interventions: Cognitive Behavioral Therapy and Solution-Oriented/Positive Psychology  Diagnosis:   ICD-10-CM   1. Generalized anxiety disorder F41.1     Plan: Patient in today to continue work on managing emotions she feels with marital separation and coparenting young toddler son.  To use strategies discussed in session to help her deal with current difficult times coupled with anxiety, and issues from the past as she wants to be able to let-go more of past and present, and be heathy  moving into the future. Progress noted in goal review. Return in approx 2 wks.  [name], LCSW

## 2018-09-22 DIAGNOSIS — O0991 Supervision of high risk pregnancy, unspecified, first trimester: Secondary | ICD-10-CM | POA: Diagnosis not present

## 2018-09-22 DIAGNOSIS — Z3481 Encounter for supervision of other normal pregnancy, first trimester: Secondary | ICD-10-CM | POA: Diagnosis not present

## 2018-10-02 ENCOUNTER — Ambulatory Visit (INDEPENDENT_AMBULATORY_CARE_PROVIDER_SITE_OTHER): Payer: 59 | Admitting: Psychiatry

## 2018-10-02 ENCOUNTER — Other Ambulatory Visit: Payer: Self-pay

## 2018-10-02 DIAGNOSIS — F411 Generalized anxiety disorder: Secondary | ICD-10-CM | POA: Diagnosis not present

## 2018-10-02 NOTE — Progress Notes (Addendum)
[facility] Counselor/Therapist Progress Note  Patient ID: Geneviene Tesch, MRN: [id number],    Date: 10/02/2018  Time Spent: 60 minutes    8:00am to 9:00am  Treatment Type:  Individual therapy  Virtual Visit  Note I connected with patient by a video enabled telemedicine application or telephone, with their informed consent, and verified patient privacy and that I am speaking with the correct person using two identifiers. I am at [place] and patient is at home.    I discussed the limitations, risks, security and privacy concerns of performing psychotherapy and management service by telephone and the availability of in person appointments. I also discussed with the patient that there may be a patient responsible charge related to this service. The patient expressed understanding and agreed to proceed.  I discussed the treatment planning with the patient. The patient was provided an opportunity to ask questions and all were answered. The patient agreed with the plan and demonstrated an understanding of the instructions.   The patient was advised to call  our office if  symptoms worsen or feel they are in a crisis state and need immediate contact.  Reported Symptoms:  Anxiety   Mental Status Exam:  Appearance:     n/a   Behavior:  Sharing  Motor:  Normal  Speech/Language:   Normal Rate  Affect:  n/a  Mood:  anxious  Thought process:  normal  Thought content:    WNL  Sensory/Perceptual disturbances:    WNL  Orientation:  oriented to person, place, time/date, situation, day of week, month of year and year  Attention:  Good  Concentration:  Good  Memory:  WNL  Fund of knowledge:   Good  Insight:    Good  Judgment:   Good  Impulse Control:  Good   Risk Assessment: Danger to Self:  No Self-injurious Behavior: No Danger to Others: No Duty to Warn:no Physical Aggression / Violence:No  Access to Firearms a concern: No  Gang Involvement:No    Subjective:    Patient reports continuing anxiety with family issues, adjusting to new home, and is almost [redacted] wks pregnant.  Had intense nausea previously but is better now and on different nausea med (Zofran added to Phenergan).  Her emotional volatility has lessened, and reports less irritability, and has improved some in communication and more aware of some communication issues that she still wants to work on, especially on "how we say what we say" and "some of my word choices and using I-statements".  We did discuss some of her communication when stressed and in difficult situations and how to modulate her emotions some and be more aware of how she is speaking at the time.  Will continue to work on this.  Had a virtual visit with extended family recently via Zoom app.  It actually went pretty well and patient plans to continue this.    Encouraged good self-care and continued goal-directed strategies.   Interventions: Cognitive Behavioral Therapy and Solution-Oriented/Positive Psychology  Diagnosis:   ICD-10-CM   1. Generalized anxiety disorder F41.1     Plan: Patient in today to continue work on managing emotions she feels with marital separation and coparenting young toddler son.  To use strategies discussed in session to help her deal with current difficult times coupled with anxiety, and issues from the past as she wants to be able to let-go more of past and present, and be heathy moving into the future. Progress noted in goal review  with patient.  Next appt approx 2 wks.  [name], LCSW

## 2018-10-16 ENCOUNTER — Ambulatory Visit (INDEPENDENT_AMBULATORY_CARE_PROVIDER_SITE_OTHER): Payer: 59 | Admitting: Psychiatry

## 2018-10-16 ENCOUNTER — Other Ambulatory Visit (HOSPITAL_COMMUNITY): Payer: Self-pay | Admitting: Obstetrics & Gynecology

## 2018-10-16 ENCOUNTER — Other Ambulatory Visit: Payer: Self-pay

## 2018-10-16 DIAGNOSIS — F411 Generalized anxiety disorder: Secondary | ICD-10-CM

## 2018-10-16 DIAGNOSIS — O09299 Supervision of pregnancy with other poor reproductive or obstetric history, unspecified trimester: Secondary | ICD-10-CM

## 2018-10-16 NOTE — Progress Notes (Signed)
[facility] Counselor/Therapist Progress Note  Patient ID: Beverly Gonzalez, MRN: [id number],    Date: 10/02/2018  Time Spent: 60 minutes   11:00am to 12noon  Treatment Type:  Individual therapy  Virtual Visit Note : I connected with patient by a video enabled telemedicine application or telephone, with their informed consent, and verified patient privacy and that I am speaking with the correct person using two identifiers. I am at [place] and patient is at home.    I discussed the limitations, risks, security and privacy concerns of performing psychotherapy and management service by telephone and the availability of in person appointments. I also discussed with the patient that there may be a patient responsible charge related to this service. The patient expressed understanding and agreed to proceed.  I discussed the treatment planning with the patient. The patient was provided an opportunity to ask questions and all were answered. The patient agreed with the plan and demonstrated an understanding of the instructions.   The patient was advised to call  our office if  symptoms worsen or feel they are in a crisis state and need immediate contact.  Reported Symptoms:  Anxiety, stress, pregnancy going ok so far and is being followed by a high-risk doctor  Mental Status Exam:  Appearance:     n/a   Behavior:  Sharing  Motor:  Normal  Speech/Language:   Normal Rate  Affect:  n/a  Mood:  anxious  Thought process:  normal  Thought content:    WNL  Sensory/Perceptual disturbances:    WNL  Orientation:  oriented to person, place, time/date, situation, day of week, month of year and year  Attention:  Good  Concentration:  Good  Memory:  WNL  Fund of knowledge:   Good  Insight:    Good  Judgment:   Good  Impulse Control:  Good   Risk Assessment: Danger to Self:  No Self-injurious Behavior: No Danger to Others: No Duty to Warn:no Physical Aggression /  Violence:No  Access to Firearms a concern: No  Gang Involvement:No   Subjective:    Patient reporting continued anxiety and stress in midst of volatile pandemic environment. Is having real difficult time trying to co-parent with father of her young son.  Also in midst of delicate family situations that are very stressful and anxiety producing.  Discussed these at length with patient today as well as strategies to help her better manage her hurt, anger, frustration, and anxiety.  Strongly encouraged her to work on staying in the present and not looking back to past nor too far ahead in the future.  Looked at her strength and tried to build on them.  Has some good support people on a personal and professional level.  Family supportive as well.  Importance placed on her and her own health right now,  As she continues to take good care of her young son and co-parents him with his dad.  Talked specifically about ways to "let go" of hurtful actions of others so that she can move forward without the emotional weight of those actions. Due date for baby 04/13/2019.  Encouraged good self-care and continued goal-directed strategies.   Interventions: Cognitive Behavioral Therapy and Solution-Oriented/Positive Psychology  Diagnosis:   ICD-10-CM   1. Generalized anxiety disorder F41.1     Plan: Patient had virtual telehealth appt today to continue work on managing emotions she feels with marital separation and coparenting young toddler son.  To use strategies discussed  in session to help her deal with current difficult times coupled with anxiety, and issues from the past as she wants to be able to let-go more of past and present, and be heathy moving into the future. Progress noted in goal review with patient.  Next appt approx 2 wks.  [name], LCSW

## 2018-10-30 ENCOUNTER — Other Ambulatory Visit: Payer: Self-pay

## 2018-10-30 ENCOUNTER — Ambulatory Visit (INDEPENDENT_AMBULATORY_CARE_PROVIDER_SITE_OTHER): Payer: 59 | Admitting: Psychiatry

## 2018-10-30 DIAGNOSIS — F411 Generalized anxiety disorder: Secondary | ICD-10-CM

## 2018-10-30 NOTE — Progress Notes (Signed)
[facility] Counselor/Therapist Progress Note  Patient ID: Beverly Gonzalez, MRN: [id number],    Date: 10/29/2018  Time Spent: 60 minutes   8:00am to 9:00am  Treatment Type:  Individual therapy  Virtual Visit Note : I connected with patient by a video enabled telemedicine application or telephone, with their informed consent, and verified patient privacy and that I am speaking with the correct person using two identifiers. I am at [place] and patient is at home.    I discussed the limitations, risks, security and privacy concerns of performing psychotherapy and management service by telephone and the availability of in person appointments. I also discussed with the patient that there may be a patient responsible charge related to this service. The patient expressed understanding and agreed to proceed.  I discussed the treatment planning with the patient. The patient was provided an opportunity to ask questions and all were answered. The patient agreed with the plan and demonstrated an understanding of the instructions.   The patient was advised to call  our office if  symptoms worsen or feel they are in a crisis state and need immediate contact.  Reported Symptoms:  Anxiety, stress, pregnancy going ok so far and is being followed by a high-risk doctor, frustration, fatigue  Mental Status Exam:  Appearance:     n/a   Behavior:  Sharing  Motor:  Normal  Speech/Language:   Normal Rate  Affect:  n/a  Mood:  anxious  Thought process:  normal  Thought content:    WNL  Sensory/Perceptual disturbances:    WNL  Orientation:  oriented to person, place, time/date, situation, day of week, month of year and year  Attention:  Good  Concentration:  Good  Memory:  WNL  Fund of knowledge:   Good  Insight:    Good  Judgment:   Good  Impulse Control:  Good   Risk Assessment: Danger to Self:  No Self-injurious Behavior: No Danger to Others: No Duty to  Warn:no Physical Aggression / Violence:No  Access to Firearms a concern: No  Gang Involvement:No   Subjective:    Patient reporting continued anxiety, frustration, fatigue, and stress in midst of volatile pandemic environment.  Lots of difficulty in trying to work with separated husband in co-parenting their young son.  Communication between the two of them is very conflictual.  Worked with patient on communication, including choosing her words and active listening. Also focused on setting appropriate limits, being able to set limits on reading numerous argumentative texts/emails from separated husband.  This is difficult and patient realizes it will take a lot of strength on her part and it is also in her best interest to be more calm and leveled out during her pregnancy at this point. Reports pregnancy is going well so far and is followed by "high risk doctor" due to issues with previous pregnancy.  In regards to issues with separated husband, patient was encouraged her to work on staying in the present and not looking back to past nor too far ahead in the future. Also urged, especially in that relationship and trying to co-parent, to practice "looking for what may go right versus wrong."    Looked at her strengths and tried to build on them.  Has some good support people on a personal and professional level.  Family supportive as well.  Importance placed on her and her own health right now,  As she continues to take good care of her young son  and co-parents him with his dad.  Talked specifically about ways to "let go" of hurtful actions of others so that she can move forward without the emotional weight of those actions. Due date for baby 04/13/2019.  Encouraged good self-care and continued goal-directed strategies.   Interventions: Cognitive Behavioral Therapy and Solution-Oriented/Positive Psychology  Diagnosis:   ICD-10-CM   1. Generalized anxiety disorder F41.1     Plan: Patient had  virtual telehealth appt today to continue work on managing emotions she feels with marital separation and coparenting young toddler son.  To use strategies discussed in session to help her deal with current difficult times coupled with anxiety, and issues from the past as she wants to be able to let-go more of past and present, and be heathy moving into the future. Progress noted in goal review with patient.  Next appt approx 2 wks.  [name], LCSW

## 2018-11-10 ENCOUNTER — Encounter (HOSPITAL_COMMUNITY): Payer: Self-pay | Admitting: *Deleted

## 2018-11-13 ENCOUNTER — Ambulatory Visit (HOSPITAL_COMMUNITY): Payer: 59 | Admitting: *Deleted

## 2018-11-13 ENCOUNTER — Other Ambulatory Visit: Payer: Self-pay

## 2018-11-13 ENCOUNTER — Encounter (HOSPITAL_COMMUNITY): Payer: Self-pay

## 2018-11-13 ENCOUNTER — Other Ambulatory Visit (HOSPITAL_COMMUNITY): Payer: Self-pay | Admitting: *Deleted

## 2018-11-13 ENCOUNTER — Ambulatory Visit (HOSPITAL_BASED_OUTPATIENT_CLINIC_OR_DEPARTMENT_OTHER): Payer: 59 | Admitting: Maternal & Fetal Medicine

## 2018-11-13 ENCOUNTER — Ambulatory Visit (HOSPITAL_COMMUNITY)
Admission: RE | Admit: 2018-11-13 | Discharge: 2018-11-13 | Disposition: A | Discharge status: Home / Self Care | Payer: 59 | Source: Ambulatory Visit | Attending: Obstetrics and Gynecology | Admitting: Obstetrics and Gynecology

## 2018-11-13 ENCOUNTER — Ambulatory Visit (INDEPENDENT_AMBULATORY_CARE_PROVIDER_SITE_OTHER): Payer: 59 | Admitting: Psychiatry

## 2018-11-13 VITALS — BP 115/70 | HR 83 | Temp 98.3°F

## 2018-11-13 DIAGNOSIS — O0990 Supervision of high risk pregnancy, unspecified, unspecified trimester: Secondary | ICD-10-CM | POA: Insufficient documentation

## 2018-11-13 DIAGNOSIS — O09292 Supervision of pregnancy with other poor reproductive or obstetric history, second trimester: Secondary | ICD-10-CM

## 2018-11-13 DIAGNOSIS — O09299 Supervision of pregnancy with other poor reproductive or obstetric history, unspecified trimester: Secondary | ICD-10-CM | POA: Insufficient documentation

## 2018-11-13 DIAGNOSIS — Z363 Encounter for antenatal screening for malformations: Secondary | ICD-10-CM

## 2018-11-13 DIAGNOSIS — O09892 Supervision of other high risk pregnancies, second trimester: Secondary | ICD-10-CM | POA: Diagnosis not present

## 2018-11-13 DIAGNOSIS — O34219 Maternal care for unspecified type scar from previous cesarean delivery: Secondary | ICD-10-CM | POA: Diagnosis not present

## 2018-11-13 DIAGNOSIS — F419 Anxiety disorder, unspecified: Secondary | ICD-10-CM | POA: Diagnosis not present

## 2018-11-13 DIAGNOSIS — F411 Generalized anxiety disorder: Secondary | ICD-10-CM

## 2018-11-13 DIAGNOSIS — Z3A18 18 weeks gestation of pregnancy: Secondary | ICD-10-CM

## 2018-11-13 DIAGNOSIS — O219 Vomiting of pregnancy, unspecified: Secondary | ICD-10-CM | POA: Diagnosis not present

## 2018-11-13 HISTORY — DX: Anxiety disorder, unspecified: F41.9

## 2018-11-13 HISTORY — DX: Unspecified abnormal cytological findings in specimens from vagina: R87.629

## 2018-11-13 NOTE — Progress Notes (Signed)
MFM Consultation  Requesting provider: Dierdre Highman, DO Date of Service: 11/13/18 Reason for request: H/o of HELLP syndrome  Ms. Adair Patter is G2P1 at [place] 3 day dated by 7 week [place]. She is here in consultation regarding prior history of HELLP syndrome at the request of Dr. Nelda Marseille.  Overall the pregnancy has gone well with exception of persistent nausea and some vomiting. She is taking phenergan and diclegis daily for relief. She notes that this regimen helps her function well.  She had a low risk panorama per her records.   With regards of her prior history of HELLP syndrome she noted that she had persistent nausea. She also noted that she had significant epigastric pain that she thought was GERD, but later realized it was RUQ associated with HELLP.   Her blood pressure was elevated to 180's. Her labs also demonstrated abnormal LFT's and proteinuria. She was given magnesium, BMZ and initiated an IOL. She ultimately delivered via LTCS secondary arrest of dilation.   During the postpartum period she required 1 week of labetalol to control her blood pressure.   Her prenatal course this pregnancy is uneventful. She started taking low dose ASA when she began to pursue pregnancy.  Active Ambulatory Problems    Diagnosis Date Noted  . HELLP (hemolytic anemia/elev liver enzymes/low platelets in pregnancy) 11/18/2016  . History of poor pregnancy outcome 04/24/2017  . Generalized anxiety disorder 04/03/2018   Resolved Ambulatory Problems    Diagnosis Date Noted  . No Resolved Ambulatory Problems   Past Medical History:  Diagnosis Date  . Anxiety   . Severe preeclampsia, third trimester 11/18/2016  . Vaginal Pap smear, abnormal    Past Surgical History:  Procedure Laterality Date  . CESAREAN SECTION N/A 11/20/2016   Procedure: CESAREAN SECTION;  Surgeon: Janyth Pupa, DO;  Location: [place];  Service: Obstetrics;  Laterality: N/A;  . TONSILLECTOMY    . WISDOM TOOTH EXTRACTION      Social History   Socioeconomic History  . Marital status: Legally Separated    Spouse name: Not on file  . Number of children: Not on file  . Years of education: Not on file  . Highest education level: Not on file  Occupational History  . Not on file  Social Needs  . Financial resource strain: Not on file  . Food insecurity:    Worry: Not on file    Inability: Not on file  . Transportation needs:    Medical: Not on file    Non-medical: Not on file  Tobacco Use  . Smoking status: Never Smoker  . Smokeless tobacco: Never Used  Substance and Sexual Activity  . Alcohol use: No  . Drug use: No  . Sexual activity: Yes    Birth control/protection: None  Lifestyle  . Physical activity:    Days per week: Not on file    Minutes per session: Not on file  . Stress: Not on file  Relationships  . Social connections:    Talks on phone: Not on file    Gets together: Not on file    Attends religious service: Not on file    Active member of club or organization: Not on file    Attends meetings of clubs or organizations: Not on file    Relationship status: Not on file  . Intimate partner violence:    Fear of current or ex partner: Not on file    Emotionally abused: Not on file    Physically abused:  Not on file    Forced sexual activity: Not on file  Other Topics Concern  . Not on file  Social History Narrative  . Not on file   She is currently going through a divorce and this pregnancy is with a different partner.  She is a physician.  Current Outpatient Medications on File Prior to Visit  Medication Sig Dispense Refill  . Prenatal Vit-Fe Fumarate-FA (PRENATAL PO) Take 1 tablet by mouth daily.     No current facility-administered medications on file prior to visit.    Family History  Problem Relation Age of Onset  . Hypertension Mother   . Diabetes Father   . Hypertension Father   . Heart disease Maternal Grandmother   . Heart disease Maternal Grandfather   . Heart  attack Maternal Grandfather        81  . Diabetes Paternal Grandfather   . Heart disease Paternal Grandfather   . Stroke Paternal Grandfather      Impression/counseling:  I discussed with Ms Adair Patter the diagnosis and management of HELLP syndrome. She did not have abnormal platelets previous only proteinuria, abnormal LFT's and elevated Blood pressure.  I discussed that her risk of recurrence is up to 50% however, given that she is on low dose ASA and this is a new partner I suspect that her risk are lower.   I recommended at this time to: 1) Continue routine pregnatal care with vigilance is signs and symptoms of HELLP syndrome or preeclampsia. 2) Give that the fetal growth had slowed in the prior pregnancy we have scheduled a follow up growth at 28 and [redacted] weeks gestation. 3) she will continue daily low dose ASA 4) Baseline labs are normal  I spent 30 minutes with >50% in face to face communication with Ms. Adair Patter. She has no further questions at this time.

## 2018-11-13 NOTE — Progress Notes (Signed)
[facility] Counselor/Therapist Progress Note  Patient ID: Beverly Gonzalez, MRN: [id number],    Date: 10/29/2018  Time Spent: 65 minutes   1:00pm to 2:05pm  Treatment Type:  Individual therapy  Virtual Visit Note : I connected with patient by a video enabled telemedicine application or telephone, with their informed consent, and verified patient privacy and that I am speaking with the correct person using two identifiers. I am at [place] and patient is at home.    I discussed the limitations, risks, security and privacy concerns of performing psychotherapy and management service by telephone and the availability of in person appointments. I also discussed with the patient that there may be a patient responsible charge related to this service. The patient expressed understanding and agreed to proceed.  I discussed the treatment planning with the patient. The patient was provided an opportunity to ask questions and all were answered. The patient agreed with the plan and demonstrated an understanding of the instructions.   The patient was advised to call  our office if  symptoms worsen or feel they are in a crisis state and need immediate contact.  Reported Symptoms:  Anxiety, stress, pregnancy going ok so far and is being followed by a high-risk doctor, frustration, fatigue, anger  Mental Status Exam:  Appearance:     n/a   Behavior:  Sharing  Motor:  Normal  Speech/Language:   Normal Rate  Affect:  n/a  Mood:  anxious  Thought process:  normal  Thought content:    WNL  Sensory/Perceptual disturbances:    WNL  Orientation:  oriented to person, place, time/date, situation, day of week, month of year and year  Attention:  Good  Concentration:  Good  Memory:  WNL  Fund of knowledge:   Good  Insight:    Good  Judgment:   Good  Impulse Control:  Good   Risk Assessment: Danger to Self:  No Self-injurious Behavior: No Danger to Others: No Duty to  Warn:no Physical Aggression / Violence:No  Access to Firearms a concern: No  Gang Involvement:No   Subjective:   Patient much more anxious today and focused on separation/divorce/custody issues. Anxious, sad, angry, frustrated, tired, tearful. Very hard to let go of hurt and anger.  We discussed this at length especially how she can set better limits with separated husband and in other situations. Very emotional and states she has been "holding it in til we spoke".  Encouraged her find times when she has some privacy and be able to let herself cry when she feels the buildup of emotions rather than "choking back the tears."  Discussed ways of setting boundaries with separate husband and limiting her exposure to emails, texts, etc.  Not all info shared in this note due to patient privacy.  (Reviewed again with patient)... Communication between the two of them has often been very conflictual.  Worked again with patient on communication, including choosing her words and active listening. Also focused on setting appropriate limits, being able to set limits on reading numerous argumentative texts/emails from separated husband.  This is difficult and patient realizes it will take a lot of strength on her part and it is also in her best interest to be more calm and leveled out during her pregnancy at this point. Reports pregnancy is going well so far and is followed by "high risk doctor" due to issues with previous pregnancy.In regards to issues with separated husband, patient was encouraged her to  work on staying in the present and not looking back to past nor too far  ahead in the future. Also urged, especially in that relationship and trying to co-parent, to practice "looking for what may go right versus wrong."     Has some good support people on a personal and professional level.  Family supportive as well.  Importance placed on her and her own health right now,  As she continues to take good care of her  young son and co-parents him with his dad.  Talked specifically about ways to "let go" of hurtful actions of others so that she can move forward without the emotional weight of those actions. Due date for baby 04/13/2019.  Encouraged good self-care and continued goal-directed strategies.   Interventions: Cognitive Behavioral Therapy and Solution-Oriented/Positive Psychology  Diagnosis:   ICD-10-CM   1. Generalized anxiety disorder F41.1     Plan: Patient had virtual telehealth appt today to continue work on managing emotions she feels with marital separation and coparenting young toddler son.  To use strategies discussed in session to help her deal with current difficult times coupled with anxiety, and issues from the past as she wants to be able to let-go more of past and present, and be heathy moving into the future. Progress noted in goal review with patient.  Next appt approx 2 wks.  [name], LCSW

## 2018-11-27 ENCOUNTER — Other Ambulatory Visit: Payer: Self-pay

## 2018-11-27 ENCOUNTER — Ambulatory Visit (INDEPENDENT_AMBULATORY_CARE_PROVIDER_SITE_OTHER): Payer: 59 | Admitting: Psychiatry

## 2018-11-27 DIAGNOSIS — F411 Generalized anxiety disorder: Secondary | ICD-10-CM | POA: Diagnosis not present

## 2018-11-27 NOTE — Progress Notes (Signed)
[facility] Counselor/Therapist Progress Note  Patient ID: Beverly Gonzalez, MRN: [id number],    Date: 11/27/2018  Time Spent: 60 minutes   8:00am to 9:00am  Treatment Type:  Individual therapy  Virtual Visit Note : I connected with patient by a video enabled telemedicine application or telephone, with their informed consent, and verified patient privacy and that I am speaking with the correct person using two identifiers. I am at [place] and patient is at home.    I discussed the limitations, risks, security and privacy concerns of performing psychotherapy and management service by telephone and the availability of in person appointments. I also discussed with the patient that there may be a patient responsible charge related to this service. The patient expressed understanding and agreed to proceed.  I discussed the treatment planning with the patient. The patient was provided an opportunity to ask questions and all were answered. The patient agreed with the plan and demonstrated an understanding of the instructions.   The patient was advised to call  our office if  symptoms worsen or feel they are in a crisis state and need immediate contact.  Reported Symptoms:  Anxiety, stress, pregnancy going ok so far and is being followed by a high-risk doctor, frustration  Mental Status Exam:  Appearance:     n/a   Behavior:  Sharing  Motor:  Normal  Speech/Language:   Normal Rate  Affect:  n/a  Mood:  Anxious, frustrated  Thought process:  normal  Thought content:    WNL  Sensory/Perceptual disturbances:    WNL  Orientation:  oriented to person, place, time/date, situation, day of week, month of year and year  Attention:  Good  Concentration:  Good  Memory:  WNL  Fund of knowledge:   Good  Insight:    Good  Judgment:   Good  Impulse Control:  Good   Risk Assessment: Danger to Self:  No Self-injurious Behavior: No Danger to Others: No Duty to  Warn:no Physical Aggression / Violence:No  Access to Firearms a concern: No  Gang Involvement:No   Subjective:   Patient today reports symptoms noted above. From the beginning was emotional and said she has been waiting for [place] to be able to talk so she can vent her feelings freely.  Still frustrated on some issues with separated husband re: separation/divorce/custody/co-parenting.  Communication issues continue with separated husband and patient is working hard to let go of hurt and anger. Talked through concerns today about best helping their 85yr old son as he is in the middle of all that's happening between patient and separated husband. Good support by friend.   We reviewed  how she can set better limits with separated husband and communicate it in a way that he might connect more cooperatively. Encouraged her again to find times when she has some privacy and be able to let herself cry when she feels the buildup of emotions rather than "choking back the tears."  After crying, is feeling calmer and more at peace. Discussed ways of setting boundaries with separate husband and limiting her exposure to emails, texts, etc.  Not all info shared in this note due to patient privacy. Pregnancy reportedly going well at this point.  (Reviewed again with patient)... Communication between the two of them has often been very conflictual.  Worked again with patient on communication, including choosing her words and active listening. Also focused on setting appropriate limits, being able to set limits on reading  numerous argumentative texts/emails from separated husband.  This is difficult and patient realizes it will take a lot of strength on her part and it is also in her best interest to be more calm and leveled out during her pregnancy at this point. To practice looking for what may go right versus wrong. Has some good support people on a personal and professional level.  Family supportive as well.  Importance  placed on her and her own health right now,  As she continues to take good care of her young son and co-parents him with his dad.  Talked specifically about ways to "let go" of hurtful actions of others so that she can move forward without the emotional weight of those actions. Due date for baby 04/13/2019.  Encouraged good self-care and continued goal-directed strategies.    Interventions: Cognitive Behavioral Therapy and Solution-Oriented/Positive Psychology  Diagnosis:   ICD-10-CM   1. Generalized anxiety disorder  F41.1     Plan: Patient had virtual telehealth appt today to continue work on managing emotions she feels with marital separation and coparenting young toddler son.  To use strategies discussed in session to help her deal with current difficult times coupled with anxiety, and issues from the past as she wants to be able to let-go more of past and present, and be heathy moving into the future. Progress noted in goal review with patient.  Next appt approx 2 wks.  [name], LCSW

## 2018-12-11 ENCOUNTER — Ambulatory Visit (INDEPENDENT_AMBULATORY_CARE_PROVIDER_SITE_OTHER): Payer: 59 | Admitting: Psychiatry

## 2018-12-11 ENCOUNTER — Other Ambulatory Visit: Payer: Self-pay

## 2018-12-11 DIAGNOSIS — F411 Generalized anxiety disorder: Secondary | ICD-10-CM | POA: Diagnosis not present

## 2018-12-11 NOTE — Progress Notes (Signed)
[facility] Counselor/Therapist Progress Note  Patient ID: Beverly Gonzalez, MRN: [id number],    Date: 12/11/2018  Time Spent: 60 minutes   8:00am to 9:00am  Treatment Type:  Individual therapy  Reported Symptoms:   Anxiety, stress, pregnancy going ok so far and is being followed by a high-risk doctor, frustration  Mental Status Exam:  Appearance:   casual  Behavior:  Sharing  Motor:  Normal  Speech/Language:   Normal Rate  Affect:  anxious  Mood:  Anxious, frustrated  Thought process:  normal  Thought content:    WNL  Sensory/Perceptual disturbances:    WNL  Orientation:  oriented to person, place, time/date, situation, day of week, month of year and year  Attention:  Good  Concentration:  Good  Memory:  WNL  Fund of knowledge:   Good  Insight:    Good  Judgment:   Good  Impulse Control:  Good   Risk Assessment: Danger to Self:  No Self-injurious Behavior: No Danger to Others: No Duty to Warn:no Physical Aggression / Violence:No  Access to Firearms a concern: No  Gang Involvement:No   Subjective:   Patient anxious and concerned re: custody issues with young son and her separated husband.  Patient discussed in detail today her apprehension and concerns re: custody and impact on her son.  Very concerned, with the virus issues and young son potentially being exposed and getting sick if biological dad travels with him.  Per her attorney, is pursuing emergency custody order as current custody arrangements are expiring around July 4th weekend. Worked on her anxiety and excessive "worrying" and overthinking .  Focused on strategies to help with each of these and patient responded well.  Is also getting some helpful info from videos she has to watch for upcoming mediation.  Emphasized good over-all care especially emotionally as well as physical.  Work is going well, seeing patients in office on a couple days and virtually at other times.  Co-workers supportive.  Her parents and other family supportive.   Communication issues continue with separated husband and patient is working hard to let go of hurt and anger. Reviewed concerns today about best helping their 65yr old son as he is in the middle of all that's happening between patient and separated husband, and how she can set better limits with separated husband and communicate it in a way that he might connect more cooperatively. separate husband and limiting her exposure to emails, texts, etc. ( Not all info shared in this note due to patient privacy)  (Reviewed again with patient)... Communication between the two of them has often been very conflictual.  Worked again with patient on communication, including choosing her words and active listening. Also focused on setting appropriate limits, being able to set limits on reading numerous argumentative texts/emails from separated husband.  This is difficult and patient realizes it will take a lot of strength on her part and it is also in her best interest to be more calm and leveled out during her pregnancy at this point. To practice looking for what may go right versus wrong. Has some good support people on a personal and professional level.  Family supportive as well.  Importance placed on her and her own health right now,  As she continues to take good care of her young son and co-parents him with his dad.  Talked specifically about ways to "let go" of hurtful actions of others so that she can move forward  without the emotional weight of those actions. Due date for baby 04/13/2019.  Encouraged good self-care and continued goal-directed strategies which we reviewed before session end.   Interventions: Cognitive Behavioral Therapy and Solution-Oriented/Positive Psychology  Diagnosis:   ICD-10-CM   1. Generalized anxiety disorder  F41.1     Plan: Patient in office for appt today to continue work on managing emotions she feels with marital separation and  coparenting young toddler son.  To use strategies discussed in session to help her deal with current difficult times coupled with anxiety, and issues from the past as she wants to be able to let-go more of past and present, and be heathy moving into the future. Progress noted in goal review with patient.  Next appt approx 2 wks.  [name], LCSW

## 2018-12-25 ENCOUNTER — Ambulatory Visit (INDEPENDENT_AMBULATORY_CARE_PROVIDER_SITE_OTHER): Payer: 59 | Admitting: Psychiatry

## 2018-12-25 ENCOUNTER — Other Ambulatory Visit: Payer: Self-pay

## 2018-12-25 DIAGNOSIS — F411 Generalized anxiety disorder: Secondary | ICD-10-CM | POA: Diagnosis not present

## 2018-12-25 NOTE — Progress Notes (Signed)
[facility] Counselor/Therapist Progress Note  Patient ID: Beverly Gonzalez, MRN: [id number],    Date: 12/25/2018  Time Spent: 60 minutes   8:00am to 9:00am  Treatment Type:  Individual therapy  Reported Symptoms:   Anxiety, stress, pregnancy going ok so far and is being followed by a high-risk doctor, frustration  Mental Status Exam:  Appearance:   casual  Behavior:  Sharing  Motor:  Normal  Speech/Language:   Normal Rate  Affect:  anxious  Mood:  Anxious, frustrated  Thought process:  normal  Thought content:    WNL  Sensory/Perceptual disturbances:    WNL  Orientation:  oriented to person, place, time/date, situation, day of week, month of year and year  Attention:  Good  Concentration:  Good  Memory:  WNL  Fund of knowledge:   Good  Insight:    Good  Judgment:   Good  Impulse Control:  Good   Risk Assessment: Danger to Self:  No Self-injurious Behavior: No Danger to Others: No Duty to Warn:no Physical Aggression / Violence:No  Access to Firearms a concern: No  Gang Involvement:No   Subjective:   Patient in today stressed and anxious regarding some continuing custody issues of young son. Is to be in court this coming Tuesday re: followup on emergency custody order to limit him being taken out of [facility] right now due to coronavirus issues.  Husband resistant to that as he wants to be able to travel with their son and also wanting closer to 50% time.  Things with S.O/ going well and that seems to add some calmness in their relationship. Patient discussed more today in detail of her concerns and apprehension re: custody and impact on her son.  Very concerned, with the virus issues and young son potentially being exposed and getting sick if biological dad travels with him.  Worked on her anxiety and excessive "worrying" and overthinking, and yet understandable for her to be cautious due to young son's age and some prior experiences.  Focused on  strategies to help with each of these and patient responded well.   (Reviewed again with patient)... Communication between patient and separate husband has often been very conflictual.  Worked again with patient on communication, including choosing her words and active listening. Also focused on setting appropriate limits, being able to set limits on reading numerous argumentative texts/emails from separated husband.  This is difficult and it does take a lot of strength on her part and it is also in her best interest to be more calm and leveled out during her pregnancy at this point. Has some good support people on a personal and professional level.  Family supportive as well.  Importance placed on her and her own health right now,  As she continues to take good care of her young son and co-parents him with his dad.  Due date for baby 04/13/2019.  Encouraged good self-care and continued use of goal-directed strategies.   Interventions: Cognitive Behavioral Therapy and Solution-Oriented/Positive Psychology  Diagnosis:   ICD-10-CM   1. Generalized anxiety disorder  F41.1     Plan: Patient in office for appt today to continue work on managing emotions she feels with marital separation and coparenting young toddler son.  To use strategies discussed in session to help her deal with current difficult times coupled with anxiety, and issues from the past as she wants to be able to let-go more of past and present, and be heathy moving into  the future. Progress noted in goal review with patient.  Next appt approx 2 wks.  [name], LCSW

## 2019-01-08 ENCOUNTER — Other Ambulatory Visit: Payer: Self-pay

## 2019-01-08 ENCOUNTER — Ambulatory Visit (INDEPENDENT_AMBULATORY_CARE_PROVIDER_SITE_OTHER): Payer: 59 | Admitting: Psychiatry

## 2019-01-08 DIAGNOSIS — O0992 Supervision of high risk pregnancy, unspecified, second trimester: Secondary | ICD-10-CM | POA: Diagnosis not present

## 2019-01-08 DIAGNOSIS — O09892 Supervision of other high risk pregnancies, second trimester: Secondary | ICD-10-CM | POA: Diagnosis not present

## 2019-01-08 DIAGNOSIS — O09292 Supervision of pregnancy with other poor reproductive or obstetric history, second trimester: Secondary | ICD-10-CM | POA: Diagnosis not present

## 2019-01-08 DIAGNOSIS — F411 Generalized anxiety disorder: Secondary | ICD-10-CM | POA: Diagnosis not present

## 2019-01-08 NOTE — Progress Notes (Addendum)
[facility] Counselor/Therapist Progress Note  Patient ID: Beverly Gonzalez, MRN: [id number],    Date: 01/08/2019  Time Spent: 60 minutes   8:00am to 9:00am  Treatment Type:  Individual therapy  Reported Symptoms:   Anxiety, stress, pregnancy going ok so far and is being followed by a high-risk doctor  Mental Status Exam:  Appearance:   casual  Behavior:  Sharing  Motor:  Normal  Speech/Language:   Normal Rate  Affect:  anxious  Mood:  Anxious, frustrated  Thought process:  normal  Thought content:    WNL  Sensory/Perceptual disturbances:    WNL  Orientation:  oriented to person, place, time/date, situation, day of week, month of year and year  Attention:  Good  Concentration:  Good  Memory:  WNL  Fund of knowledge:   Good  Insight:    Good  Judgment:   Good  Impulse Control:  Good   Risk Assessment: Danger to Self:  No Self-injurious Behavior: No Danger to Others: No Duty to Warn:no Physical Aggression / Violence:No  Access to Firearms a concern: No  Gang Involvement:No   Subjective:   Patient in today stressed but less than most recent visit.  Shared how she is taking care of herself and is feeling good at this point in her pregnancy.  Physically feeling good.  Some occasional fears arise due to significant problems with first pregnancy but not getting fixated on the fears.  Legal issues are pending re: child custody of young son who still has some visits with bio father. The recently scheduled hearing got postponed til sometime likely in August.  Patient less anxious and less apprehensive today and reports things at home and work going well.  Improving some in looking for what may go right instead of wrong. Is still concerned about son's father being around more social situations where social distancing is not happening, and then father being with young son. She has expressed her concerns to the father.  Her parents remain in contact and are supportive.  She remains concerned with the coronavirus and trying to be very protective of her son and herself/unborn baby. Anxiety has lessened some.   (Reviewed again with patient)... Communication between patient and separate husband has often been very conflictual.  Worked again with patient on communication, including choosing her words and active listening. Also focused on setting appropriate limits, being able to set limits on reading numerous argumentative texts/emails from separated husband.  This is difficult and it does take a lot of strength on her part and it is also in her best interest to be more calm and leveled out during her pregnancy at this point. Has some good support people on a personal and professional level.  Family supportive as well.  Importance placed on her and her own health right now,  As she continues to take good care of her young son and co-parents him with his dad.  Due date for baby 04/13/2019.  Encouraged good self-care and continued use of goal-directed strategies.   Interventions: Cognitive Behavioral Therapy and Solution-Oriented/Positive Psychology  Diagnosis:   ICD-10-CM   1. Generalized anxiety disorder  F41.1     Plan: Patient in office for appt today to continue work on managing emotions she feels with marital separation and coparenting young toddler son.  To use strategies discussed in session to help her deal with current difficult times coupled with anxiety, and issues from the past as she wants to be able to  let-go more of past and present, and be heathy moving into the future. Progress noted in goal review with patient.  Next appt approx 2 wks.  [name], LCSW

## 2019-01-19 ENCOUNTER — Encounter (HOSPITAL_COMMUNITY): Payer: Self-pay

## 2019-01-21 DIAGNOSIS — O350XX0 Maternal care for (suspected) central nervous system malformation in fetus, not applicable or unspecified: Secondary | ICD-10-CM | POA: Diagnosis not present

## 2019-01-21 DIAGNOSIS — O26843 Uterine size-date discrepancy, third trimester: Secondary | ICD-10-CM | POA: Diagnosis not present

## 2019-01-21 DIAGNOSIS — Z3A28 28 weeks gestation of pregnancy: Secondary | ICD-10-CM | POA: Diagnosis not present

## 2019-01-21 DIAGNOSIS — O09299 Supervision of pregnancy with other poor reproductive or obstetric history, unspecified trimester: Secondary | ICD-10-CM | POA: Diagnosis not present

## 2019-01-22 ENCOUNTER — Ambulatory Visit (HOSPITAL_COMMUNITY): Payer: 59

## 2019-01-22 ENCOUNTER — Ambulatory Visit (INDEPENDENT_AMBULATORY_CARE_PROVIDER_SITE_OTHER): Payer: 59 | Admitting: Psychiatry

## 2019-01-22 ENCOUNTER — Encounter (HOSPITAL_COMMUNITY): Payer: Self-pay

## 2019-01-22 ENCOUNTER — Other Ambulatory Visit: Payer: Self-pay

## 2019-01-22 ENCOUNTER — Ambulatory Visit (HOSPITAL_COMMUNITY)
Admission: RE | Admit: 2019-01-22 | Discharge: 2019-01-22 | Disposition: A | Discharge status: Home / Self Care | Payer: 59 | Source: Ambulatory Visit | Attending: Obstetrics and Gynecology | Admitting: Obstetrics and Gynecology

## 2019-01-22 DIAGNOSIS — F411 Generalized anxiety disorder: Secondary | ICD-10-CM

## 2019-01-22 DIAGNOSIS — Z3483 Encounter for supervision of other normal pregnancy, third trimester: Secondary | ICD-10-CM | POA: Diagnosis not present

## 2019-01-22 DIAGNOSIS — Z23 Encounter for immunization: Secondary | ICD-10-CM | POA: Diagnosis not present

## 2019-01-22 NOTE — Progress Notes (Signed)
[facility] Counselor/Therapist Progress Note  Patient ID: Beverly Gonzalez, MRN: [id number],    Date: 01/22/2019  Time Spent: 60 minutes   10:00am to 11:00am  Treatment Type:  Individual therapy  Reported Symptoms:   Anxiety, stress, pregnancy going ok so far and is being followed by a high-risk doctor  Mental Status Exam:  Appearance:   casual  Behavior:  Sharing  Motor:  Normal  Speech/Language:   Normal Rate  Affect:  anxious  Mood:  Anxious, frustrated  Thought process:  normal  Thought content:    WNL  Sensory/Perceptual disturbances:    WNL  Orientation:  oriented to person, place, time/date, situation, day of week, month of year and year  Attention:  Good  Concentration:  Good  Memory:  WNL  Fund of knowledge:   Good  Insight:    Good  Judgment:   Good  Impulse Control:  Good   Risk Assessment: Danger to Self:  No Self-injurious Behavior: No Danger to Others: No Duty to Warn:no Physical Aggression / Violence:No  Access to Firearms a concern: No  Gang Involvement:No   Subjective:   Patient in today very stressed, mostly in reference to some continuing concerns with separated husband.  Concerns involve decision re: their young son, especially exposure during this time of COVID-19. Validated patient's feelings and she felt heard.  Processed her anxieties and concerns in detail.  She is in touch with her attorney and feels confident in that relationship.  Talked about what is in her control and what is not in her control, and how to better handle the things she can and being able to let go of what she can't control. Was quite anxious initially and became more calm as she vented her concerns and talked them through at length.  Reviewed coping skills and strategies for managing her concern with some lessened anxiety, and also in dealing with uncertainties in ways that she can remain more grounded.  Encouraged ongoing self-care including emotional and  physical care.  Has some activities planned this weekend with her son at their home and is looking forward to the time with him.    Shared that she went to her doctor yesterday and had ultrasound that reflects her unborn child is looking healthy and patient is doing well physically also. Shared how she is taking care of herself and is still feeling good at this point in her pregnancy.   Some occasional fears arise due to significant problems with first pregnancy but not getting fixated on the fears and has been able to "stay in the present."  Legal issues are pending re: child custody of young son who still has some visits with bio father. The recently scheduled hearing got postponed til February 09, 2019.   Is still concerned about young son's father being around more social situations where social distancing is not happening, and then father being with young son. She has expressed her concerns to the father.  Her parents remain in contact and are supportive. She remains concerned with the coronavirus and trying to be very protective of her son and herself/unborn baby.    (Reviewed again with patient)... Communication between patient and separate husband has often been very conflictual.  Worked again with patient on communication, including choosing her words and active listening. Also focused on setting appropriate limits, being able to set limits on reading numerous argumentative texts/emails from separated husband.  This is difficult and it does take a lot  of strength on her part and it is also in her best interest to be more calm and leveled out during her pregnancy at this point. Has some good support people on a personal and professional level.  Family supportive as well.  Importance placed on her and her own health right now, as  she continues to take good care of her young son and co-parents him with his dad.  Due date for baby 04/13/2019.   Interventions: Cognitive Behavioral Therapy and  Solution-Oriented/Positive Psychology  Diagnosis:   ICD-10-CM   1. Generalized anxiety disorder  F41.1     Plan: Patient in office for appt today to continue work on managing emotions she feels with marital separation and coparenting young toddler son.  To use strategies discussed in session to help her deal with current difficult times coupled with anxiety, often re: issues with separated husband (young son's dad) and decisions made that involve the care of their son. Progress noted in goal review with patient.  Next appt approx 2 wks.  [name], LCSW

## 2019-02-05 ENCOUNTER — Other Ambulatory Visit: Payer: Self-pay

## 2019-02-05 ENCOUNTER — Ambulatory Visit (INDEPENDENT_AMBULATORY_CARE_PROVIDER_SITE_OTHER): Payer: 59 | Admitting: Psychiatry

## 2019-02-05 DIAGNOSIS — F411 Generalized anxiety disorder: Secondary | ICD-10-CM | POA: Diagnosis not present

## 2019-02-05 NOTE — Progress Notes (Signed)
[facility] Counselor/Therapist Progress Note  Patient ID: Beverly Gonzalez, MRN: [id number],    Date: 02/05/2019  Time Spent: 60 minutes   9:00am to 10:00am  Treatment Type:  Individual therapy  Reported Symptoms:   Anxiety, stress, pregnancy going ok so far and is being followed by a high-risk doctor  Mental Status Exam:  Appearance:   casual  Behavior:  Sharing  Motor:  Normal  Speech/Language:   Normal Rate  Affect:  anxious  Mood:  Anxious, frustrated  Thought process:  normal  Thought content:    WNL  Sensory/Perceptual disturbances:    WNL  Orientation:  oriented to person, place, time/date, situation, day of week, month of year and year  Attention:  Good  Concentration:  Good  Memory:  WNL  Fund of knowledge:   Good  Insight:    Good  Judgment:   Good  Impulse Control:  Good   Risk Assessment: Danger to Self:  No Self-injurious Behavior: No Danger to Others: No Duty to Warn:no Physical Aggression / Violence:No  Access to Firearms a concern: No  Gang Involvement:No   Subjective:   Patient in today noticing she is more emotionally labile.  Very stressed mostly in reference to some continuing concerns with separated husband and child custody issues continuing.  Young son burned his hand on stove while staying with dad recently and patient upset as she was not notified by dad, but discovered by viewing a burn on his hand when he was returned home.   * Is filing for divorce this coming Monday, Aug. 24, 2020.    Physically more tired and uncomfortable at times as she gets further into her pregnancy.  Realizes her emotions are more on edge and worked on emotional regulation in session today, and not reacting in the moment. To focus more on what she CAN control versus what she cannot control.  Reviewed stress management and effective communication techniques when under stress.    Reviewed coping skills and strategies for managing her concern with some  lessened anxiety, and also in dealing with uncertainties in ways that she can remain more grounded.  Encouraged ongoing self-care including emotional and physical care.    Some occasional fears still arise due to significant problems with first pregnancy but working to not getting fixated on the fears and "stay in the present."  Legal issues are pending re: child custody of young son who still has some visits with bio father.  Possible court date this coming week for custody related issues, and if not , it could be in October. She remains concerned with the coronavirus and trying to be very protective of her son and herself/unborn baby and follows all precautions.  (Reviewed again with patient)... Communication between patient and separate husband has often been very conflictual.  Worked again with patient on communication, including choosing her words, modulating tone of voice, and active listening. Also focused on setting appropriate limits, being able to set limits on reading numerous argumentative texts/emails from separated husband.  This is difficult and it does take a lot of strength on her part and it is also in her best interest to be more calm and leveled out during her pregnancy at this point. Has some good support people on a personal and professional level.  Family supportive as well.  Importance placed on her and her own health right now, as  she continues to take good care of her young son and co-parents him with  his dad.  Due date for baby 04/13/2019.   Interventions: Cognitive Behavioral Therapy and Solution-Oriented/Positive Psychology  Diagnosis:   ICD-10-CM   1. Generalized anxiety disorder  F41.1     Plan: Patient in office for appt today to continue work on managing emotions she feels with marital separation and coparenting young toddler son.  To use strategies discussed in session to help her deal with current difficult times coupled with anxiety, often re: issues with separated  husband (young son's dad) and decisions made that involve the care of their son. Progress noted in goal review with patient.  Next appt approx 2 wks.  [name], LCSW

## 2019-02-09 DIAGNOSIS — Z3A31 31 weeks gestation of pregnancy: Secondary | ICD-10-CM | POA: Diagnosis not present

## 2019-02-09 DIAGNOSIS — O09293 Supervision of pregnancy with other poor reproductive or obstetric history, third trimester: Secondary | ICD-10-CM | POA: Diagnosis not present

## 2019-02-19 ENCOUNTER — Other Ambulatory Visit: Payer: Self-pay

## 2019-02-19 ENCOUNTER — Ambulatory Visit (INDEPENDENT_AMBULATORY_CARE_PROVIDER_SITE_OTHER): Payer: 59 | Admitting: Psychiatry

## 2019-02-19 DIAGNOSIS — F411 Generalized anxiety disorder: Secondary | ICD-10-CM

## 2019-02-19 NOTE — Progress Notes (Addendum)
      [facility] Counselor/Therapist Progress Note  Patient ID: Beverly Gonzalez, MRN: [id number],    Date: 02/19/2019  Time Spent: 60 minutes    9:00am to 10:00am  Treatment Type:  Individual therapy  Reported Symptoms:  Stress, anxiety, worries re: current pregnancy due to trauma with birth of first child  Mental Status Exam:  Appearance:   Neatly dressed  Behavior:  Sharing  Motor:  Normal  Speech/Language:   Normal Rate  Affect:  anxious  Mood:  Anxious,   Thought process:  normal  Thought content:    WNL  Sensory/Perceptual disturbances:    WNL  Orientation:  oriented to person, place, time/date, situation, day of week, month of year and year  Attention:  Good  Concentration:  Good  Memory:  WNL  Fund of knowledge:   Good  Insight:    Good  Judgment:   Good  Impulse Control:  Good   Risk Assessment: Danger to Self:  No Self-injurious Behavior: No Danger to Others: No Duty to Warn:no Physical Aggression / Violence:No  Access to Firearms a concern: No  Gang Involvement:No   Subjective:  Patient in today and is stressed, but reports managing it better at times. C *Is taking her Zoloft 25mg  daily and is calmer, sleeping some better, and not as emotionally labile. Did follow through in filing for divorce. Things going well with S.O. and current pregnancy going well.  Did discuss negative impact of worrying and jumping ahead "wondering what may go wrong".  Think the Zoloft may help that some also, as past few days/nights have been some better. Still some friction with separated husband and court date is getting pushed back to November most likely. Still working at this point and feeling ok, but understandably tires more easily. Describes better emotional regulation more recently. Family is out of state and they are supportive.  Patient is in process of securing a "nanny" for 39 yr old son to begin hopefully later this month or early October.  Emphasized priority  of her own health right now, as she continues to take good care of her young 69 yr old son and co-parents him with his dad.  Approximate due date for baby "Ovid Curd" is 04/13/2019.    Interventions: Cognitive Behavioral Therapy and Solution-Oriented/Positive Psychology  Diagnosis:   ICD-10-CM   1. Generalized anxiety disorder  F41.1      Plan: Patient not signing tx plan on computer screen due to COVID-19.  Treatment Goals:  Long Term Goal: Enhance ability to handle effectively the full variety of life's anxieties. (Progressing)- Patient motivated. Will report progress or lack of progress in sessions.  Short Term Goal: 1.Increase understanding of beliefs and messages that produce anxiety and worry.  Strategy: Explore cognitive messages that mediate anxiety response and retrain in adaptive cognitions. Remain on her Zoloft unless advised differently by her OB-GYN.  Will report progress or lack of progress in sessions. (Progressing)- Patient motivated.     Next appt approx 2 wks.  [name], LCSW

## 2019-02-20 ENCOUNTER — Other Ambulatory Visit: Payer: Self-pay

## 2019-02-20 ENCOUNTER — Ambulatory Visit (INDEPENDENT_AMBULATORY_CARE_PROVIDER_SITE_OTHER): Payer: 59

## 2019-02-20 DIAGNOSIS — Z23 Encounter for immunization: Secondary | ICD-10-CM | POA: Diagnosis not present

## 2019-03-05 ENCOUNTER — Ambulatory Visit (INDEPENDENT_AMBULATORY_CARE_PROVIDER_SITE_OTHER): Payer: 59 | Admitting: Psychiatry

## 2019-03-05 ENCOUNTER — Other Ambulatory Visit: Payer: Self-pay

## 2019-03-05 DIAGNOSIS — F411 Generalized anxiety disorder: Secondary | ICD-10-CM | POA: Diagnosis not present

## 2019-03-05 NOTE — Progress Notes (Signed)
      [facility] Counselor/Therapist Progress Note  Patient ID: Beverly Gonzalez, MRN: [id number],    Date: 03/05/2019  Time Spent:  60 minutes    9:00am to 10:00am  Treatment Type: Individual Therapy  Reported Symptoms:  anxious, some frustration in communicating with father of their young son  Mental Status Exam:  Appearance:   Casual     Behavior:  Appropriate and Sharing  Motor:  Normal  Speech/Language:   Clear and Coherent  Affect:  anxious  Mood:  anxious  Thought process:  normal  Thought content:    WNL  Sensory/Perceptual disturbances:    WNL  Orientation:  oriented to person, place, time/date, situation, day of week, month of year and year  Attention:  Good  Concentration:  Good  Memory:  WNL  Fund of knowledge:   Good  Insight:    Good  Judgment:   Good  Impulse Control:  Good   Risk Assessment: Danger to Self:  No Self-injurious Behavior: No Danger to Others: No Duty to Warn:no Physical Aggression / Violence:No  Access to Firearms a concern: No  Gang Involvement:No   Subjective:   Anxious, frustrated with father of young son seems to not be  understanding the COVID.  Is hoping to work til Oct. 15th.  Has hired a Surveyor, [employer] to help out with young son when baby is born.   Interventions: Cognitive Behavioral Therapy and Solution-Oriented/Positive Psychology  Diagnosis:   ICD-10-CM   1. Generalized anxiety disorder  F41.1     Plan:  Treatment goals may remain on tx plan as patient works on strategies, and progress will be documented each session in the "Progress" sections..  Treatment Goals: Patient not signing tx plan on computer screen due to COVID-19.  Long Term Goal: Enhance ability to handle effectively the full variety of life's anxieties. (patient is pregnant and had critical experience with previous pregnancy so managing her anxiety right now is a priority)  Short Term Goal: .Increase understanding of beliefs and messages that  produce anxiety and worry.  Strategy: Explore cognitive messages that mediate anxiety response and retrain in adaptive cognitions. Remain on her Zoloft unless advised differently by her OB-GYN.  Will report progress or lack of progress in sessions.  (Progressing)-  Patient is motivated and reports today her work on trying to be mindful of triggers to her anxiety and interrupt them in the moment.  Working also to explore and implement cognitive messages that are more adaptive and would support calming behaviors and positive self-talk.  Goal review and progress noted with patient.   Next session within 2 weeks.   Beverly Ace, LCSW

## 2019-03-08 DIAGNOSIS — Z3483 Encounter for supervision of other normal pregnancy, third trimester: Secondary | ICD-10-CM | POA: Diagnosis not present

## 2019-03-08 DIAGNOSIS — Z3490 Encounter for supervision of normal pregnancy, unspecified, unspecified trimester: Secondary | ICD-10-CM | POA: Diagnosis not present

## 2019-03-10 DIAGNOSIS — O26843 Uterine size-date discrepancy, third trimester: Secondary | ICD-10-CM | POA: Diagnosis not present

## 2019-03-10 DIAGNOSIS — Z3483 Encounter for supervision of other normal pregnancy, third trimester: Secondary | ICD-10-CM | POA: Diagnosis not present

## 2019-03-10 DIAGNOSIS — O09299 Supervision of pregnancy with other poor reproductive or obstetric history, unspecified trimester: Secondary | ICD-10-CM | POA: Diagnosis not present

## 2019-03-10 DIAGNOSIS — Z98891 History of uterine scar from previous surgery: Secondary | ICD-10-CM | POA: Diagnosis not present

## 2019-03-17 DIAGNOSIS — O0993 Supervision of high risk pregnancy, unspecified, third trimester: Secondary | ICD-10-CM | POA: Diagnosis not present

## 2019-03-19 ENCOUNTER — Other Ambulatory Visit: Payer: Self-pay

## 2019-03-19 ENCOUNTER — Ambulatory Visit (INDEPENDENT_AMBULATORY_CARE_PROVIDER_SITE_OTHER): Payer: 59 | Admitting: Psychiatry

## 2019-03-19 DIAGNOSIS — F411 Generalized anxiety disorder: Secondary | ICD-10-CM

## 2019-03-19 NOTE — Progress Notes (Signed)
      [facility] Counselor/Therapist Progress Note  Patient ID: Beverly Gonzalez, MRN: [id number],    Date: 03/19/2019  Time Spent: 60 minutes    9:00am to 10:00am  Treatment Type: Individual Therapy  Reported Symptoms: anxiety, frustration,   Mental Status Exam:  Appearance:   Neat     Behavior:  Appropriate, sharing, calmer  Motor:  Normal  Speech/Language:   Normal Rate  Affect:  Depressed  Mood:  anxious and depressed  Thought process:  goal directed  Thought content:    WNL  Sensory/Perceptual disturbances:    WNL  Orientation:  oriented to person, place, time/date, situation, day of week, month of year and year  Attention:  Good  Concentration:  Good  Memory:  Good  Fund of knowledge:   Good  Insight:    Good  Judgment:   Good  Impulse Control:  Good   Risk Assessment: Danger to Self:  No Self-injurious Behavior: No Danger to Others: No Duty to Warn:no Physical Aggression / Violence:No  Access to Firearms a concern: No  Gang Involvement:No   Subjective:   Patient in today anxious and frustrated with some ongoing communication issues with father of child.  Venting freely and then shifted back to focus on her self-care as she is so close to her induction date with baby (Oct. 22nd).  Court date for permanent custody agreement and child support on Oct. 12th.  Stress and frustration and trying to limit it's impact especially with her current pregnancy.  Interventions: Cognitive Behavioral Therapy and Ego-Supportive  Diagnosis:   ICD-10-CM   1. Generalized anxiety disorder  F41.1     Plan:  Treatment goals may remain on tx plan as patient works on strategies, and progress will be documented each session in the "Progress" sections..  Treatment Goals: Patient not signing tx plan on computer screen due to COVID-19.  Long Term Goal: Enhance ability to handle effectively the full variety of life's anxieties. (patient is pregnant and had critical  experience with previous pregnancy so managing her anxiety right now is a priority)  Short Term Goal: .Increase understanding of beliefs and messages that produce anxiety and worry.  Strategy: Explore cognitive messages that mediate anxiety response and retrain in adaptive cognitions. Remain on her Zoloft unless advised differently by her OB-GYN. Will report progress or lack of progress in sessions.  PROGRESS: Patient is doing noticeably better with stress and anxiety management today.  It's up and down sometimes but overall she reports it is definitely some better and she is steadily working on it. Is feeling more optimism in some ways.  Looked at some of her anxiety-provoking messages and how she has been trying to eliminate them and re-focus on more calm and rational thoughts and beliefs, making her unborn child, her young son, and herself a priority right now.  Unfortunately there is the child custody court date coming up as noted above. She has to really focus on managing the anxiety and work at it, and she is having some success which feels good to her. Did give 1 example of how she's been able to use positive self-talk this past week and how different that feels.   Goal review and progress noted with patient.   [name], LCSW

## 2019-03-30 ENCOUNTER — Encounter (HOSPITAL_COMMUNITY): Payer: Self-pay | Admitting: *Deleted

## 2019-03-30 ENCOUNTER — Telehealth (HOSPITAL_COMMUNITY): Payer: Self-pay | Admitting: *Deleted

## 2019-03-30 LAB — OB RESULTS CONSOLE GBS: GBS: POSITIVE

## 2019-03-30 NOTE — Telephone Encounter (Signed)
Preadmission screen  

## 2019-04-05 ENCOUNTER — Inpatient Hospital Stay (HOSPITAL_COMMUNITY)
Admission: AD | Admit: 2019-04-05 | Discharge: 2019-04-05 | Disposition: A | Discharge status: Home / Self Care | Payer: 59 | Source: Home / Self Care | Attending: Obstetrics & Gynecology | Admitting: Obstetrics & Gynecology

## 2019-04-05 ENCOUNTER — Other Ambulatory Visit: Payer: Self-pay

## 2019-04-05 ENCOUNTER — Inpatient Hospital Stay (HOSPITAL_BASED_OUTPATIENT_CLINIC_OR_DEPARTMENT_OTHER): Payer: 59

## 2019-04-05 DIAGNOSIS — O26843 Uterine size-date discrepancy, third trimester: Secondary | ICD-10-CM | POA: Insufficient documentation

## 2019-04-05 DIAGNOSIS — O09293 Supervision of pregnancy with other poor reproductive or obstetric history, third trimester: Secondary | ICD-10-CM | POA: Diagnosis not present

## 2019-04-05 DIAGNOSIS — Z3A38 38 weeks gestation of pregnancy: Secondary | ICD-10-CM | POA: Insufficient documentation

## 2019-04-05 DIAGNOSIS — O34219 Maternal care for unspecified type scar from previous cesarean delivery: Secondary | ICD-10-CM | POA: Insufficient documentation

## 2019-04-05 DIAGNOSIS — F419 Anxiety disorder, unspecified: Secondary | ICD-10-CM | POA: Diagnosis not present

## 2019-04-05 DIAGNOSIS — Z3A39 39 weeks gestation of pregnancy: Secondary | ICD-10-CM | POA: Diagnosis not present

## 2019-04-05 DIAGNOSIS — Z20828 Contact with and (suspected) exposure to other viral communicable diseases: Secondary | ICD-10-CM | POA: Diagnosis not present

## 2019-04-05 DIAGNOSIS — O99344 Other mental disorders complicating childbirth: Secondary | ICD-10-CM | POA: Diagnosis not present

## 2019-04-05 DIAGNOSIS — O99824 Streptococcus B carrier state complicating childbirth: Secondary | ICD-10-CM | POA: Diagnosis not present

## 2019-04-05 DIAGNOSIS — N898 Other specified noninflammatory disorders of vagina: Secondary | ICD-10-CM | POA: Diagnosis not present

## 2019-04-05 DIAGNOSIS — O26893 Other specified pregnancy related conditions, third trimester: Secondary | ICD-10-CM | POA: Diagnosis not present

## 2019-04-05 DIAGNOSIS — O365930 Maternal care for other known or suspected poor fetal growth, third trimester, not applicable or unspecified: Secondary | ICD-10-CM | POA: Insufficient documentation

## 2019-04-05 LAB — SARS CORONAVIRUS 2 BY RT PCR (HOSPITAL ORDER, PERFORMED IN ~~LOC~~ HOSPITAL LAB): SARS Coronavirus 2: NEGATIVE

## 2019-04-05 NOTE — Progress Notes (Signed)
Patient sent from office for limited u/s to check AFI. Goes to [facility]. Fundal height small for dates so Dr. Nelda Marseille wanted AFI checked.  Pt with no complaints. Afi normal Reviewed with Dr. Nelda Marseille. Requesting to have patient discharged. Pt already scheduled for IOL on Thursday.   Jorje Guild, NP

## 2019-04-05 NOTE — MAU Note (Signed)
Pt for induction Thu night.  Is to return tomorrow for pre-procedural swabbing.  Called L&D charge to see if we can swab today to save her a trip.  ok'd for early testing.

## 2019-04-05 NOTE — MAU Note (Signed)
Asymptomatic, swab collected. 

## 2019-04-05 NOTE — MAU Note (Signed)
[facility] called, sent pt in for AFI.   Measuring small.  Office and MFM swamped, until to get her in.

## 2019-04-05 NOTE — MAU Note (Signed)
[place] completed, waiting on results.Some contractions, nothing regular. Denies bleeding or leaking.

## 2019-04-06 ENCOUNTER — Other Ambulatory Visit (HOSPITAL_COMMUNITY): Admission: RE | Admit: 2019-04-06 | Payer: 59 | Source: Ambulatory Visit

## 2019-04-06 ENCOUNTER — Encounter (HOSPITAL_COMMUNITY): Payer: Self-pay | Admitting: *Deleted

## 2019-04-06 ENCOUNTER — Inpatient Hospital Stay (EMERGENCY_DEPARTMENT_HOSPITAL)
Admission: AD | Admit: 2019-04-06 | Discharge: 2019-04-06 | Disposition: A | Discharge status: Home / Self Care | Payer: 59 | Source: Home / Self Care | Attending: Obstetrics and Gynecology | Admitting: Obstetrics and Gynecology

## 2019-04-06 DIAGNOSIS — O26893 Other specified pregnancy related conditions, third trimester: Secondary | ICD-10-CM | POA: Insufficient documentation

## 2019-04-06 DIAGNOSIS — Z3A39 39 weeks gestation of pregnancy: Secondary | ICD-10-CM

## 2019-04-06 DIAGNOSIS — N898 Other specified noninflammatory disorders of vagina: Secondary | ICD-10-CM

## 2019-04-06 LAB — POCT FERN TEST: POCT Fern Test: NEGATIVE

## 2019-04-06 NOTE — H&P (Signed)
HPI: 33 y/o G2P0101 @ [redacted]w[redacted]d estimated gestational age (as dated by LMP c/w 20 week ultrasound) presents for elective IOL.   no Leaking of Fluid,   no Vaginal Bleeding,   irregular Uterine Contractions,  + Fetal Movement.  Prenatal care has been provided by Dr. Nelda Marseille  ROS: no HA, no epigastric pain, no visual changes.    Pregnancy complicated by: 1) prior C-section- arrest of dilation, desires TOLAC.  Risk/benefit and alternatives reviewed with patient including 1% of rupture- desires to proceed 2) h/o HELLP- s/p ASA until 36wks, baseline labs within normal limits, asymptomatic Last [place] @ [redacted]w[redacted]d- vertex/post/EFW 5#13oz (61%) 3) Anxiety- on zoloft 50mg  daily 4) GBS positive  Prenatal Transfer Tool  Maternal Diabetes: No Genetic Screening: Normal- low risk FEMALE Maternal Ultrasounds/Referrals: Normal Fetal Ultrasounds or other Referrals:  None Maternal Substance Abuse:  No Significant Maternal Medications:  Meds include: Zoloft Significant Maternal Lab Results: Group B Strep positive   PNL:  GBS positive, Rub Immune, Hep B neg, RPR NR, HIV neg, GC/C neg, glucola:107 Hgb: 12.8 Blood type: O positive  Immunizations: Tdap: 01/22/2019 Flu: through [facility]  OBHx: 2018- primary C-section, arrest of dilation- complicated by HELLP PMHx:  anxiety Meds:  PNV, zoloft Allergy:  No Known Allergies SurgHx: C-section x 1 SocHx:   no Tobacco, no  EtOH, no Illicit Drugs  O: to be obtained upon arrival Gen. AAOx3, NAD CV.  RRR  No murmur.  Resp. CTAB, no wheeze or crackles. Abd. Gravid,  no tenderness,  no rigidity,  no guarding Extr.  no edema B/L , no calf tenderness, neg Homan's B/L  FHT: 140 bpm [place]: 10/19: vertex, AFI 20  Labs: see orders  A/P:  33 y.o. G2P0101 @ [redacted]w[redacted]d EGA who presents for elective IOL -FWB:  NICHD Cat I FHTs -Labor: start induction with Pitocin and plan for Foley balloon -GBS: positive- PCN per protocol -Pain management: IV or epidural upon request -Anxiety: continue  zoloft 50mg  daily  Janyth Pupa, DO [phone] (cell) [phone] (office)

## 2019-04-06 NOTE — MAU Note (Signed)
.   Beverly Gonzalez is a 33 y.o. at [redacted]w[redacted]d here in MAU reporting: LOF that started an hour ago  Onset of complaint: 0700 Pain score: 0 Vitals:   04/06/19 0757  BP: 110/69  Pulse: (!) 101  Resp: 16  Temp: 98 F (36.7 C)  SpO2: 100%     FHT 145 Lab orders placed from triage: UA

## 2019-04-06 NOTE — MAU Provider Note (Signed)
S: Ms. Beverly Gonzalez is a 33 y.o. G2P0101 at [redacted]w[redacted]d  who presents to MAU today complaining of leaking of fluid since 0730 this morning. She denies vaginal bleeding. She endorses contractions that have been ongoing for the past two weeks. Yesterday she was a a "tight fingertip" in the office. She reports normal fetal movement.    O: BP 110/69   Pulse 88   Temp 98 F (36.7 C) (Oral)   Resp 16   Ht 5\' 10"  (1.778 m)   Wt 80.7 kg   SpO2 100%   BMI 25.54 kg/m  GENERAL: Well-developed, well-nourished female in no acute distress.  HEAD: Normocephalic, atraumatic.  CHEST: Normal effort of breathing, regular heart rate ABDOMEN: Soft, nontender, gravid PELVIC: Normal external female genitalia. Vagina is pink and rugated. Cervix with normal contour, no lesions. Normal discharge.  No pooling. Copious amount of mucous extruding from cervical os but once it was removed with swab, no fluid was observed extruding from the vagina.   Cervical exam: FT, long, anterior     Fetal Monitoring: Baseline: 140 Variability: mod Accelerations: present Decelerations: none Contractions: irregular  Results for orders placed or performed during the hospital encounter of 04/06/19 (from the past 24 hour(s))  Fern Test     Status: Normal   Collection Time: 04/06/19  8:29 AM  Result Value Ref Range   POCT Fern Test Negative = intact amniotic membranes      A: [facility] at [redacted]w[redacted]d  Membranes intact  P: Report given to RN to contact MD on call for further instructions RN spoke to Dr. Nelda Marseille (patient's OB) who agrees with plan to discharge patient.   Starr Lake, [place] 04/06/2019 8:37 AM

## 2019-04-06 NOTE — Progress Notes (Signed)
Dr Nelda Marseille notified of negative FERN

## 2019-04-08 ENCOUNTER — Inpatient Hospital Stay (HOSPITAL_COMMUNITY)
Admission: RE | Admit: 2019-04-08 | Discharge: 2019-04-10 | DRG: 807 | Disposition: A | Discharge status: Home / Self Care | Payer: 59 | Attending: Obstetrics & Gynecology | Admitting: Obstetrics & Gynecology

## 2019-04-08 ENCOUNTER — Encounter (HOSPITAL_COMMUNITY): Payer: Self-pay

## 2019-04-08 ENCOUNTER — Other Ambulatory Visit: Payer: Self-pay

## 2019-04-08 ENCOUNTER — Inpatient Hospital Stay (HOSPITAL_COMMUNITY): Payer: 59 | Admitting: Anesthesiology

## 2019-04-08 ENCOUNTER — Inpatient Hospital Stay (HOSPITAL_COMMUNITY): Payer: 59

## 2019-04-08 DIAGNOSIS — Z3A00 Weeks of gestation of pregnancy not specified: Secondary | ICD-10-CM | POA: Diagnosis not present

## 2019-04-08 DIAGNOSIS — O26893 Other specified pregnancy related conditions, third trimester: Secondary | ICD-10-CM | POA: Diagnosis present

## 2019-04-08 DIAGNOSIS — O34219 Maternal care for unspecified type scar from previous cesarean delivery: Secondary | ICD-10-CM | POA: Diagnosis not present

## 2019-04-08 DIAGNOSIS — O99824 Streptococcus B carrier state complicating childbirth: Secondary | ICD-10-CM | POA: Diagnosis present

## 2019-04-08 DIAGNOSIS — Z20828 Contact with and (suspected) exposure to other viral communicable diseases: Secondary | ICD-10-CM | POA: Diagnosis present

## 2019-04-08 DIAGNOSIS — Z3493 Encounter for supervision of normal pregnancy, unspecified, third trimester: Secondary | ICD-10-CM

## 2019-04-08 DIAGNOSIS — O99344 Other mental disorders complicating childbirth: Secondary | ICD-10-CM | POA: Diagnosis present

## 2019-04-08 DIAGNOSIS — Z3A39 39 weeks gestation of pregnancy: Secondary | ICD-10-CM | POA: Diagnosis not present

## 2019-04-08 DIAGNOSIS — F419 Anxiety disorder, unspecified: Secondary | ICD-10-CM | POA: Diagnosis present

## 2019-04-08 LAB — CBC
HCT: 37.7 % (ref 36.0–46.0)
Hemoglobin: 13.5 g/dL (ref 12.0–15.0)
MCH: 33.4 pg (ref 26.0–34.0)
MCHC: 35.8 g/dL (ref 30.0–36.0)
MCV: 93.3 fL (ref 80.0–100.0)
Platelets: 196 10*3/uL (ref 150–400)
RBC: 4.04 MIL/uL (ref 3.87–5.11)
RDW: 12.1 % (ref 11.5–15.5)
WBC: 6.6 10*3/uL (ref 4.0–10.5)
nRBC: 0 % (ref 0.0–0.2)

## 2019-04-08 LAB — TYPE AND SCREEN
ABO/RH(D): O POS
Antibody Screen: NEGATIVE

## 2019-04-08 LAB — RPR: RPR Ser Ql: NONREACTIVE

## 2019-04-08 LAB — ABO/RH: ABO/RH(D): O POS

## 2019-04-08 MED ORDER — ZOLPIDEM TARTRATE 5 MG PO TABS: 5.0000 mg | ORAL_TABLET | Freq: Every evening | ORAL | Status: DC | PRN

## 2019-04-08 MED ORDER — COCONUT OIL OIL: 1.0000 "application " | TOPICAL_OIL | Status: DC | PRN

## 2019-04-08 MED ORDER — OXYCODONE-ACETAMINOPHEN 5-325 MG PO TABS: 1.0000 | ORAL_TABLET | ORAL | Status: DC | PRN

## 2019-04-08 MED ORDER — OXYTOCIN 40 UNITS IN NORMAL SALINE INFUSION - SIMPLE MED
2.5000 [IU]/h | INTRAVENOUS | Status: DC
  Filled 2019-04-08: qty 1000

## 2019-04-08 MED ORDER — ONDANSETRON HCL 4 MG/2ML IJ SOLN: 4.0000 mg | INTRAMUSCULAR | Status: DC | PRN

## 2019-04-08 MED ORDER — ONDANSETRON HCL 4 MG PO TABS: 4.0000 mg | ORAL_TABLET | ORAL | Status: DC | PRN

## 2019-04-08 MED ORDER — PHENYLEPHRINE 40 MCG/ML (10ML) SYRINGE FOR IV PUSH (FOR BLOOD PRESSURE SUPPORT)
80.0000 ug | PREFILLED_SYRINGE | INTRAVENOUS | Status: DC | PRN
  Filled 2019-04-08: qty 10

## 2019-04-08 MED ORDER — SIMETHICONE 80 MG PO CHEW: 80.0000 mg | CHEWABLE_TABLET | ORAL | Status: DC | PRN

## 2019-04-08 MED ORDER — SOD CITRATE-CITRIC ACID 500-334 MG/5ML PO SOLN: 30.0000 mL | ORAL | Status: DC | PRN

## 2019-04-08 MED ORDER — SODIUM CHLORIDE (PF) 0.9 % IJ SOLN
INTRAMUSCULAR | Status: DC | PRN
  Administered 2019-04-08: 12 mL/h via EPIDURAL

## 2019-04-08 MED ORDER — LACTATED RINGERS IV SOLN
INTRAVENOUS | Status: DC
  Administered 2019-04-08 (×3): via INTRAVENOUS

## 2019-04-08 MED ORDER — LACTATED RINGERS IV SOLN: 500.0000 mL | Freq: Once | INTRAVENOUS | Status: DC

## 2019-04-08 MED ORDER — WITCH HAZEL-GLYCERIN EX PADS
1.0000 "application " | MEDICATED_PAD | CUTANEOUS | Status: DC | PRN
  Administered 2019-04-09: 1 via TOPICAL

## 2019-04-08 MED ORDER — BENZOCAINE-MENTHOL 20-0.5 % EX AERO
1.0000 "application " | INHALATION_SPRAY | CUTANEOUS | Status: DC | PRN
  Filled 2019-04-08: qty 56

## 2019-04-08 MED ORDER — LACTATED RINGERS IV SOLN
500.0000 mL | INTRAVENOUS | Status: DC | PRN
  Administered 2019-04-08: 250 mL via INTRAVENOUS
  Administered 2019-04-08: 500 mL via INTRAVENOUS

## 2019-04-08 MED ORDER — BUTORPHANOL TARTRATE 1 MG/ML IJ SOLN
1.0000 mg | INTRAMUSCULAR | Status: DC | PRN
  Administered 2019-04-08: 1 mg via INTRAVENOUS
  Filled 2019-04-08: qty 1

## 2019-04-08 MED ORDER — DIPHENHYDRAMINE HCL 50 MG/ML IJ SOLN: 12.5000 mg | INTRAMUSCULAR | Status: DC | PRN

## 2019-04-08 MED ORDER — PHENYLEPHRINE 40 MCG/ML (10ML) SYRINGE FOR IV PUSH (FOR BLOOD PRESSURE SUPPORT): 80.0000 ug | PREFILLED_SYRINGE | INTRAVENOUS | Status: DC | PRN

## 2019-04-08 MED ORDER — DIPHENHYDRAMINE HCL 25 MG PO CAPS: 25.0000 mg | ORAL_CAPSULE | Freq: Four times a day (QID) | ORAL | Status: DC | PRN

## 2019-04-08 MED ORDER — EPHEDRINE 5 MG/ML INJ: 10.0000 mg | INTRAVENOUS | Status: DC | PRN

## 2019-04-08 MED ORDER — SERTRALINE HCL 25 MG PO TABS
25.0000 mg | ORAL_TABLET | Freq: Every day | ORAL | Status: DC
  Administered 2019-04-08 – 2019-04-09 (×2): 25 mg via ORAL
  Filled 2019-04-08 (×2): qty 1

## 2019-04-08 MED ORDER — DIBUCAINE (PERIANAL) 1 % EX OINT
1.0000 "application " | TOPICAL_OINTMENT | CUTANEOUS | Status: DC | PRN
  Administered 2019-04-09: 1 via RECTAL
  Filled 2019-04-08: qty 28

## 2019-04-08 MED ORDER — SENNOSIDES-DOCUSATE SODIUM 8.6-50 MG PO TABS
2.0000 | ORAL_TABLET | ORAL | Status: DC
  Administered 2019-04-08 – 2019-04-09 (×2): 2 via ORAL
  Filled 2019-04-08 (×2): qty 2

## 2019-04-08 MED ORDER — OXYTOCIN 40 UNITS IN NORMAL SALINE INFUSION - SIMPLE MED
1.0000 m[IU]/min | INTRAVENOUS | Status: DC
  Administered 2019-04-08: 1 m[IU]/min via INTRAVENOUS
  Filled 2019-04-08: qty 1000

## 2019-04-08 MED ORDER — LIDOCAINE HCL (PF) 1 % IJ SOLN
INTRAMUSCULAR | Status: DC | PRN
  Administered 2019-04-08 (×2): 4 mL via EPIDURAL

## 2019-04-08 MED ORDER — OXYCODONE-ACETAMINOPHEN 5-325 MG PO TABS: 2.0000 | ORAL_TABLET | ORAL | Status: DC | PRN

## 2019-04-08 MED ORDER — FENTANYL-BUPIVACAINE-NACL 0.5-0.125-0.9 MG/250ML-% EP SOLN
12.0000 mL/h | EPIDURAL | Status: DC | PRN
  Filled 2019-04-08: qty 250

## 2019-04-08 MED ORDER — OXYTOCIN 40 UNITS IN NORMAL SALINE INFUSION - SIMPLE MED: 1.0000 m[IU]/min | INTRAVENOUS | Status: DC

## 2019-04-08 MED ORDER — ACETAMINOPHEN 325 MG PO TABS
650.0000 mg | ORAL_TABLET | ORAL | Status: DC | PRN
  Administered 2019-04-09 (×2): 650 mg via ORAL
  Filled 2019-04-08 (×2): qty 2

## 2019-04-08 MED ORDER — TERBUTALINE SULFATE 1 MG/ML IJ SOLN: 0.2500 mg | Freq: Once | INTRAMUSCULAR | Status: DC | PRN

## 2019-04-08 MED ORDER — IBUPROFEN 600 MG PO TABS
600.0000 mg | ORAL_TABLET | Freq: Four times a day (QID) | ORAL | Status: DC
  Administered 2019-04-08 – 2019-04-10 (×5): 600 mg via ORAL
  Filled 2019-04-08 (×5): qty 1

## 2019-04-08 MED ORDER — PENICILLIN G 3 MILLION UNITS IVPB - SIMPLE MED
3.0000 10*6.[IU] | INTRAVENOUS | Status: DC
  Administered 2019-04-08 (×3): 3 10*6.[IU] via INTRAVENOUS
  Filled 2019-04-08 (×3): qty 100

## 2019-04-08 MED ORDER — ACETAMINOPHEN 325 MG PO TABS: 650.0000 mg | ORAL_TABLET | ORAL | Status: DC | PRN

## 2019-04-08 MED ORDER — OXYTOCIN BOLUS FROM INFUSION
500.0000 mL | Freq: Once | INTRAVENOUS | Status: AC
  Administered 2019-04-08: 500 mL via INTRAVENOUS

## 2019-04-08 MED ORDER — ONDANSETRON HCL 4 MG/2ML IJ SOLN
4.0000 mg | Freq: Four times a day (QID) | INTRAMUSCULAR | Status: DC | PRN
  Administered 2019-04-08: 4 mg via INTRAVENOUS
  Filled 2019-04-08: qty 2

## 2019-04-08 MED ORDER — SODIUM CHLORIDE 0.9 % IV SOLN
5.0000 10*6.[IU] | Freq: Once | INTRAVENOUS | Status: AC
  Administered 2019-04-08: 5 10*6.[IU] via INTRAVENOUS
  Filled 2019-04-08: qty 5

## 2019-04-08 MED ORDER — LIDOCAINE HCL (PF) 1 % IJ SOLN: 30.0000 mL | INTRAMUSCULAR | Status: DC | PRN

## 2019-04-08 MED ORDER — PRENATAL MULTIVITAMIN CH
1.0000 | ORAL_TABLET | Freq: Every day | ORAL | Status: DC
  Administered 2019-04-09: 1 via ORAL
  Filled 2019-04-08: qty 1

## 2019-04-08 NOTE — Consult Note (Addendum)
Neonatology Note:   Attendance at Delivery:    I was asked by Dr. Nelda Marseille to attend this vaginal delivery at term for meconium. The mother is a G3P0101, GBS positive (aIAP) with good prenatal care complicated by anxiety (on Zoloft). ROM 4h 82m prior to delivery, fluid meconium. Infant vigorous with good spontaneous cry and tone initially then stiffened and stopped breathing and became cyanotic.  Brought to warmer where she was warmed, dried and stimulated. HR gradually dropping and infant becoming more cyanotic.  Brief gasp then continuation of generalized stiffness with arching back and apnea.  HR dropped again to <100 then <60.  PPV started with good response in HR and  occasional breathes followed by relaxation, regular breathing and then repeat of hypertonic and apneic state 2 more times (until 79min 30sec of life).  HR remained stable.  Sao2 placed and appropriate.  Received bulb suctioning with minimal output. Ap 6/9. Lungs clear to ausc in DR. Slightly hypertonic, some acrocyanosis otherwise pink.  D/w family and OB; they readily agreed to transfer to NICU for Transitional Care for monitoring.  Father accompanied [place] to NICU.    Monia Sabal Katherina Mires, MD

## 2019-04-08 NOTE — Anesthesia Preprocedure Evaluation (Signed)
Anesthesia Evaluation  Patient identified by MRN, date of birth, ID band Patient awake    Reviewed: Allergy & Precautions, Patient's Chart, lab work & pertinent test results  History of Anesthesia Complications Negative for: history of anesthetic complications  Airway Mallampati: II  TM Distance: >3 FB Neck ROM: Full    Dental no notable dental hx.    Pulmonary neg pulmonary ROS,    Pulmonary exam normal        Cardiovascular negative cardio ROS Normal cardiovascular exam     Neuro/Psych Anxiety negative neurological ROS  negative psych ROS   GI/Hepatic negative GI ROS, Neg liver ROS,   Endo/Other  negative endocrine ROS  Renal/GU negative Renal ROS  negative genitourinary   Musculoskeletal negative musculoskeletal ROS (+)   Abdominal   Peds  Hematology negative hematology ROS (+)   Anesthesia Other Findings Day of surgery medications reviewed with patient.  Reproductive/Obstetrics (+) Pregnancy Hx of C/S x1                             Anesthesia Physical Anesthesia Plan  ASA: II  Anesthesia Plan: Epidural   Post-op Pain Management:    Induction:   PONV Risk Score and Plan: Treatment may vary due to age or medical condition  Airway Management Planned: Natural Airway  Additional Equipment:   Intra-op Plan:   Post-operative Plan:   Informed Consent: I have reviewed the patients History and Physical, chart, labs and discussed the procedure including the risks, benefits and alternatives for the proposed anesthesia with the patient or authorized representative who has indicated his/her understanding and acceptance.       Plan Discussed with:   Anesthesia Plan Comments:         Anesthesia Quick Evaluation

## 2019-04-08 NOTE — Progress Notes (Signed)
OB PN:  S: Pt resting comfortably with epidural  O: BP (!) 98/53   Pulse 64   Temp 99 F (37.2 C)   Resp 14   Ht 5\' 10"  (1.778 m)   Wt 82.9 kg   SpO2 100%   Breastfeeding Unknown   BMI 26.21 kg/m   FHT: 140bpm, moderate variablity, + accels, no decels Toco: q3-74min SVE: 4-5/60/-2- AROM- moderate meconium  A/P: 33 y.o. G2P0101 @ [redacted]w[redacted]d for elective IOL 1. FWB: Cat. I 2. Labor: continue Pit per protocol Pain: continue epidural GBS: IV PCN per protocol Anxiety: zoloft 25mg  daily  Janyth Pupa, DO [phone] (cell) [phone] (office)

## 2019-04-08 NOTE — Lactation Note (Signed)
This note was copied from a baby's chart. Lactation Consultation Note  Patient Name: Beverly Gonzalez Today's Date: 04/08/2019 Reason for consult: Initial assessment;Mother's request;NICU baby;Term  2100 - 2120 - I visited Beverly Gonzalez and her 33 old daughter, Beverly Gonzalez, in NICU. Beverly Gonzalez was attempting to breast feed upon entry. I offered to assist, and I moved baby into football hold on the left breast.  Beverly Gonzalez can hand express, and she has copious colostrum. She has a 33 year old at home, which she pumped exclusively for. She reports that she had a strong milk supply, potential oversupply. Her breasts appear to be filling, and she has noted that it already appears that her milk is transitioning.  I showed Beverly Gonzalez and her support person how to sandwich the breast. Beverly Gonzalez latched and maintained latch with rythmic suckling sequences for about 6 minutes. I showed the couple how to do breast compressions to keep Beverly Gonzalez active at the breast, and she responded well to them.  Beverly Gonzalez fell asleep at 6 minutes in, and we placed her on Beverly Gonzalez chest, STS. Baby will be transferred up to [facility] this evening.  I offered to bring in a hand pump to the room and encouraged her to either do a little pumping or hand expression. I provided a foley cup and showed her how to use it.  PLAN: - Breast feed 8-12 times a day on demand. Wake to feed as needed. - Post-pump and supplement 5-10 mls of EBM to baby by cup (as a precaution). Beverly Gonzalez states that she prefers to HE this milk tonight, but I offered to drop off a manual pump. - Lactation to follow up on 10/23. - Recommended OP follow up due to hx of oversupply and first time breast feeding. Beverly Gonzalez expressed interest.    Maternal Data Formula Feeding for Exclusion: No Has patient been taught Hand Expression?: Yes Does the patient have breastfeeding experience prior to this delivery?: Yes  Feeding Feeding Type: Breast  Fed  LATCH Score Latch: Grasps breast easily, tongue down, lips flanged, rhythmical sucking.  Audible Swallowing: A few with stimulation  Type of Nipple: Everted at rest and after stimulation  Comfort (Breast/Nipple): Soft / non-tender  Hold (Positioning): Assistance needed to correctly position infant at breast and maintain latch.  LATCH Score: 8  Interventions Interventions: Breast feeding basics reviewed;Assisted with latch;Breast massage;Hand express;Breast compression;Support pillows;Adjust position;Hand pump  Lactation Tools Discussed/Used Tools: Other (comment)(manual pump) Pump Review: Setup, frequency, and cleaning   Consult Status Consult Status: Follow-up Date: 04/09/19 Follow-up type: In-patient    Lenore Manner 04/08/2019, 9:34 PM

## 2019-04-08 NOTE — Anesthesia Procedure Notes (Signed)
Epidural Patient location during procedure: OB Start time: 04/08/2019 7:21 AM End time: 04/08/2019 7:24 AM  Staffing Anesthesiologist: Brennan Bailey, MD Performed: anesthesiologist   Preanesthetic Checklist Completed: patient identified, pre-op evaluation, timeout performed, IV checked, risks and benefits discussed and monitors and equipment checked  Epidural Patient position: sitting Prep: site prepped and draped and DuraPrep Patient monitoring: continuous pulse ox, blood pressure and heart rate Approach: midline Location: L3-L4 Injection technique: LOR air  Needle:  Needle type: Tuohy  Needle gauge: 17 G Needle length: 9 cm Needle insertion depth: 5 cm Catheter type: closed end flexible Catheter size: 19 Gauge Catheter at skin depth: 9 cm Test dose: negative and Other (1% lidocaine)  Assessment Events: blood not aspirated, injection not painful, no injection resistance, negative IV test and no paresthesia  Additional Notes Patient identified. Risks, benefits, and alternatives discussed with patient including but not limited to bleeding, infection, nerve damage, paralysis, failed block, incomplete pain control, headache, blood pressure changes, nausea, vomiting, reactions to medication, itching, and postpartum back pain. Confirmed with bedside nurse the patient's most recent platelet count. Confirmed with patient that they are not currently taking any anticoagulation, have any bleeding history, or any family history of bleeding disorders. Patient expressed understanding and wished to proceed. All questions were answered. Sterile technique was used throughout the entire procedure. Please see nursing notes for vital signs.   Crisp LOR on first pass. Test dose was given through epidural catheter and negative prior to continuing to dose epidural or start infusion. Warning signs of high block given to the patient including shortness of breath, tingling/numbness in hands, complete  motor block, or any concerning symptoms with instructions to call for help. Patient was given instructions on fall risk and not to get out of bed. All questions and concerns addressed with instructions to call with any issues or inadequate analgesia.  Reason for block:procedure for pain

## 2019-04-08 NOTE — Progress Notes (Signed)
OB PN:  S: Pt resting comfortably, no acute complaints  O: BP 108/64   Pulse (!) 46   Temp 98.2 F (36.8 C) (Oral)   Resp 19   Ht 5\' 10"  (1.778 m)   Wt 82.9 kg   SpO2 99%   Breastfeeding Unknown   BMI 26.21 kg/m   FHT: 120bpm, moderate variablity, + accels, no decels Toco: irregular SVE: 1/50/-3, Cook balloon placed using sterile speculum- 50cc  A/P: 33 y.o. G3P0101 @ [redacted]w[redacted]d for eIOL- TOLAC 1. FWB: Cat. I 2. Labor: continue Pit per protocol, Cook in place Pain: IV or epidural upon request GBS: positive, continue PCN per protocol Anxiety: continue zoloft daily  Janyth Pupa, DO [phone] (cell) [phone] (office)

## 2019-04-09 LAB — CBC
HCT: 35.5 % — ABNORMAL LOW (ref 36.0–46.0)
Hemoglobin: 12.5 g/dL (ref 12.0–15.0)
MCH: 33.9 pg (ref 26.0–34.0)
MCHC: 35.2 g/dL (ref 30.0–36.0)
MCV: 96.2 fL (ref 80.0–100.0)
Platelets: 168 10*3/uL (ref 150–400)
RBC: 3.69 MIL/uL — ABNORMAL LOW (ref 3.87–5.11)
RDW: 12.6 % (ref 11.5–15.5)
WBC: 8.9 10*3/uL (ref 4.0–10.5)
nRBC: 0 % (ref 0.0–0.2)

## 2019-04-09 NOTE — Lactation Note (Signed)
This note was copied from a baby's chart. Lactation Consultation Note:  Mother has a  of a 2.33 yr old. Mother reports that she exclusively pumped for 20 months. Mothers first child was in the NICU for 46 days.  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 This is her first experience with an infant at the bedside.  Discussed importance of cue base feeding and doing frequent STS.   Mother was hand expressing into a colostrum vial when I arrived in the room. She has 2 half colostrum vials at the bedside and approx 5 colostrum botte's in the refrigerator.  Mother reports that infant is feeding well. Staff nurse reports that infant has a good latch.  Mother swaddled in FOB arms.   Discussed limiting hand expression today and just letting infant cue base feed. Discussed that infant will likely cluster feed and that this is normal  behavior for the second night of life.  Advised mother to breastfeed infant well and if she still feels slightly full, hand express for only a few mins.   Tried to reassure mother that she could relax with the expressing as infant will cue base feed and regulate the amt of milk that she needs.   Lots of support and praise given to mother. FOB very supportive. Mother reports that she  has not slept all day . Advised mother to get rest and try to nap with infant is sleeping.   Mother was given [facility] brochure and informed of available [place] services.     Patient Name: Beverly Gonzalez Today's Date: 04/09/2019 Reason for consult: Initial assessment   Maternal Data    Feeding Feeding Type: Breast Fed  LATCH Score                   Interventions Interventions: Hand express  Lactation Tools Discussed/Used     Consult Status Consult Status: Follow-up Date: 04/10/19 Follow-up type: In-patient    Jess Barters [facility] 04/09/2019, 4:23 PM

## 2019-04-09 NOTE — Progress Notes (Addendum)
Postpartum Note Day # 1  S:  Patient resting comfortable in bed.  Pain controlled.  Tolerating general diet. No flatus, no BM.  Lochia moderate.  Ambulating without difficulty.  She denies n/v/f/c, SOB, or CP.  Pt plans on breastfeeding  O: Temp:  [97.9 F (36.6 C)-99 F (37.2 C)] 98.4 F (36.9 C) (10/23 0045) Pulse Rate:  [51-77] 58 (10/23 0540) Resp:  [14-18] 18 (10/23 0540) BP: (94-118)/(49-84) 105/69 (10/23 0540) SpO2:  [97 %-100 %] 100 % (10/23 0540)   Gen: A&Ox3, NAD CV: RRR, no MRG Resp: CTAB Abdomen: soft, NT, ND Uterus: firm, non-tender, below umbilicus Ext: No edema, no calf tenderness bilaterally, SCDs in place  Labs:  Recent Labs    04/08/19 0009 04/09/19 0550  HGB 13.5 12.5    A/P: Pt is a 33 y.o. [id number] s/p VBAC, #1  - Pain well controlled -GU: voiding freely -GI: Tolerating general diet -Activity: encouraged sitting up to chair and ambulation as tolerated -Prophylaxis: early ambulation -Labs: stable as above  DISPO: Will consider early discharge home today pending baby's status  Janyth Pupa, DO [phone] (cell) [phone] (office)

## 2019-04-10 ENCOUNTER — Other Ambulatory Visit: Payer: Self-pay

## 2019-04-10 MED ORDER — IBUPROFEN 600 MG PO TABS: 600.0000 mg | ORAL_TABLET | Freq: Four times a day (QID) | ORAL | 0 refills | Status: DC

## 2019-04-10 NOTE — Discharge Instructions (Signed)

## 2019-04-10 NOTE — Lactation Note (Signed)
This note was copied from a baby's chart. Lactation Consultation Note  Patient Name: Beverly Gonzalez Today's Date: 04/10/2019   Mom has a hx of oversupply. With her 1st baby, she could pump 90 oz/day. She donated over 900 oz to [place]. Having so much milk was a very uncomfortable experience for Mom & she does not want to repeat it.   B/c this infant (like her 1st) is now in the NICU, it did not seem prudent to have Mom on the same pumping regimen as with her 1st child. For now, I told Mom to pump q4hrs for 15-20 min (she can pump earlier if her breasts tell her to do so).   I told Mom her pumping regimen may need to be adjusted on a week-by-week basis, depending on how much she can express, how long infant remains in the NICU, breast comfort, etc. Mom knows which number to call to speak with a lactation consultant.   Mom has 3 DEBPs at home (Spectra 1; Spectra 2; and Medela Freestyle).    Matthias [facility] [place] 04/10/2019, 9:37 AM

## 2019-04-10 NOTE — Lactation Note (Signed)
This note was copied from a baby's chart. Lactation Consultation Note  Patient Name: Beverly Gonzalez [id number] Date: 04/10/2019 Reason for consult: Follow-up assessment;Mother's request P2, 55 hour female infant. Per mom, breastfeeding was going well, until infant was separated and transfered to NICU due to questionable seizures. Mom was concern about pumping,  due to history of oversupply with let down infant was choking and gagging at first and then would only take bottle nipple.  LC discussed with mom to use DEBP to  maintain her milk supply while separated from infant. Mom will pump every 3 hours both breast on initial setting for 15 minutes. Mom shown how to use DEBP & how to disassemble, clean, & reassemble parts. LC discussed with  Mom her  past hx of oversupply with her son, if she has a high let down to pump first for a few minutes and then latch infant to breast  afterwards. LC explain each pregnancy and breastfeeding experience is different. Infant was term unlike her son , who was LPTI and was in NICU for months.  Mom knows to call Nurse or LC if she has any further questions or concerns.  Maternal Data    Feeding Feeding Type: Breast Fed  LATCH Score                   Interventions Interventions: Hand express;DEBP  Lactation Tools Discussed/Used Pump Review: Setup, frequency, and cleaning;Milk Storage Initiated by:: Vicente Serene, IBCLC Date initiated:: 04/10/19   Consult Status Consult Status: Follow-up Date: 04/10/19 Follow-up type: In-patient    Vicente Serene 04/10/2019, 1:50 AM

## 2019-04-10 NOTE — Anesthesia Postprocedure Evaluation (Signed)
Anesthesia Post Note  Patient: Beverly Gonzalez  Procedure(s) Performed: AN AD [place]     Patient location during evaluation: Mother Baby Anesthesia Type: Epidural Level of consciousness: awake and alert Pain management: pain level controlled Vital Signs Assessment: post-procedure vital signs reviewed and stable Respiratory status: spontaneous breathing, nonlabored ventilation and respiratory function stable Cardiovascular status: stable Postop Assessment: no headache, no backache and epidural receding Anesthetic complications: no    Last Vitals:  Vitals:   04/09/19 1500 04/09/19 2326  BP: 115/65 112/77  Pulse: 60 (!) 55  Resp: 18 16  Temp: 36.6 C 36.8 C  SpO2:      Last Pain:  Vitals:   04/10/19 0200  TempSrc:   PainSc: 0-No pain   Pain Goal:                   Rayvon Char

## 2019-04-10 NOTE — Discharge Summary (Signed)
OB Discharge Summary     Patient Name: Beverly Gonzalez DOB: [birth year] MRN: [id number]  Date of admission: 04/08/2019 Delivering MD: Janyth Pupa   Date of discharge: 04/10/2019  Admitting diagnosis: PREG Intrauterine pregnancy: [redacted]w[redacted]d     Secondary diagnosis:  Active Problems:   Normal intrauterine pregnancy in third trimester  Additional problems: anxiety     Discharge diagnosis: Term Pregnancy Delivered                                                                                                Post partum procedures:none  Augmentation: AROM, Pitocin and Foley Balloon  Complications: None  Hospital course:  Induction of Labor With Vaginal Delivery   33 y.o. yo [name] at [redacted]w[redacted]d was admitted to the hospital 04/08/2019 for induction of labor.  Indication for induction: elective.  Patient had an uncomplicated labor course as follows: Membrane Rupture Time/Date: 12:47 PM ,04/08/2019   Intrapartum Procedures: Episiotomy: None [1]                                         Lacerations:  Labial [10]  Patient had delivery of a Viable infant.  Information for the patient's newborn:  Yessika, Kraushaar [id number]  Delivery Method: VBAC, Spontaneous(Filed from Delivery Summary)    04/08/2019  Details of delivery can be found in separate delivery note.  Patient had a routine postpartum course. Patient is discharged home 04/10/19.  Physical exam  Vitals:   04/09/19 0540 04/09/19 1500 04/09/19 2326 04/10/19 0821  BP: 105/69 115/65 112/77 114/83  Pulse: (!) 58 60 (!) 55 (!) 59  Resp: 18 18 16 17   Temp:  97.9 F (36.6 C) 98.2 F (36.8 C) 97.7 F (36.5 C)  TempSrc:  Oral Oral Axillary  SpO2: 100%     Weight:      Height:       General: alert, cooperative and no distress Lochia: appropriate Uterine Fundus: firm Incision: N/A DVT Evaluation: No evidence of DVT seen on physical exam. Labs: Lab Results  Component Value Date   WBC 8.9 04/09/2019   HGB  12.5 04/09/2019   HCT 35.5 (L) 04/09/2019   MCV 96.2 04/09/2019   PLT 168 04/09/2019   CMP Latest Ref Rng & Units 04/24/2017  Glucose 65 - 99 mg/dL 78  BUN 6 - 20 mg/dL 22(H)  Creatinine 0.44 - 1.00 mg/dL 0.75  Sodium 135 - 145 mmol/L 140  Potassium 3.5 - 5.1 mmol/L 4.1  Chloride 101 - 111 mmol/L 104  CO2 22 - 32 mmol/L 28  Calcium 8.9 - 10.3 mg/dL 9.8  Total Protein 6.5 - 8.1 g/dL 7.2  Total Bilirubin 0.3 - 1.2 mg/dL 1.4(H)  Alkaline Phos 38 - 126 U/L 117  AST 15 - 41 U/L 33  ALT 14 - 54 U/L 27    Discharge instruction: per After Visit Summary and "Baby and Me Booklet".  After visit meds:  Allergies as of 04/10/2019   No Known Allergies  Medication List    STOP taking these medications   ASPIRIN 81 PO   DICLEGIS PO   PHENERGAN PO   ZOFRAN PO     TAKE these medications   ibuprofen 600 MG tablet Commonly known as: ADVIL Take 1 tablet (600 mg total) by mouth every 6 (six) hours.   PRENATAL PO Take 1 tablet by mouth daily.   sertraline 25 MG tablet Commonly known as: ZOLOFT Take 25 mg by mouth daily.   VITAMIN D PO Take by mouth.       Diet: routine diet  Activity: Advance as tolerated. Pelvic rest for 6 weeks.   Outpatient follow up:6 weeks Follow up Appt:No future appointments. Follow up Visit:No follow-ups on file.  Postpartum contraception: Undecided  Newborn Data: Live born female  Birth Weight: 7 lb 5.8 oz (3340 g) APGAR: 6, 9  Newborn Delivery   Birth date/time: 04/08/2019 16:58:00 Delivery type: VBAC, Spontaneous      Baby Feeding: Breast Disposition:NICU   04/10/2019 Annalee Genta, DO

## 2019-04-12 LAB — SURGICAL PATHOLOGY

## 2019-04-14 ENCOUNTER — Ambulatory Visit: Payer: Self-pay

## 2019-04-14 NOTE — Lactation Note (Signed)
This note was copied from a baby's chart. Lactation Consultation Note  Patient Name: Beverly Gonzalez Today's Date: 04/14/2019    Visited with mom of a 66 days old FT NICU female, baby is currently on breastmilk, but mom has also been taking her to breast. She's now using a NS # 20 due to strong MER, mom told LC that she had the same problem with baby # 1, Hx of oversupply and baby would get so much milk during let down that he'd choke, that's why she had to exclusively pump and bottle feed.   She really wants to do things different with this baby though and would like to downregulate her oversupply. Baby is getting 53 ml per feeding every 3 hours, but she's already pumping 7 oz. combined out of both breast. She told LC she pumped the left one for 5 minutes and the right one for 10 minutes (it's the bigger one) and she still feels her breasts are full, she didn't fully empty the breast because the supply would just come back up.  Discussed some tips about how to regulate her milk supply, mom will start doing block feedings, she's aware that her milk supply is not fully established yet but due to her history, she feels this is the right thing to do. Mom requested a 3 pm latch assist with lactation, baby was very fussy and wouldn't latch at first, mom had to change her diaper, undress her and take her STS to her left breast.  After baby calmed down (she got mad when mom took her clothes off) LC did some suck training a finally got baby to suck in rhythmical patter, had to do this a couple of times until she stays on for the rest of the feeding. Once baby latched on in football position she was able to sustain the latch for 12 minutes with a few audible swallows noted with stimulation only, she'd fall asleep at the breast. Once she was done, there were several drops of EBM on the shield.  Feeding plan  1. Mom will continue pumping, but she'll do it every 4 hours now, will try blocked feedings if  her comfort level permits 2. She'll continue putting baby to breast on cues, ideally 8-12 times/24 hours as long as is OK with NICU staff 3. She'll F/U with lactation PRN to check on her supply status  Mom reported all questions and concerns were answered, she's aware of LC OP services and will call PRN.  Maternal Data    Feeding Feeding Type: Breast Milk  LATCH Score Latch: Repeated attempts needed to sustain latch, nipple held in mouth throughout feeding, stimulation needed to elicit sucking reflex.  Audible Swallowing: None  Type of Nipple: Everted at rest and after stimulation  Comfort (Breast/Nipple): Soft / non-tender  Hold (Positioning): Assistance needed to correctly position infant at breast and maintain latch.  LATCH Score: 6  Interventions    Lactation Tools Discussed/Used     Consult Status      Ismael Treptow Francene Boyers 04/14/2019, 3:37 PM

## 2019-04-22 ENCOUNTER — Ambulatory Visit: Payer: Self-pay

## 2019-04-22 NOTE — Lactation Note (Signed)
This note was copied from a baby's chart. Lactation Consultation Note  Patient Name: Beverly Gonzalez [id number] Date: 04/22/2019     04/22/2019  Name: Beverly Gonzalez MRN: [id number] Date of Birth: 04/08/2019 Gestational Age: Gestational Age: [redacted]w[redacted]d Birth Weight: 117.8 oz Weight today:  Weight: 7 lb 0.3 oz (9966 g)   33 week old infant presents today with mom for feeding assessment. Mom reports BF is going well. Mom reports she is really full and is pumping a lot and storing it.   Mom reports she pumped with her son until she got pregnant with Ovid Curd. Milk supply decreased significantly. Mom tried limited time and volume of pumping, decreased suction. She tried Benadryl and cabbage leaves for 5 days, mom reports it did not work. At her highest she was pumping 80 ounces a day in the beginning and was pumping 2.5 ounces per breast when her son was almost 33 years old.   Infant has gained 19 grams in the last 5 days with an average daily weight gain of 3.8 grams a day. Mom was informed infant weight gain is not adequate. She is voiding and stooling well. Infant alert and active. Reviewed with mom that with her milk supply, infant is most likely getting Fore milk and not getting to the hind milk. Asked mom to prepump and then feed infant instead of post pumping.   Infant is eating about every 2-2.5 hours for 20-25 minutes. Infant is feeding on one breast per feeding, sometimes 2. Mom feels like infant is latching well. Mom has been full and in pain since her milk came in.   Infant is using the NS with each feeding. Mom reports infant is not as organized on the breast without the NS. Mom reports infant BF better than bottle feeds. Discussed with mom that the goal is to wean off the NS as soon as infant is able. Discussed she will most likely need to prepump to get infant latched to the breast.   Infant with thick labial frenulum that inserts at the bottom of the gum ridge. Upper lip  flanges well on the breast. Infant with short posterior lingual frenulum. Infant with good tongue extension and lateralization. Infant with limited mid tongue elevation. Infant with strong suckle. Infant choked a lot on the breast, could be flow and/or tongue restriction. Infant using wide based Avent nipple with 0 Nipple and no choking or drooling. Mom was aware of lingual frenulum. Discussed how tongue and lip restrictions can effect milk supply and transfer. Mom given website information and local provider information. Mom to research and decide if infant to be evaluated.   Reviewed prepumping vs post pumping to get infant more hindmilk. Infant is getting adequate volumes with very little efforts but not gaining well. Mom voiced understanding. Mom is pumping for 3-4 minutes per pumping and getting 3-4 ounces on one breast, she only pumps the breast she is feeding on. Milk that was seen in the NS was thin and watery, pumped milk post feeding was thicker and creamier.   Discussed we generally do not like to diagnose an oversupply too early and mom needs to be very careful about decreasing her supply too much. Mom given handouts on decreasing milk supply and oversupply from [email or web address].    Infant to follow up with Dr. Doneen Poisson on Nov. 22. Mom has a scale at home (mommed scale). Scale does not measure down to grams. Advised mom that infant needs a weight check next week. Infant to  follow up with Lactation at earliest convenience.      General Information: Mother's reason for visit: Feeding assessment, concerns with oversupply Consult: Initial Lactation consultant: Ivin Booty Amaree Loisel RN,IBCLC Breastfeeding experience: BF using the NS, mom making a ton of milk Maternal medical conditions: (HELLP and PPH with older child) Maternal medications: Pre-natal vitamin(Vitamin D, Zyrtec, Zoloft)  Breastfeeding History: Frequency of breast feeding: every 2.5-3 hours Duration of feeding: 15-25  minutes  Supplementation: Supplement method: bottle(Avent 0 nipple)         Breast milk volume: 1-1.5 Breast milk frequency: 1-2 x a day Total breast milk volume per day: 2-3 ounces Pump type: Spectra Pump frequency: every 3 hours with feedings Pump volume: 3.5-4 ounces post BF  Infant Output Assessment: Voids per 24 hours: 12 Urine color: Clear yellow Stools per 24 hours: 6-8 Stool color: Yellow(liquidy, mucousy)  Breast Assessment: Breast: Full Nipple: Erect Pain level: 1 Pain interventions: Bra, Breast pump  Feeding Assessment: Infant oral assessment: Variance Infant oral assessment comment: see note Positioning: Cross cradle(right breast, 15 minutes) Latch: 1 - Repeated attempts needed to sustain latch, nipple held in mouth throughout feeding, stimulation needed to elicit sucking reflex. Audible swallowing: 2 - Spontaneous and intermittent Type of nipple: 2 - Everted at rest and after stimulation Comfort: 2 - Soft/non-tender Hold: 2 - No assistance needed to correctly position infant at breast LATCH score: 9 Latch assessment: Deep Lips flanged: Yes Suck assessment: Displays both Tools: Nipple shield 24 mm Pre-feed weight: 3184 grams Post feed weight: 3244 grams Amount transferred: 60 ml    Additional Feeding Assessment:                                    Totals: Total amount transferred: 60 ml Total supplement given: 0 Total amount pumped post feed: did not pump   Plan:  1. Offer infant the breast with feeding cues 2. Keep infant awake with feeding as needed 3. Feed infant skin to skin to keep her awake 4. Massage/compress the breast with feeding cues as needed to keep her active with feeding 5. Use the # 24 NS with feedings as needed to maintain latch. Goal is to wean off the Nipple Shield when you can. Try infant each day without the Nipple shield to see when infant is able to feed without it.  5. When offering the bottle, offer to  infant using the paced bottle feeding method (video on [email or web address]) 6. Infant needs about 58-78 ml (2-2.5 ounces) for 8 feedings a day or 465-620 ml (16-21 ounces) in 24 hours. Infant may take more or less depending on how often she feeds. Feed infant until infant is satisfied.  7. Would recommend that you pump 2-3 ounces of milk off the breast before latch to make sure infant is getting the fatter milk. Can use your DEBP and/or your Haakaa. Only pump from the breast that you are using to feed.  8. Can try Block Feeding as described in [email or web address] handout for 12-24 hours to see if we can decrease your supply some.  9. Keep up the good work 10. Thank you for allowing me to assist you today 12. Please call with any questions or concerns as needed [phone] 13. Follow up with Lactation in 1 week   Bessie, [employer]  Debby Freiberg Brynley Cuddeback 04/22/2019, 1:34 PM

## 2019-04-23 ENCOUNTER — Ambulatory Visit (INDEPENDENT_AMBULATORY_CARE_PROVIDER_SITE_OTHER): Payer: 59 | Admitting: Psychiatry

## 2019-04-23 ENCOUNTER — Other Ambulatory Visit: Payer: Self-pay

## 2019-04-23 DIAGNOSIS — F411 Generalized anxiety disorder: Secondary | ICD-10-CM | POA: Diagnosis not present

## 2019-04-23 NOTE — Progress Notes (Signed)
[facility] Counselor/Therapist Progress Note  Patient ID: Beverly Gonzalez, MRN: [id number],    Date: 04/23/2019  Time Spent: 60 minutes  1:00pm to 2:00pm  Virtual Visit  Note Connected with patient by a video enabled telemedicine/telehealth application or telephone, with their informed consent, and verified patient privacy and that I am speaking with the correct person using two identifiers. I discussed the limitations, risks, security and privacy concerns of performing psychotherapy and management service by telephone and the availability of in person appointments. I also discussed with the patient that there may be a patient responsible charge related to this service. The patient expressed understanding and agreed to proceed. I discussed the treatment planning with the patient. The patient was provided an opportunity to ask questions and all were answered. The patient agreed with the plan and demonstrated an understanding of the instructions. The patient was advised to call  our office if  symptoms worsen or feel they are in a crisis state and need immediate contact.       Therapist Location: [facility] Patient Location: home   Treatment Type: Individual Therapy  Reported Symptoms: anxiety, uneasiness with extended family tensions recently  Mental Status Exam:  Appearance:   n/a   telehealth     Behavior:  Appropriate and Sharing  Motor:  Normal  Speech/Language:   Normal Rate  Affect:  N/A   telehealth  Mood:  anxious  Thought process:  goal directed  Thought content:    WNL  Sensory/Perceptual disturbances:    WNL  Orientation:  oriented to person, place, time/date, situation, day of week, month of year and year  Attention:  Good  Concentration:  Good  Memory:  WNL  Fund of knowledge:   Good  Insight:    Good  Judgment:   Good  Impulse Control:  Good   Risk Assessment: Danger to Self:  No Self-injurious Behavior: No Danger to Others:  No Duty to Warn:no Physical Aggression / Violence:No  Access to Firearms a concern: No  Gang Involvement:No   Subjective:  Patient just had new baby girl 04/08/2019. Some stress and anxiety but feels overall that she's doing well with new baby.  Baby was in ICU for a couple weeks but is now ok and at home.  Hulen Skains, toddler son, is doing well. Stressors within (visiting) family members has created anxiety and tension for patient, and patient reports visiting family will be returning to their homes next week.   Interventions: Solution-Oriented/Positive Psychology and Ego-Supportive  Diagnosis:   ICD-10-CM   1. Generalized anxiety disorder  F41.1      Plan:  Treatment goals may remain on tx plan as patient works on strategies, and progress will be documented each session in the "Progress" section of Plan.  Treatment Goals: Patient not signing tx plan on computer screen due to COVID-19.  Long Term Goal: Enhance ability to handle effectively the full variety of life's anxieties, as evidenced by patient reporting at least 5 days a week her anxiety is rated a 6 or less on a 1-10 scale for anxiety (with 10 being the highest).         (Update---Patient gave birth to her baby on 04/08/2019.)     Short Term Goal: .Increase understanding of beliefs and messages that produce anxiety and worry.  Strategy: Explore cognitive messages that mediate anxiety response and retrain in adaptive cognitions. Remain on her Zoloft unless advised differently by her OB-GYN. Will report progress or lack of  progress in sessions.  PROGRESS: Patient gave birth to baby girl, Ovid Curd, on 04/08/2019.  Baby doing ok after being in NICU for a week.  Some adjustment issues at home, typical of having new baby going home. Shared some concerns about some tension and conflict amongst some family at the home recently.  Short term goal reviewed, as it is very relevant to our session today.  Patient sounding anxious and  apprehensive and did well venting her thoughts/concerns. She was able to see the connection between anxious thoughts and increased feelings of anxiety, and we worked on trying to replace the anxious thougths with more calm, reality-based, and self-affirming thoughts. She was not directly involved in the tension/conflict spoken about, and we discussed the need for her primary focus to be on self-care and her newborn baby at this time.  Patient wrestling with the concerns as to whether these recent tensions are an indicator of increased future tensions.  From her description, it sounded more reactive to very current circumstances rather than something that's likely to fester, however we acknowledged that we don't really know.  Looked at her needing to stay in the present with a positive, self-encouraging thought pattern.  Also separated "what she can control, and what she cannot control" and choosing to work on the things she can control for now.   Goal review and progress/strengths noted with patient.                  Next session within 2 weeks.   [name], LCSW

## 2019-05-01 ENCOUNTER — Ambulatory Visit: Payer: Self-pay

## 2019-05-01 NOTE — Lactation Note (Signed)
This note was copied from a baby's chart. Lactation Consultation Note  Patient Name: Beverly Gonzalez [id number] Date: 05/01/2019 Reason for consult: Initial assessment  Lactation checked in with staff RN regarding patient's needs for breast feeding and pumping. RN indicated that patient's mother is not in the room at this time. She indicated that patient's mother has breast pumping supplies. Patient's mother may call for assistance with a nipple shield fit. I offered to return upon request.  Consult Status Follow-up type: Call as needed   Lenore Manner 05/01/2019, 2:07 PM

## 2019-05-26 DIAGNOSIS — K5900 Constipation, unspecified: Secondary | ICD-10-CM | POA: Diagnosis not present

## 2019-05-26 DIAGNOSIS — N393 Stress incontinence (female) (male): Secondary | ICD-10-CM | POA: Diagnosis not present

## 2019-05-26 DIAGNOSIS — M6281 Muscle weakness (generalized): Secondary | ICD-10-CM | POA: Diagnosis not present

## 2019-05-28 DIAGNOSIS — Z30430 Encounter for insertion of intrauterine contraceptive device: Secondary | ICD-10-CM | POA: Diagnosis not present

## 2019-05-28 DIAGNOSIS — Z3202 Encounter for pregnancy test, result negative: Secondary | ICD-10-CM | POA: Diagnosis not present

## 2019-05-28 DIAGNOSIS — F419 Anxiety disorder, unspecified: Secondary | ICD-10-CM | POA: Diagnosis not present

## 2019-06-01 ENCOUNTER — Ambulatory Visit (INDEPENDENT_AMBULATORY_CARE_PROVIDER_SITE_OTHER): Payer: 59 | Admitting: Psychiatry

## 2019-06-01 DIAGNOSIS — F411 Generalized anxiety disorder: Secondary | ICD-10-CM

## 2019-06-01 NOTE — Progress Notes (Signed)
[facility] Counselor/Therapist Progress Note  Patient ID: Beverly Gonzalez, MRN: [id number],    Date: 06/01/2019  Time Spent: 60 minutes   1:00pm to 2:00pm  Virtual Visit Note Connected with patient by a video enabled telemedicine/telehealth application or telephone, with their informed consent, and verified patient privacy and that I am speaking with the correct person using two identifiers. I discussed the limitations, risks, security and privacy concerns of performing psychotherapy and management service by telephone and the availability of in person appointments. I also discussed with the patient that there may be a patient responsible charge related to this service. The patient expressed understanding and agreed to proceed. I discussed the treatment planning with the patient. The patient was provided an opportunity to ask questions and all were answered. The patient agreed with the plan and demonstrated an understanding of the instructions. The patient was advised to call  our office if  symptoms worsen or feel they are in a crisis state and need immediate contact.   Therapist Location: [facility] Patient Location: home  Treatment Type: Individual Therapy  Reported Symptoms: anxiety, stressed  Mental Status Exam:  Appearance:   n/a   telehealth     Behavior:  Sharing  Motor:  Normal  Speech/Language:   Normal Rate  Affect:  n/a   telehealth  Mood:  anxious  Thought process:  goal directed  Thought content:    WNL  Sensory/Perceptual disturbances:    WNL  Orientation:  oriented to person, place, time/date, situation, day of week, month of year and year  Attention:  Good  Concentration:  Good  Memory:  WNL  Fund of knowledge:   Good  Insight:    Good  Judgment:   Good  Impulse Control:  Good   Risk Assessment: Danger to Self:  No Self-injurious Behavior: No Danger to Others: No Duty to Warn:no Physical Aggression / Violence:No  Access to  Firearms a concern: No  Gang Involvement:No   Subjective: Patient quite anxious over multiple stressors including 2 mos old baby that has had medical issues leading to several hospitalization since birth, including recent hospitalization at [facility].    Interventions: Solution-Oriented/Positive Psychology  Diagnosis:   ICD-10-CM   1. Generalized anxiety disorder  F41.1     Plan: Treatment goals may remain on tx plan as patient works on strategies, and progress will be documented each session in the "Progress" section of Plan.  Treatment Goals: Patient not signing tx plan on computer screen due to COVID-19.  Long Term Goal: Enhance ability to handle effectively the full variety of life's anxieties, as evidenced by patient reporting at least 5 days a week her anxiety is rated a 6 or less on a 1-10 scale for anxiety (with 10 being the highest).         (Update---Patient gave birth to her baby on 04/08/2019.)     Short Term Goal: .Increase understanding of beliefs and messages that produce anxiety and worry.  Strategy: Explore cognitive messages that mediate anxiety response and retrain in adaptive cognitions. Remain on her Zoloft unless advised differently by her OB-GYN. Will report progress or lack of progress in sessions.  PROGRESS:   Patient's infant daughter has had significant issues that has led her to be hospitalized again and on a third occasion at [facility] to see specialist.  Baby is back home at this time.  There is not a conclusive diagnosis so she is continuing to be monitored.  Patient has  been quite anxious during all of this and still caring for her toddler son who is having severe separation anxiety. Today we focused on patient's anxiety and what can help her be able to self-modulate her anxious feelings more, and also looking at anxious thoughts that lead to more intense anxiety--several of these thoughts are not accurate. Processed how to better understand them,  their impact on patient, and also how to interrupt and replace them with more calming, reality-based, and empowering thoughts that do not support anxiety.  Encouraged patient's physical and emotional self-care, and supported activities that she and I discussed that helps her feel more calm and grounded.   Next appt within 2-3 wks.   [name], LCSW

## 2019-06-02 DIAGNOSIS — M62838 Other muscle spasm: Secondary | ICD-10-CM | POA: Diagnosis not present

## 2019-06-02 DIAGNOSIS — K5900 Constipation, unspecified: Secondary | ICD-10-CM | POA: Diagnosis not present

## 2019-06-02 DIAGNOSIS — M6281 Muscle weakness (generalized): Secondary | ICD-10-CM | POA: Diagnosis not present

## 2019-06-02 DIAGNOSIS — N393 Stress incontinence (female) (male): Secondary | ICD-10-CM | POA: Diagnosis not present

## 2019-06-04 DIAGNOSIS — D2272 Melanocytic nevi of left lower limb, including hip: Secondary | ICD-10-CM | POA: Diagnosis not present

## 2019-06-04 DIAGNOSIS — D225 Melanocytic nevi of trunk: Secondary | ICD-10-CM | POA: Diagnosis not present

## 2019-06-04 DIAGNOSIS — D2371 Other benign neoplasm of skin of right lower limb, including hip: Secondary | ICD-10-CM | POA: Diagnosis not present

## 2019-06-04 DIAGNOSIS — D2261 Melanocytic nevi of right upper limb, including shoulder: Secondary | ICD-10-CM | POA: Diagnosis not present

## 2019-06-04 DIAGNOSIS — D2271 Melanocytic nevi of right lower limb, including hip: Secondary | ICD-10-CM | POA: Diagnosis not present

## 2019-06-04 DIAGNOSIS — D2262 Melanocytic nevi of left upper limb, including shoulder: Secondary | ICD-10-CM | POA: Diagnosis not present

## 2019-06-04 DIAGNOSIS — D485 Neoplasm of uncertain behavior of skin: Secondary | ICD-10-CM | POA: Diagnosis not present

## 2019-06-04 DIAGNOSIS — L821 Other seborrheic keratosis: Secondary | ICD-10-CM | POA: Diagnosis not present

## 2019-06-04 DIAGNOSIS — D224 Melanocytic nevi of scalp and neck: Secondary | ICD-10-CM | POA: Diagnosis not present

## 2019-06-08 ENCOUNTER — Ambulatory Visit (INDEPENDENT_AMBULATORY_CARE_PROVIDER_SITE_OTHER): Payer: 59 | Admitting: Psychiatry

## 2019-06-08 ENCOUNTER — Other Ambulatory Visit: Payer: Self-pay

## 2019-06-08 DIAGNOSIS — F411 Generalized anxiety disorder: Secondary | ICD-10-CM | POA: Diagnosis not present

## 2019-06-08 NOTE — Progress Notes (Signed)
      [facility] Counselor/Therapist Progress Note  Patient ID: Beverly Gonzalez, MRN: [id number],    Date: 06/08/2019  Time Spent: 60 minutes 12:00noon to 1:00pm  Treatment Type: Individual Therapy  Reported Symptoms:  Anxiety, difficulty in relationship  Mental Status Exam:  Appearance:   Casual     Behavior:  Appropriate and Sharing  Motor:  Normal  Speech/Language:   Normal Rate  Affect:  Depressed and Tearful  Mood:  anxious, depressed and sad  Thought process:  normal  Thought content:    WNL  Sensory/Perceptual disturbances:    WNL  Orientation:  oriented to person, place, time/date, situation, day of week, month of year and year  Attention:  Good  Concentration:  Good  Memory:  WNL  Fund of knowledge:   Good  Insight:    Good  Judgment:   Good  Impulse Control:  Good   Risk Assessment: Danger to Self:  No Self-injurious Behavior: No Danger to Others: NoDuty to Warn:no Physical Aggression / Violence:No  Access to Firearms a concern: No  Gang Involvement:No   Subjective:  Patient involve in tension recently at home. (not all details in chart due to patient privacy needs.)  Interventions: Solution-Oriented/Positive Psychology and Ego-Supportive  Diagnosis:   ICD-10-CM   1. Generalized anxiety disorder  F41.1      Plan: Treatment goals may remain on tx plan as patient works on strategies, and progress will be documented each session in the "Progress" sectionof Plan.  Treatment Goals: Patient not signing tx plan on computer screen due to COVID-19.  Long Term Goal: Enhance ability to handle effectively the full variety of life's anxieties, as evidenced by patient reporting at least 5 days a week her anxiety is rated a 6 or less on a 1-10 scale for anxiety (with 10 being the highest). (Update---Patient gave birth to her baby on 04/08/2019.)  Short Term Goal: .Increase understanding of beliefs and messages that produce anxiety  and worry.  Strategy: Explore cognitive messages that mediate anxiety response and retrain in adaptive cognitions. Remain on her Zoloft unless advised differently by her OB-GYN. Will report progress or lack of progress in sessions.  PROGRESS: Patient today was added on for appt due to recent crisis within family.  Patient able to process her anxiety, sadness, fears, apprehension, and difficulty trusting.  Talked through what she plans to do going forward including making clear boundaries, communicating clearly, staying "in the present" and not going too far back nor jumping ahead.  Focus on care of her 2 young children.  Did feel at least some more supported and grounded at session end. (not all details shared in patient record due to her privacy needs.) No SI nor HI.  Goal review and progress noted with patient.   Next appt within 1-2 weeks.   [name], LCSW

## 2019-06-09 DIAGNOSIS — K5900 Constipation, unspecified: Secondary | ICD-10-CM | POA: Diagnosis not present

## 2019-06-09 DIAGNOSIS — M62838 Other muscle spasm: Secondary | ICD-10-CM | POA: Diagnosis not present

## 2019-06-09 DIAGNOSIS — N393 Stress incontinence (female) (male): Secondary | ICD-10-CM | POA: Diagnosis not present

## 2019-06-09 DIAGNOSIS — M6281 Muscle weakness (generalized): Secondary | ICD-10-CM | POA: Diagnosis not present

## 2019-06-16 ENCOUNTER — Ambulatory Visit (INDEPENDENT_AMBULATORY_CARE_PROVIDER_SITE_OTHER): Payer: 59 | Admitting: Psychiatry

## 2019-06-16 DIAGNOSIS — M62838 Other muscle spasm: Secondary | ICD-10-CM | POA: Diagnosis not present

## 2019-06-16 DIAGNOSIS — F411 Generalized anxiety disorder: Secondary | ICD-10-CM | POA: Diagnosis not present

## 2019-06-16 DIAGNOSIS — N393 Stress incontinence (female) (male): Secondary | ICD-10-CM | POA: Diagnosis not present

## 2019-06-16 DIAGNOSIS — M6281 Muscle weakness (generalized): Secondary | ICD-10-CM | POA: Diagnosis not present

## 2019-06-16 DIAGNOSIS — K5900 Constipation, unspecified: Secondary | ICD-10-CM | POA: Diagnosis not present

## 2019-06-16 NOTE — Progress Notes (Signed)
[facility] Counselor/Therapist Progress Note  Patient ID: Beverly Gonzalez, MRN: [id number],    Date: 06/16/2019  Time Spent: 60 minutes  10:00am to 11:00am  Virtual Visit Note Connected with patient by a video enabled telemedicine/telehealth application or telephone, with their informed consent, and verified patient privacy and that I am speaking with the correct person using two identifiers. I discussed the limitations, risks, security and privacy concerns of performing psychotherapy and management service by telephone and the availability of in person appointments. I also discussed with the patient that there may be a patient responsible charge related to this service. The patient expressed understanding and agreed to proceed. I discussed the treatment planning with the patient. The patient was provided an opportunity to ask questions and all were answered. The patient agreed with the plan and demonstrated an understanding of the instructions. The patient was advised to call  our office if  symptoms worsen or feel they are in a crisis state and need immediate contact.   Therapist Location: [facility] Patient Location: home   Treatment Type: Individual Therapy  Reported Symptoms: anxiety, frustrated, stressed  Mental Status Exam:  Appearance:   n/a   telehealth     Behavior:  Appropriate and Sharing  Motor:  n/a   telehealth  Speech/Language:   Normal Rate  Affect:  n/a   telehealth  Mood:  anxious  Thought process:  goal directed  Thought content:    WNL  Sensory/Perceptual disturbances:    WNL  Orientation:  oriented to person, place, time/date, situation, day of week, month of year and year  Attention:  Good  Concentration:  Good  Memory:  WNL  Fund of knowledge:   Good  Insight:    Good  Judgment:   Good  Impulse Control:  Good   Risk Assessment: Danger to Self:  No Self-injurious Behavior: No Danger to Others: No Duty to  Warn:no Physical Aggression / Violence:No  Access to Firearms a concern: No  Gang Involvement:No   Subjective: Patient anxious and stressed over current relationship issues and with her infant child having some continued medical concerns right now.    Interventions: Cognitive Behavioral Therapy and Solution-Oriented/Positive Psychology  Diagnosis:   ICD-10-CM   1. Generalized anxiety disorder  F41.1      Plan: Treatment goals may remain on tx plan as patient works on strategies, and progress will be documented each session in the "Progress" sectionof Plan.  Treatment Goals: Patient not signing tx plan on computer screen due to COVID-19.  Long Term Goal: Enhance ability to handle effectively the full variety of life's anxieties, as evidenced by patient reporting at least 5 days a week her anxiety is rated a 6 or less on a 1-10 scale for anxiety (with 10 being the highest). (Update---Patient gave birth to her baby on 04/08/2019, with medical problems continuing.)  Short Term Goal: .Increase understanding of beliefs and messages that produce anxiety and worry.  Strategy: Explore cognitive messages that mediate anxiety response and retrain in adaptive cognitions. Remain on her Zoloft unless advised differently by her OB-GYN. Will report progress or lack of progress in sessions.  PROGRESS: Patient upset and stressed today over continued interpersonal issues with ex-husband, other interpersonal relationships impacted by current stressors. (not all details shared due to patient privacy needs).  Today worked on Educational psychologist, self-care, and patient being able to share her concerns confidentially and get support. She is progressing, even in midst of challenging circumstances.  Encouraged positive  self-talk, less second-guessing herself.  Sister has been present some to help out in midst of this busy time.  SO still present. Emphasized self-calming strategies. Even  though there is still a good amount of stress and obstacles, patient is gaining strength, and able to self-affirm some which is helping also.  Still working on trust issues,  making clear boundaries, and staying in the present when needed. Goal review and challenged/progress noted with patient.    Next appt within 2 weeks.   [name], LCSW

## 2019-06-21 ENCOUNTER — Ambulatory Visit (INDEPENDENT_AMBULATORY_CARE_PROVIDER_SITE_OTHER): Payer: 59 | Admitting: Psychiatry

## 2019-06-21 DIAGNOSIS — F411 Generalized anxiety disorder: Secondary | ICD-10-CM | POA: Diagnosis not present

## 2019-06-21 NOTE — Progress Notes (Signed)
[facility] Counselor/Therapist Progress Note  Patient ID: Beverly Gonzalez, MRN: [id number],    Date: 06/21/2019  Time Spent:  60 minutes  1:00pm to 2:00pm  Virtual Visit Note Connected with patient by a video enabled telemedicine/telehealth application or telephone, with their informed consent, and verified patient privacy and that I am speaking with the correct person using two identifiers. I discussed the limitations, risks, security and privacy concerns of performing psychotherapy and management service by telephone and the availability of in person appointments. I also discussed with the patient that there may be a patient responsible charge related to this service. The patient expressed understanding and agreed to proceed. I discussed the treatment planning with the patient. The patient was provided an opportunity to ask questions and all were answered. The patient agreed with the plan and demonstrated an understanding of the instructions. The patient was advised to call  our office if  symptoms worsen or feel they are in a crisis state and need immediate contact.   Therapist Location: [facility] Patient Location: home  Treatment Type: Individual Therapy  Reported Symptoms: anxiety, frustration  Mental Status Exam:  Appearance:   n/a   telehealth     Behavior:  n/a   telehealth  Motor:  n/a  telehealth  Speech/Language:   Normal Rate  Affect:  n/a  telehealth  Mood:  anxious  Thought process:  normal  Thought content:    WNL  Sensory/Perceptual disturbances:    WNL  Orientation:  oriented to person, place, time/date, situation, day of week, month of year and year  Attention:  Good  Concentration:  Good  Memory:  WNL  Fund of knowledge:   Good  Insight:    Good  Judgment:   Good  Impulse Control:  Good   Risk Assessment: Danger to Self:  No Self-injurious Behavior: No Danger to Others: No Duty to Warn:no Physical Aggression / Violence:No   Access to Firearms a concern: No  Gang Involvement:No   Subjective: Patient in midst of relationship concerns and trying to manage some concerns with their young baby, although testing on their infant seems to be more positive at this point but is not completed.  Patient anxious, stressed,  and frustrated (with some situations/people).  Interventions: Solution-Oriented/Positive Psychology  Diagnosis:   ICD-10-CM   1. Generalized anxiety disorder  F41.1     Plan: Treatment goals may remain on tx plan as patient works on strategies, and progress will be documented each session in the "Progress" sectionof Plan.  Treatment Goals: Patient not signing tx plan on computer screen due to COVID-19.  Long Term Goal: Enhance ability to handle effectively the full variety of life's anxieties, as evidenced by patient reporting at least 5 days a week her anxiety is rated a 6 or less on a 1-10 scale for anxiety (with 10 being the highest). (Update---Patient gave birth to her baby on 04/08/2019, with medical problems continuing.)  Short Term Goal: .Increase understanding of beliefs and messages that produce anxiety and worry.  Strategy: Explore cognitive messages that mediate anxiety response and retrain in adaptive cognitions. Remain on her Zoloft unless advised differently by her OB-GYN. Will report progress or lack of progress in sessions.  PROGRESS: Patient today still in midst of relationship concerns and some pressure and involvement from other family members that is not helping right now.  Patient working hard to manage anxiety and stress, while making decisions for herself and 2 young children. She is faced  with challenging circumstances that comes with a lot of stress so she needed the session today to work on de-escalating emotionally, indecision, talking through issues between her and the father of her infant daughter, trying to communicate better with ex-husband,  confronting some concerns with a sister's involvement, as well as her own self-care.  Is definitely goal-directed as she focuses on her long term goal of being able to effectively manage a variety of life's anxieties. Having a safe and confidential place to talk through these issues is essential for patient at this point.  She is showing progress even under difficult circumstances.  Better able to think things through in more situations and I'm really encouraging her ability to believe in herself and be self-affirming.  Trying to remain "in the present" and make good decisions.  Goal review and progress/challenges/efforts noted with patient.   Next appt within  2 weeks.    [name], LCSW

## 2019-06-30 DIAGNOSIS — M6281 Muscle weakness (generalized): Secondary | ICD-10-CM | POA: Diagnosis not present

## 2019-06-30 DIAGNOSIS — N393 Stress incontinence (female) (male): Secondary | ICD-10-CM | POA: Diagnosis not present

## 2019-06-30 DIAGNOSIS — K5900 Constipation, unspecified: Secondary | ICD-10-CM | POA: Diagnosis not present

## 2019-06-30 DIAGNOSIS — M62838 Other muscle spasm: Secondary | ICD-10-CM | POA: Diagnosis not present

## 2019-07-09 ENCOUNTER — Ambulatory Visit (INDEPENDENT_AMBULATORY_CARE_PROVIDER_SITE_OTHER): Payer: 59 | Admitting: Psychiatry

## 2019-07-09 ENCOUNTER — Ambulatory Visit: Payer: 59 | Admitting: Psychiatry

## 2019-07-09 DIAGNOSIS — F411 Generalized anxiety disorder: Secondary | ICD-10-CM

## 2019-07-09 DIAGNOSIS — F419 Anxiety disorder, unspecified: Secondary | ICD-10-CM | POA: Diagnosis not present

## 2019-07-09 DIAGNOSIS — Z30431 Encounter for routine checking of intrauterine contraceptive device: Secondary | ICD-10-CM | POA: Diagnosis not present

## 2019-07-09 NOTE — Progress Notes (Signed)
[facility] Counselor/Therapist Progress Note  Patient ID: Beverly Gonzalez, MRN: [id number],    Date: 07/09/2019  Time Spent: 60 minutes 1:00pm to 2:00pm   Virtual Visit Note Connected with patient by a video enabled telemedicine/telehealth application or telephone, with their informed consent, and verified patient privacy and that I am speaking with the correct person using two identifiers. I discussed the limitations, risks, security and privacy concerns of performing psychotherapy and management service by telephone and the availability of in person appointments. I also discussed with the patient that there may be a patient responsible charge related to this service. The patient expressed understanding and agreed to proceed. I discussed the treatment planning with the patient. The patient was provided an opportunity to ask questions and all were answered. The patient agreed with the plan and demonstrated an understanding of the instructions. The patient was advised to call  our office if  symptoms worsen or feel they are in a crisis state and need immediate contact.   Therapist Location: [facility] Patient Location: home  Treatment Type: Individual Therapy  Reported Symptoms: anxiety, frustration, concerns with ex-husband and other family situations  Mental Status Exam:  Appearance:   n/a  telehealth     Behavior:  Sharing  Motor:  n/a  telehealth  Speech/Language:   Normal Rate  Affect:  n/a  telehealth  Mood:  anxious  Thought process:  normal  Thought content:    WNL  Sensory/Perceptual disturbances:    WNL  Orientation:  oriented to person, place, time/date, situation, day of week, month of year and year  Attention:  Good  Concentration:  Good  Memory:  WNL  Fund of knowledge:   Good  Insight:    Good  Judgment:   Good  Impulse Control:  Good   Risk Assessment: Danger to Self:  No Self-injurious Behavior: No Danger to Others: No Duty to  Warn:no Physical Aggression / Violence:No  Access to Firearms a concern: No  Gang Involvement:No   Subjective: Patient today anxious and frustrated with family-related situations involving multiple people.    Interventions: Solution-Oriented/Positive Psychology and Ego-Supportive  Diagnosis:   ICD-10-CM   1. Generalized anxiety disorder  F41.1      Plan: Treatment goals may remain on tx plan as patient works on strategies, and progress will be documented each session in the "Progress" sectionof Plan.  Treatment Goals: Patient not signing tx plan on computer screen due to COVID-19.  Long Term Goal: Enhance ability to handle effectively the full variety of life's anxieties, as evidenced by patient reporting at least 5 days a week her anxiety is rated a 6 or less on a 1-10 scale for anxiety (with 10 being the highest). (Update---Patient gave birth to her baby on 04/08/2019, with medical problems continuing.)  Short Term Goal: .Increase understanding of beliefs and messages that produce anxiety and worry.  Strategy: Explore cognitive messages that mediate anxiety response and retrain in adaptive cognitions. Remain on her Zoloft unless advised differently by her OB-GYN. Will report progress or lack of progress in sessions.  PROGRESS: Patient is remaining goal-focused. Several issues going on within family and extended family.  Patient concerned and is trying to practice modulating emotions more and asking for clarification when communication problems arise. Was quite anxious at the onset of our call, anticipating being able to talk through the situations she is dealing with in her family.  Focused in session more on (strategy above) identifying cognitive beliefs and self  messages that tend to aggravate anxiousness, and worked on more adaptive cognitions that do not support anxiety, but rather, helps in being more grounded even during anxious times and conversations,  which has been an issues recently that patient has needed to confront. Reviewed communication skills from a prior session, especially active listening skills and our tone in speaking as it relates to "how we say what we say".  Patient responded well to this and understands that it will take practice and intentionality, and actually practiced this before session ended.  Was less anxious and grounded at end of session. Reminded to "stay in the present verus the past or future." Goal review and progress/challenges noted with patient.   Next appt within 2 weeks.   [name], LCSW

## 2019-07-23 ENCOUNTER — Ambulatory Visit (INDEPENDENT_AMBULATORY_CARE_PROVIDER_SITE_OTHER): Payer: 59 | Admitting: Psychiatry

## 2019-07-23 DIAGNOSIS — F411 Generalized anxiety disorder: Secondary | ICD-10-CM

## 2019-07-23 NOTE — Progress Notes (Signed)
[facility] Counselor/Therapist Progress Note  Patient ID: Beverly Gonzalez, MRN: [id number],    Date: 07/23/2019  Time Spent: 60 minutes  10:00am to 11:00am  Virtual Visit Note Connected with patient by a video enabled telemedicine/telehealth application or telephone, with their informed consent, and verified patient privacy and that I am speaking with the correct person using two identifiers. I discussed the limitations, risks, security and privacy concerns of performing psychotherapy and management service by telephone and the availability of in person appointments. I also discussed with the patient that there may be a patient responsible charge related to this service. The patient expressed understanding and agreed to proceed. I discussed the treatment planning with the patient. The patient was provided an opportunity to ask questions and all were answered. The patient agreed with the plan and demonstrated an understanding of the instructions. The patient was advised to call  our office if  symptoms worsen or feel they are in a crisis state and need immediate contact.   Therapist Location: [facility] Patient Location: home   Treatment Type: Individual Therapy  Reported Symptoms:  Anxiety, family concerns  Mental Status Exam:  Appearance:   n/a  telehealth     Behavior:  Sharing and Motivated  Motor:  Normal  Speech/Language:   Normal Rate  Affect:  n/a  telehealth  Mood:  anxious  Thought process:  normal  Thought content:    WNL  Sensory/Perceptual disturbances:    WNL  Orientation:  oriented to person, place, time/date, situation, day of week, month of year and year  Attention:  Good  Concentration:  Good  Memory:  WNL  Fund of knowledge:   Good  Insight:    Good  Judgment:   Good  Impulse Control:  Good   Risk Assessment: Danger to Self:  No Self-injurious Behavior: No Danger to Others: No Duty to Warn:no Physical Aggression / Violence:No   Access to Firearms a concern: No  Gang Involvement:No   Subjective:  Patient reports very stressful past couple of weeks.  Had court re: custody issues of young son.  Court ended for week and they are awaiting a verdict from judge over the next 2-3 weeks.  (Court was handled virtually." )  Interventions: Cognitive Behavioral Therapy and Solution-Oriented/Positive Psychology  Diagnosis:   ICD-10-CM   1. Generalized anxiety disorder  F41.1     Plan: Treatment goals may remain on tx plan as patient works on strategies, and progress will be documented each session in the "Progress" sectionof Plan.  Treatment Goals: Patient not signing tx plan on computer screen due to COVID-19.  Long Term Goal: Enhance ability to handle effectively the full variety of life's anxieties, as evidenced by patient reporting at least 5 days a week her anxiety is rated a 6 or less on a 1-10 scale for anxiety (with 10 being the highest). (Update---Patient gave birth to her baby on 04/08/2019, with medical problems continuing.)  Short Term Goal: .Increase understanding of beliefs and messages that produce anxiety and worry.  Strategy: Explore cognitive messages that mediate anxiety response and retrain in adaptive cognitions. Remain on her Zoloft unless advised differently by her OB-GYN. Will report progress or lack of progress in sessions.  PROGRESS: Patient continuing progress on her goals and reporting today that recent situation surrounding custody issues and court has produced "a lot of anxiety and stressful times."  At times she seems more anxious and at other times is more settled and realizes "I've  done all I can do and am just waiting to hear the decision of the judge."  Still some anxiety re: family and S.O. issues, 4yr old son who is having a lot of separation anxiety, and is" happy to be back at work and is doing well there."  She is hopeful to get appt soon at [place] for  her, her young son and son's father to help work collaboratively on son's separation issues from mom. She is working to stay focused, trying to modulate emotions and be more calm in the moment, and patient in the midst of uncertainties. Reviewed cognitive messages that lead to more anxiety and worked on being able to identify them more quickly so as to replace them more quickly with adaptive cognitions that do not support anxiety, and do support more calmness and a sense of being more grounded emotionally, especially in stressful situations. Striving to have more mindfulness and intentionality in her communication. Continue practicing "staying in the present."  Goal review and progress noted with patient.   Next appt within 2-3 weeks.   [name], LCSW

## 2019-08-03 DIAGNOSIS — S0501XA Injury of conjunctiva and corneal abrasion without foreign body, right eye, initial encounter: Secondary | ICD-10-CM | POA: Diagnosis not present

## 2019-08-03 DIAGNOSIS — S0500XA Injury of conjunctiva and corneal abrasion without foreign body, unspecified eye, initial encounter: Secondary | ICD-10-CM | POA: Diagnosis not present

## 2019-08-20 ENCOUNTER — Other Ambulatory Visit: Payer: Self-pay

## 2019-08-20 ENCOUNTER — Ambulatory Visit (INDEPENDENT_AMBULATORY_CARE_PROVIDER_SITE_OTHER): Payer: 59 | Admitting: Psychiatry

## 2019-08-20 DIAGNOSIS — F411 Generalized anxiety disorder: Secondary | ICD-10-CM | POA: Diagnosis not present

## 2019-08-20 NOTE — Progress Notes (Signed)
[facility] Counselor/Therapist Progress Note  Patient ID: Beverly Gonzalez, MRN: [id number],    Date: 08/20/2019  Time Spent: 60 minutes   9:00am to 10:00am  Treatment Type: Individual Therapy  Reported Symptoms: anxiety, frustration, sadness, fatigue  Mental Status Exam:  Appearance:   Casual     Behavior:  Appropriate, Sharing and Motivated  Motor:  Normal  Speech/Language:   Clear and Coherent  Affect:  Depressed and anxious, some tearfulness  Mood:  anxious, depressed and sad  Thought process:  goal directed  Thought content:    WNL  Sensory/Perceptual disturbances:    WNL  Orientation:  oriented to person, place, time/date, situation, day of week, month of year and year  Attention:  Good  Concentration:  Good  Memory:  WNL  Fund of knowledge:   Good  Insight:    Good  Judgment:   Good  Impulse Control:  Good   Risk Assessment: Danger to Self:  No Self-injurious Behavior: No Danger to Others: No Duty to Warn:no Physical Aggression / Violence:No  Access to Firearms a concern: No  Gang Involvement:No   Subjective: Patient not pleased with outcome of court recently.  Anxious, stressed, sad, frustrated. To work hard on staying in the present and focusing on her needs and the needs of her young children, knowing that next court hearing will be in July where changes may be made at that time.  Interventions: Cognitive Behavioral Therapy and Solution-Oriented/Positive Psychology  Diagnosis:   ICD-10-CM   1. Generalized anxiety disorder  F41.1      Plan: Treatment goals may remain on tx plan as patient works on strategies, and progress will be documented each session in the "Progress" sectionof Plan.  Treatment Goals: Patient not signing tx plan on computer screen due to COVID-19.  Long Term Goal: Enhance ability to handle effectively the full variety of life's anxieties, as evidenced by patient reporting at least 5 days a week her anxiety is  rated a 6 or less on a 1-10 scale for anxiety (with 10 being the highest). (Update---Patient gave birth to her baby on 04/08/2019, with medical problems continuing.)  Short Term Goal: .Increase understanding of beliefs and messages that produce anxiety and worry.  Strategy: Explore cognitive messages that mediate anxiety response and retrain in adaptive cognitions. Remain on her Zoloft unless advised differently by her OB-GYN. Will report progress or lack of progress in sessions.  PROGRESS: Patient today anxious and frustrated.  Recent court did not go in favorable way for patient.  Processed this with patient today as this was unexpected for her. Also hit by the genetic testing that came back with a "positive" result for her infant daughter. Not eating as well which she shared can affect her milk and breastfeeding her baby. States that under stress, she does not usually eat as well.  Commits to eat healthier starting now. Is working 4 days a week and that is going relatively well.  Stressors with former husband, and current S.O. Shares that she and S.O. are planning some kayaking and painting this weekend which will be helpful for patient physically and emotionally.  Self-care (physical and emotional) emphasized with patient and she did seem more grounded and proactive by session end. Is following through with some court-recommended eval and services for her, young son, and father of son.  Goal review and progress noted with patient.  Next appt within 2 weeks.   [name], LCSW

## 2019-09-03 ENCOUNTER — Ambulatory Visit (INDEPENDENT_AMBULATORY_CARE_PROVIDER_SITE_OTHER): Payer: 59 | Admitting: Psychiatry

## 2019-09-03 DIAGNOSIS — F411 Generalized anxiety disorder: Secondary | ICD-10-CM

## 2019-09-03 NOTE — Progress Notes (Signed)
      [facility] Counselor/Therapist Progress Note  Patient ID: Beverly Gonzalez, MRN: [id number],    Date: 09/03/2019  Time Spent: 60 minutes 10:00am to 11:00am  Treatment Type: Individual Therapy  Reported Symptoms: anxiety, frustration, aggravation  Mental Status Exam:  Appearance:   Casual     Behavior:  Appropriate, Sharing and Motivated  Motor:  Normal  Speech/Language:   Normal Rate  Affect:  anxiety, frustration, aggravation  Mood:  anxious  Thought process:  normal  Thought content:    WNL  Sensory/Perceptual disturbances:    WNL  Orientation:  oriented to person, place, time/date, situation, day of week, month of year and year  Attention:  Good  Concentration:  Good  Memory:  WNL  Fund of knowledge:   Good  Insight:    Good  Judgment:   Good  Impulse Control:  Good   Risk Assessment: Danger to Self:  No Self-injurious Behavior: No Danger to Others: No Duty to Warn:no Physical Aggression / Violence:No  Access to Firearms a concern: No  Gang Involvement:No   Subjective: Patient in today with anxiety, frustration, and aggravation.  Personal and family issues involved.  Young infant is continuing to show improvements.    Interventions: Cognitive Behavioral Therapy and Solution-Oriented/Positive Psychology  Diagnosis:   ICD-10-CM   1. Generalized anxiety disorder  F41.1      Plan: Treatment goals may remain on tx plan as patient works on strategies, and progress will be documented each session in the "Progress" sectionof Plan.  Treatment Goals: Patient not signing tx plan on computer screen due to COVID-19.  Long Term Goal: Enhance ability to handle effectively the full variety of life's anxieties, as evidenced by patient reporting at least 5 days a week her anxiety is rated a 6 or less on a 1-10 scale for anxiety (with 10 being the highest). (Update---Patient gave birth to her baby on 04/08/2019, with medical problems  continuing.)  Short Term Goal: .Increase understanding of beliefs and messages that produce anxiety and worry.  Strategy: Explore cognitive messages that mediate anxiety response and retrain in adaptive cognitions. Remain on her Zoloft unless advised differently by her OB-GYN. Will report progress or lack of progress in sessions.  PROGRESS: Patient in today with anxiety, frustrations, aggravations.  Some difficulties in family relationships/communication. Priorities on her and baby's dad's time. Emotional and uncertain about future as clearly both she and baby's dad have different thoughts and intentions about "family time" and time spent in other activities.  Patient feeling some aloneness in situation.  She and baby's dad have started marital tx recently which I really encouraged. Work is still going well, nutrition better. Still trying to manage stressorw with former husband and father of first child who is now almost 35 yrs old. Patient continues working on understanding beliefs and behaviors that tend to produce more anxiety for her, and working to change those beliefs/behaviors to more positive beliefs/behaviors that will benefit her in daily life especially within the family. Self-care continues to be encouraged for patient (emotionally and physically).  Goal review and progress/challenges noted with patient.  Next appt within 2 weeks.   Beverly Ace, LCSW

## 2019-09-22 ENCOUNTER — Other Ambulatory Visit: Payer: Self-pay

## 2019-09-22 ENCOUNTER — Ambulatory Visit (INDEPENDENT_AMBULATORY_CARE_PROVIDER_SITE_OTHER): Payer: 59 | Admitting: Internal Medicine

## 2019-09-22 ENCOUNTER — Encounter: Payer: Self-pay | Admitting: Internal Medicine

## 2019-09-22 VITALS — BP 102/68 | HR 63 | Temp 97.0°F | Wt 147.5 lb

## 2019-09-22 DIAGNOSIS — I808 Phlebitis and thrombophlebitis of other sites: Secondary | ICD-10-CM | POA: Diagnosis not present

## 2019-09-22 NOTE — Progress Notes (Signed)
Established Patient Office Visit     This visit occurred during the SARS-CoV-2 public health emergency.  Safety protocols were in place, including screening questions prior to the visit, additional usage of staff PPE, and extensive cleaning of exam room while observing appropriate contact time as indicated for disinfecting solutions.    CC/Reason for Visit: Pain and discoloration of right upper arm  HPI: Beverly Gonzalez is a 34 y.o. female who is coming in today for the above mentioned reasons.  This morning when she woke up she noticed that her right arm felt tight and also noticed a purple line going from her elbow up into her armpit.  She had blood draws in her right arm last week.  Past Medical/Surgical History: Past Medical History:  Diagnosis Date  . Anxiety   . Severe preeclampsia, third trimester 11/18/2016  . Vaginal Pap smear, abnormal     Past Surgical History:  Procedure Laterality Date  . CESAREAN SECTION N/A 11/20/2016   Procedure: CESAREAN SECTION;  Surgeon: Janyth Pupa, DO;  Location: [facility];  Service: Obstetrics;  Laterality: N/A;  . COLONOSCOPY    . TONSILLECTOMY    . WISDOM TOOTH EXTRACTION      Social History:  reports that she has never smoked. She has never used smokeless tobacco. She reports that she does not drink alcohol or use drugs.  Allergies: No Known Allergies  Family History:  Family History  Problem Relation Age of Onset  . Hypertension Mother   . Diabetes Father   . Hypertension Father   . Heart disease Maternal Grandmother   . Heart disease Maternal Grandfather   . Other Maternal Grandfather        covid  . Diabetes Paternal Grandfather   . Heart disease Paternal Grandfather   . Stroke Paternal Grandfather   . Heart attack Paternal Grandfather      Current Outpatient Medications:  .  ibuprofen (ADVIL) 600 MG tablet, Take 1 tablet (600 mg total) by mouth every 6 (six) hours., Disp: 30 tablet, Rfl: 0 .   Prenatal Vit-Fe Fumarate-FA (PRENATAL PO), Take 1 tablet by mouth daily., Disp: , Rfl:  .  sertraline (ZOLOFT) 25 MG tablet, Take 25 mg by mouth daily., Disp: , Rfl:  .  VITAMIN D PO, Take by mouth., Disp: , Rfl:   Review of Systems:  Constitutional: Denies fever, chills, diaphoresis, appetite change and fatigue.  HEENT: Denies photophobia, eye pain, redness, hearing loss, ear pain, congestion, sore throat, rhinorrhea, sneezing, mouth sores, trouble swallowing, neck pain, neck stiffness and tinnitus.   Respiratory: Denies SOB, DOE, cough, chest tightness,  and wheezing.   Cardiovascular: Denies chest pain, palpitations and leg swelling.  Gastrointestinal: Denies nausea, vomiting, abdominal pain, diarrhea, constipation, blood in stool and abdominal distention.  Genitourinary: Denies dysuria, urgency, frequency, hematuria, flank pain and difficulty urinating.  Endocrine: Denies: hot or cold intolerance, sweats, changes in hair or nails, polyuria, polydipsia. Musculoskeletal: Denies myalgias, back pain, joint swelling, arthralgias and gait problem.  Skin: Denies pallor, rash and wound.  Neurological: Denies dizziness, seizures, syncope, weakness, light-headedness, numbness and headaches.  Hematological: Denies adenopathy. Easy bruising, personal or family bleeding history  Psychiatric/Behavioral: Denies suicidal ideation, mood changes, confusion, nervousness, sleep disturbance and agitation    Physical Exam: Vitals:   09/22/19 0916  BP: 102/68  Pulse: 63  Temp: (!) 97 F (36.1 C)  TempSrc: Temporal  SpO2: 97%  Weight: 147 lb 8 oz (66.9 kg)    Body  mass index is 21.16 kg/m.   Constitutional: NAD, calm, comfortable Eyes: PERRL, lids and conjunctivae normal ENMT: Mucous membranes are moist.  Skin: See pictures of right arm below:          Impression and Plan:  Thrombophlebitis of right arm -This is superficial thrombophlebitis likely precipitated by blood draw last  week. -Have advised icing, NSAIDs, rest as much as possible. -She will notify me if increasing redness or pain in which case a course of antibiotics may be helpful. -Do not believe she needs an upper extremity Doppler at this time.     Lelon Frohlich, MD [facility]

## 2019-09-24 ENCOUNTER — Ambulatory Visit (INDEPENDENT_AMBULATORY_CARE_PROVIDER_SITE_OTHER): Payer: 59 | Admitting: Psychiatry

## 2019-09-24 DIAGNOSIS — F411 Generalized anxiety disorder: Secondary | ICD-10-CM | POA: Diagnosis not present

## 2019-09-24 NOTE — Progress Notes (Signed)
[facility] Counselor/Therapist Progress Note  Patient ID: Beverly Gonzalez, MRN: [id number],    Date: 09/24/2019  Time Spent: 60 minutes 9:00am to 10:00am   Virtual Visit Note Connected with patient by a video enabled telemedicine/telehealth application or telephone, with their informed consent, and verified patient privacy and that I am speaking with the correct person using two identifiers. I discussed the limitations, risks, security and privacy concerns of performing psychotherapy and management service by telephone and the availability of in person appointments. I also discussed with the patient that there may be a patient responsible charge related to this service. The patient expressed understanding and agreed to proceed. I discussed the treatment planning with the patient. The patient was provided an opportunity to ask questions and all were answered. The patient agreed with the plan and demonstrated an understanding of the instructions. The patient was advised to call  our office if  symptoms worsen or feel they are in a crisis state and need immediate contact.   Therapist Location: [facility] Patient Location: home  Treatment Type: Individual Therapy  Reported Symptoms: anxiety, stress, relationship concerns  Mental Status Exam:  Appearance:   n/a  telehealth     Behavior:  Appropriate, Sharing and Motivated  Motor:  n/a  telehealth  Speech/Language:   Clear and Coherent  Affect:  n/a  telehealth  Mood:  anxious  Thought process:  normal  Thought content:    WNL  Sensory/Perceptual disturbances:    WNL  Orientation:  oriented to person, place, time/date, situation, day of week, month of year and year  Attention:  Good  Concentration:  Good  Memory:  WNL  Fund of knowledge:   Good  Insight:    Good  Judgment:   Good  Impulse Control:  Good   Risk Assessment: Danger to Self:  No Self-injurious Behavior: No Danger to Others: No Duty to  Warn:no Physical Aggression / Violence:No  Access to Firearms a concern: No  Gang Involvement:No   Subjective: Patient today very covered-up caring for a young toddler son and an infant baby.  Anxiety, communication issues with S.O. at home. Uncertainty in relationship with S.O.   Interventions: Cognitive Behavioral Therapy and Ego-Supportive  Diagnosis:   ICD-10-CM   1. Generalized anxiety disorder  F41.1     Plan: Treatment goals may remain on tx plan as patient works on strategies, and progress will be documented each session in the "Progress" sectionof Plan.  Treatment Goals: Patient not signing tx plan on computer screen due to COVID-19.  Long Term Goal: Enhance ability to handle effectively the full variety of life's anxieties, as evidenced by patient reporting at least 5 days a week her anxiety is rated a 6 or less on a 1-10 scale for anxiety (with 10 being the highest). (Update---Patient gave birth to her baby on 04/08/2019, with medical problems continuing.)  Short Term Goal: .Increase understanding of beliefs and messages that produce anxiety and worry.  Strategy: Explore cognitive messages that mediate anxiety response and retrain in adaptive cognitions. Remain on her Zoloft unless advised differently by her OB-GYN. Will report progress or lack of progress in sessions.  PROGRESS: Patient today experiencing the stressors of caring for her toddler son and infant daughter.  Most care of them is up to her as there is continued significant stress between her and S.O., who is father of infant baby. Lots of stress, communication issues, difficulty co-parenting due to relationship problems. Patient needed session today to  vent a lot of feelings mostly of frustration, and also of disappointment, anxiety, some anger, and anxiousness, all of which she talked through today. Looking at some important issues and options for herself, moving forward and because of  circumstances is trying to focus more intently on "what she can control versus what she can't" and "staying in the present without jumping too far ahead.  Reviewed some long-held beliefs that tend to produce more anxiety and working to recognize them more quickly and work to change them to be more positive so as to not support anxiety. Is to be on a trip this weekend at the [place] with her 2 sisters and both of her young children.  Goal review and progress/challenges noted with patient.  Next appt within 2 weeks.   [name], LCSW

## 2019-09-30 ENCOUNTER — Other Ambulatory Visit: Payer: Self-pay

## 2019-10-01 ENCOUNTER — Encounter: Payer: Self-pay | Admitting: Internal Medicine

## 2019-10-01 ENCOUNTER — Ambulatory Visit (INDEPENDENT_AMBULATORY_CARE_PROVIDER_SITE_OTHER): Payer: 59 | Admitting: Internal Medicine

## 2019-10-01 ENCOUNTER — Encounter: Payer: 59 | Admitting: Internal Medicine

## 2019-10-01 VITALS — BP 98/68 | HR 78 | Temp 97.5°F | Ht 69.5 in | Wt 146.9 lb

## 2019-10-01 DIAGNOSIS — Z0000 Encounter for general adult medical examination without abnormal findings: Secondary | ICD-10-CM | POA: Diagnosis not present

## 2019-10-01 DIAGNOSIS — F411 Generalized anxiety disorder: Secondary | ICD-10-CM

## 2019-10-01 MED ORDER — SERTRALINE HCL 50 MG PO TABS: 50.0000 mg | ORAL_TABLET | Freq: Every day | ORAL | 1 refills | Status: DC

## 2019-10-01 NOTE — Progress Notes (Signed)
Established Patient Office Visit     This visit occurred during the SARS-CoV-2 public health emergency.  Safety protocols were in place, including screening questions prior to the visit, additional usage of staff PPE, and extensive cleaning of exam room while observing appropriate contact time as indicated for disinfecting solutions.    CC/Reason for Visit: Annual preventive exam  HPI: Beverly Gonzalez is a 34 y.o. female who is coming in today for the above mentioned reasons. Past Medical History is significant for: Generalized anxiety disorder on Zoloft 50 mg daily that she has been tolerating well, she also attends CBT sessions routinely.  Since I saw her last year, she has had a daughter.  Her daughter had some difficulties during her early life and ended up being diagnosed as some kind of hyperreflexia/movement disorder, after seeing a geneticist it is thought that she is a carrier for this disease.  She has routine eye and dental care.  She has received both Covid vaccines, she follows with GYN routinely.   Past Medical/Surgical History: Past Medical History:  Diagnosis Date  . Anxiety   . Severe preeclampsia, third trimester 11/18/2016  . Vaginal Pap smear, abnormal     Past Surgical History:  Procedure Laterality Date  . CESAREAN SECTION N/A 11/20/2016   Procedure: CESAREAN SECTION;  Surgeon: Janyth Pupa, DO;  Location: [place];  Service: Obstetrics;  Laterality: N/A;  . COLONOSCOPY    . TONSILLECTOMY    . WISDOM TOOTH EXTRACTION      Social History:  reports that she has never smoked. She has never used smokeless tobacco. She reports that she does not drink alcohol or use drugs.  Allergies: No Known Allergies  Family History:  Family History  Problem Relation Age of Onset  . Hypertension Mother   . Diabetes Father   . Hypertension Father   . Heart disease Maternal Grandmother   . Heart disease Maternal Grandfather   . Other Maternal  Grandfather        covid  . Diabetes Paternal Grandfather   . Heart disease Paternal Grandfather   . Stroke Paternal Grandfather   . Heart attack Paternal Grandfather      Current Outpatient Medications:  .  cetirizine (ZYRTEC) 10 MG tablet, Take 10 mg by mouth daily., Disp: , Rfl:  .  fluticasone (FLONASE) 50 MCG/ACT nasal spray, Place into both nostrils daily., Disp: , Rfl:  .  ibuprofen (ADVIL) 600 MG tablet, Take 1 tablet (600 mg total) by mouth every 6 (six) hours., Disp: 30 tablet, Rfl: 0 .  Prenatal Vit-Fe Fumarate-FA (PRENATAL PO), Take 1 tablet by mouth daily., Disp: , Rfl:  .  VITAMIN D PO, Take by mouth., Disp: , Rfl:  .  sertraline (ZOLOFT) 50 MG tablet, Take 1 tablet (50 mg total) by mouth daily., Disp: 90 tablet, Rfl: 1  Review of Systems:  Constitutional: Denies fever, chills, diaphoresis, appetite change and fatigue.  HEENT: Denies photophobia, eye pain, redness, hearing loss, ear pain, congestion, sore throat, rhinorrhea, sneezing, mouth sores, trouble swallowing, neck pain, neck stiffness and tinnitus.   Respiratory: Denies SOB, DOE, cough, chest tightness,  and wheezing.   Cardiovascular: Denies chest pain, palpitations and leg swelling.  Gastrointestinal: Denies nausea, vomiting, abdominal pain, diarrhea, constipation, blood in stool and abdominal distention.  Genitourinary: Denies dysuria, urgency, frequency, hematuria, flank pain and difficulty urinating.  Endocrine: Denies: hot or cold intolerance, sweats, changes in hair or nails, polyuria, polydipsia. Musculoskeletal: Denies myalgias, back pain,  joint swelling, arthralgias and gait problem.  Skin: Denies pallor, rash and wound.  Neurological: Denies dizziness, seizures, syncope, weakness, light-headedness, numbness and headaches.  Hematological: Denies adenopathy. Easy bruising, personal or family bleeding history  Psychiatric/Behavioral: Denies suicidal ideation, mood changes, confusion, nervousness, sleep  disturbance and agitation    Physical Exam: Vitals:   10/01/19 0934  BP: 98/68  Pulse: 78  Temp: (!) 97.5 F (36.4 C)  TempSrc: Temporal  SpO2: 99%  Weight: 146 lb 14.4 oz (66.6 kg)  Height: 5' 9.5" (1.765 m)    Body mass index is 21.38 kg/m.   Constitutional: NAD, calm, comfortable Eyes: PERRL, lids and conjunctivae normal ENMT: Mucous membranes are moist. Tympanic membrane is pearly white, no erythema or bulging. Neck: normal, supple, no masses, no thyromegaly Respiratory: clear to auscultation bilaterally, no wheezing, no crackles. Normal respiratory effort. No accessory muscle use.  Cardiovascular: Regular rate and rhythm, no murmurs / rubs / gallops. No extremity edema.  Abdomen: no tenderness, no masses palpated. No hepatosplenomegaly. Bowel sounds positive.  Musculoskeletal: no clubbing / cyanosis. No joint deformity upper and lower extremities. Good ROM, no contractures. Normal muscle tone.  Skin: no rashes, lesions, ulcers. No induration Neurologic: Grossly intact and nonfocal Psychiatric: Normal judgment and insight. Alert and oriented x 3. Normal mood.    Impression and Plan:  Encounter for preventive health examination -She has routine eye and dental care. -Vaccinations are up-to-date and age-appropriate. -She had screening labs during doctors day vaccinations, she will forward results to be 1 available. -Healthy lifestyle discussed. -Commence routine breast cancer screening at age 34 and colon cancer screening age 53, she follows routinely with GYN for cervical cancer screening.  Generalized anxiety disorder -   Office Visit from 10/01/2019 in [place] at [place]  PHQ-9 Total Score  0     -Refill Zoloft, continue CBT sessions.    Lelon Frohlich, MD [facility]

## 2019-10-15 ENCOUNTER — Other Ambulatory Visit: Payer: Self-pay

## 2019-10-15 ENCOUNTER — Ambulatory Visit (INDEPENDENT_AMBULATORY_CARE_PROVIDER_SITE_OTHER): Payer: 59 | Admitting: Psychiatry

## 2019-10-15 DIAGNOSIS — F411 Generalized anxiety disorder: Secondary | ICD-10-CM | POA: Diagnosis not present

## 2019-10-15 NOTE — Progress Notes (Signed)
      [facility] Counselor/Therapist Progress Note  Patient ID: Beverly Gonzalez, MRN: [id number],    Date: 10/15/2019  Time Spent: 60 minutes  9:00am to 10:00am  Treatment Type: Individual Therapy  Reported Symptoms: anxiety, frustratiions, fatigue, sadness  Mental Status Exam:  Appearance:   Casual     Behavior:  Appropriate, Sharing and Motivated  Motor:  Normal  Speech/Language:   Clear and Coherent  Affect:  anxious  Mood:  anxious  Thought process:  goal directed  Thought content:    WNL  Sensory/Perceptual disturbances:    WNL  Orientation:  oriented to person, place, time/date, situation, day of week, month of year and year  Attention:  Good  Concentration:  Good  Memory:  WNL  Fund of knowledge:   Good  Insight:    Good  Judgment:   Good  Impulse Control:  Good   Risk Assessment: Danger to Self:  No Self-injurious Behavior: No Danger to Others: No Duty to Warn:no Physical Aggression / Violence:No  Access to Firearms a concern: No  Gang Involvement:No   Subjective: Patient in today reporting anxiety, frustrations, and sadness about family dynamics and situations.  Interventions: Cognitive Behavioral Therapy and Solution-Oriented/Positive Psychology  Diagnosis:   ICD-10-CM   1. Generalized anxiety disorder  F41.1      Plan: Treatment goals may remain on tx plan as patient works on strategies, and progress will be documented each session in the "Progress" sectionof Plan.  Treatment Goals: Patient not signing tx plan on computer screen due to COVID-19.  Long Term Goal: Enhance ability to handle effectively the full variety of life's anxieties, as evidenced by patient reporting at least 5 days a week her anxiety is rated a 6 or less on a 1-10 scale for anxiety (with 10 being the highest). (Update---Patient gave birth to her baby on 04/08/2019, with medical problems continuing.)  Short Term Goal: .Increase understanding of  beliefs and messages that produce anxiety and worry.  Strategy: Explore cognitive messages that mediate anxiety response and retrain in adaptive cognitions. Remain on her Zoloft unless advised differently by her OB-GYN. Will report progress or lack of progress in sessions.  PROGRESS: Patient in today feeling anxious, stressed, frustrated and sad.  Family struggles continue and still lots of distance and uncertainties in relationships.  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  (Not all info included in note due to patient privacy needs and her work in healthcare.)  Family and personal situation very stressful, with lots of communication and co-parenting issues.  Patient processes her anxiousness, sadness, frustrations and hopes during session today and seemed to feel more grounded by session end, although her situation is the same currently. To continue work on staying more in the present verus jumping too far ahead, focusing on what she can control versus what she can't control, and in getting support personally and at home when needed, setting better boundaries, and allowing herself to take "breaks".   Goal review and progress/challenges noted with patient.  Next appt within 2 weeks.   [name], LCSW

## 2019-10-29 ENCOUNTER — Other Ambulatory Visit: Payer: Self-pay

## 2019-10-29 ENCOUNTER — Ambulatory Visit (INDEPENDENT_AMBULATORY_CARE_PROVIDER_SITE_OTHER): Payer: 59 | Admitting: Psychiatry

## 2019-10-29 DIAGNOSIS — F411 Generalized anxiety disorder: Secondary | ICD-10-CM | POA: Diagnosis not present

## 2019-10-29 NOTE — Progress Notes (Signed)
[facility] Counselor/Therapist Progress Note  Patient ID: Beverly Gonzalez, MRN: [id number],    Date: 10/29/2019  Time Spent: 60 minutes  11:00am to 12:00pm  Treatment Type: Individual Therapy  Reported Symptoms:  Anxiety, frustrations   Mental Status Exam:  Appearance:   Casual     Behavior:  Appropriate, Sharing and Motivated  Motor:  Normal  Speech/Language:   Normal Rate  Affect:  anxious, frustrated  Mood:  anxious  Thought process:  goal directed  Thought content:    WNL  Sensory/Perceptual disturbances:    WNL  Orientation:  oriented to person, place, time/date, situation, day of week, month of year and year  Attention:  Good  Concentration:  Good  Memory:  WNL  Fund of knowledge:   Good  Insight:    Good  Judgment:   Good  Impulse Control:  Good   Risk Assessment: Danger to Self:  No Self-injurious Behavior: No Danger to Others: No Duty to Warn:no Physical Aggression / Violence:No  Access to Firearms a concern: No  Gang Involvement:No   Subjective: Patient today reports anxiety and frustrations, especially in trying to effectively co-parent with father of child in the midst of communication and relational difficulties.    Interventions: Cognitive Behavioral Therapy and Solution-Oriented/Positive Psychology  Diagnosis:   ICD-10-CM   1. Generalized anxiety disorder  F41.1     Plan: Treatment goals may remain on tx plan as patient works on strategies, and progress will be documented each session in the "Progress" sectionof Plan.  Treatment Goals: Patient not signing tx plan on computer screen due to COVID-19.  Long Term Goal: Enhance ability to handle effectively the full variety of life's anxieties, as evidenced by patient reporting at least 5 days a week her anxiety is rated a 6 or less on a 1-10 scale for anxiety (with 10 being the highest). (Update---Patient gave birth to her baby on 04/08/2019, with medical problems  continuing.)  Short Term Goal: .Increase understanding of beliefs and messages that produce anxiety and worry.  Strategy: Explore cognitive messages that mediate anxiety response and retrain in adaptive cognitions. Remain on her Zoloft unless advised differently by her OB-GYN. Will report progress or lack of progress in sessions.  PROGRESS: Patient in today reporting anxiety and frustrations with trying to more effectively coparent her young son with son's father, in relationship at home, and with work stressors.  Anxious thoughts increase at times of high stress and we worked on challenging those thoughts and replacing them with more reality-based and positive thought patterns that do not support anxiety. Some improvement in relationship at home and recently had a good trip to beach with the 2 young children.  Family and personal situations still having significant stress, but also some noted improvements. Today processes more of her frustrations and anxieties, while also being open to suggestions in better understanding and management of those frustrations and anxieties.  Setting some boundaries without being overly rigid as she has seen some improvement at home and with her young son. Attends therapy session with son and has encouraged the father of son to do the same, and father states he needs to limit it to 1 session per month. Patient progressing some on re-directing her focus more on the things she can change versus what she cannot change, trying to look more for "positives", staying in the present, asking for support when needed, using more encouraging self-talk.  Goal review and progress noted with patient.  Next appt  within 2 weeks.   [name], LCSW

## 2019-11-12 ENCOUNTER — Ambulatory Visit (INDEPENDENT_AMBULATORY_CARE_PROVIDER_SITE_OTHER): Payer: 59 | Admitting: Psychiatry

## 2019-11-12 ENCOUNTER — Other Ambulatory Visit: Payer: Self-pay

## 2019-11-12 DIAGNOSIS — F411 Generalized anxiety disorder: Secondary | ICD-10-CM | POA: Diagnosis not present

## 2019-11-12 NOTE — Progress Notes (Signed)
      [facility] Counselor/Therapist Progress Note  Patient ID: Beverly Gonzalez, MRN: [id number],    Date: 11/12/2019  Time Spent: 60 minutes  9:00am to 10:00am  Treatment Type: Individual Therapy  Reported Symptoms: anxiety, some frustration and difficulty coparenting with older child's father ; some sadness  Mental Status Exam:  Appearance:   Casual     Behavior:  Appropriate, Sharing and Motivated  Motor:  Normal  Speech/Language:   Normal Rate  Affect:  anxious  Mood:  anxious  Thought process:  normal  Thought content:    WNL  Sensory/Perceptual disturbances:    WNL  Orientation:  oriented to person, place, time/date, situation, day of week, month of year and year  Attention:  Good  Concentration:  Good  Memory:  WNL  Fund of knowledge:   Good  Insight:    Good  Judgment:   Good  Impulse Control:  Good   Risk Assessment: Danger to Self:  No Self-injurious Behavior: No Danger to Others: No Duty to Warn:no Physical Aggression / Violence:No  Access to Firearms a concern: No  Gang Involvement:No   Subjective: Patient today with anxiety, some sadness, and difficulty co-parenting with father of young son.  Her relationship with S.O. is improving some.  Interventions: Solution-Oriented/Positive Psychology and Ego-Supportive  Diagnosis:   ICD-10-CM   1. Generalized anxiety disorder  F41.1     Plan: Treatment goals may remain on tx plan as patient works on strategies, and progress will be documented each session in the "Progress" sectionof Plan.  Treatment Goals: Patient not signing tx plan on computer screen due to COVID-19.  Long Term Goal: Enhance ability to handle effectively the full variety of life's anxieties, as evidenced by patient reporting at least 5 days a week her anxiety is rated a 6 or less on a 1-10 scale for anxiety (with 10 being the highest). (Update---Patient gave birth to her baby on 04/08/2019, with medical problems  continuing.)  Short Term Goal: .Increase understanding of beliefs and messages that produce anxiety and worry.  Strategy: Explore cognitive messages that mediate anxiety response and retrain in adaptive cognitions. Remain on her Zoloft unless advised differently by her OB-GYN. Will report progress or lack of progress in sessions.  PROGRESS: Patient in today reporting anxiety, frustrations, and sadness, all of which have increased some due to circumstances with sister and with father of her young son. Processed at length today the issues revolving around her 34 yr old son, his dad, and patient. Patient vented her frustrations, anger, and sadness about how some things have gone in ways that she wasn't expecting and lots of stressors impacting her. Really needed to vent today lots of feelings and emotions and was more grounded towards session end. To work more on letting go of trying to have more control over things that are really out of her control. (re-direction of focus for patient) Also to continue working on communication skills discussed today and on her anxious thoughts and assumptions, challenging them, and replacing them with more positive and reality-based thoughts that do not lead to increased anxiety/frustration. Progress in relationship with S.O. and that is more supportive and helpful than it has been over past several months.  Goal review and progress/challenges noted.  Next appt within 2 weeks.   Beverly Ace, LCSW

## 2019-11-16 ENCOUNTER — Encounter: Payer: Self-pay | Admitting: Internal Medicine

## 2019-11-26 ENCOUNTER — Ambulatory Visit (INDEPENDENT_AMBULATORY_CARE_PROVIDER_SITE_OTHER): Payer: 59 | Admitting: Psychiatry

## 2019-11-26 ENCOUNTER — Other Ambulatory Visit: Payer: Self-pay

## 2019-11-26 DIAGNOSIS — F411 Generalized anxiety disorder: Secondary | ICD-10-CM

## 2019-11-26 NOTE — Progress Notes (Signed)
[facility] Counselor/Therapist Progress Note  Patient ID: Beverly Gonzalez, MRN: [id number],    Date: 11/26/2019   Time Spent: 60 minutes  8:00am to 9:00am  Treatment Type: Individual Therapy  Reported Symptoms: anxiety, very stressful relationships at home and that stress has escalated past 2 weeks.  Mental Status Exam:  Appearance:   Casual     Behavior:  Appropriate, Sharing and Motivated  Motor:  Normal  Speech/Language:   Normal Rate  Affect:  anxious, some anger and frustration  Mood:  anxious and irritable  Thought process:  goal directed  Thought content:    WNL  Sensory/Perceptual disturbances:    WNL  Orientation:  oriented to person, place, time/date, situation, day of week, month of year and year  Attention:  Good  Concentration:  Good  Memory:  WNL  Fund of knowledge:   Good  Insight:    Good  Judgment:   Good  Impulse Control:  Good and Fair   Risk Assessment: Danger to Self:  No Self-injurious Behavior: No Danger to Others: No Duty to Warn:no Physical Aggression / Violence:No  Access to Firearms a concern: No  Gang Involvement:No   Subjective: Patient in today with escalated stress and anxiety, heightened past 2 weeks mostly due to circumstances at home.  Interventions: Cognitive Behavioral Therapy, Solution-Oriented/Positive Psychology and Ego-Supportive  Diagnosis:   ICD-10-CM   1. Generalized anxiety disorder  F41.1      Plan: Treatment goals may remain on tx plan as patient works on strategies, and progress will be documented each session in the "Progress" sectionof Plan.  Treatment Goals: Patient not signing tx plan on computer screen due to COVID-19.  Long Term Goal: Enhance ability to handle effectively the full variety of life's anxieties, as evidenced by patient reporting at least 5 days a week her anxiety is rated a 6 or less on a 1-10 scale for anxiety (with 10 being the highest). (Update---Patient gave  birth to her baby on 04/08/2019, with medical problems continuing.)  Short Term Goal: .Increase understanding of beliefs and messages that produce anxiety and worry.  Strategy: Explore cognitive messages that mediate anxiety response and retrain in adaptive cognitions. Remain on her Zoloft unless advised differently by her OB-GYN. Will report progress or lack of progress in sessions.  PROGRESS: Patient in today and has had increased anxiety over the past 2 weeks due to situations at home primarily  There are work stressors also, but not "ongoing."  (Not all info included in this note due to patient's privacy needs.)  Patient was very upset initially and really needed the session today to vent and talk through her many thoughts and feelings related to S.O., communication, parenting 2 kids under age 42, and autistic child of S.O. Her and S.Ol disagree on certain ways on interacting and managing behaviors of younger children especially when tantrums or "meltdowns" occur.  Extended family and interactions complicate issues. Discussed communication strategies with emphasis on listening to really hear and understand and also on speaking in ways where we pay attention to "how we say, what we say".  Patient was calmer by session end and I encouraged her to not assume "how things are going to be" but rather try to have more conversation with S.O. where both can hear and understand each other better.  She was much calmer at session end, and did realize that right now she is dealing with some significant uncertainty and that she cannot control what all happens  in her situation.   Goal review and progress noted with patient.  Next appt within 2 weeks.   [name], LCSW

## 2019-12-10 ENCOUNTER — Other Ambulatory Visit: Payer: Self-pay

## 2019-12-10 ENCOUNTER — Ambulatory Visit (INDEPENDENT_AMBULATORY_CARE_PROVIDER_SITE_OTHER): Payer: 59 | Admitting: Psychiatry

## 2019-12-10 DIAGNOSIS — F411 Generalized anxiety disorder: Secondary | ICD-10-CM

## 2019-12-10 NOTE — Progress Notes (Signed)
°      [facility] Counselor/Therapist Progress Note  Patient ID: Beverly Gonzalez, MRN: [id number],    Date: 12/10/2019  Time Spent: 60 minutes  8:00am to 9:00am   Treatment Type: Individual Therapy  Reported Symptoms: anxiety, fatigue, disappointment  Mental Status Exam:  Appearance:   Casual     Behavior:  Appropriate, Sharing and Motivated  Motor:  Normal  Speech/Language:   Normal Rate  Affect:  anxious  Mood:  anxious  Thought process:  goal directed  Thought content:    WNL  Sensory/Perceptual disturbances:    WNL  Orientation:  oriented to person, place, time/date, situation, day of week, month of year and year  Attention:  Good  Concentration:  Good  Memory:  WNL  Fund of knowledge:   Good  Insight:    Good  Judgment:   Good  Impulse Control:  Good   Risk Assessment: Danger to Self:  No Self-injurious Behavior: No Danger to Others: No Duty to Warn:no Physical Aggression / Violence:No  Access to Firearms a concern: No  Gang Involvement:No   Subjective: Patient today reports anxiety, disappointment, stressed, and fatigue regarding family/work situations.  Interventions: Solution-Oriented/Positive Psychology and Ego-Supportive  Diagnosis:   ICD-10-CM   1. Generalized anxiety disorder  F41.1     Plan: Treatment goals may remain on tx plan as patient works on strategies, and progress will be documented each session in the "Progress" sectionof Plan.  Treatment Goals: Patient not signing tx plan on computer screen due to COVID-19.  Long Term Goal: Enhance ability to handle effectively the full variety of life's anxieties, as evidenced by patient reporting at least 5 days a week her anxiety is rated a 6 or less on a 1-10 scale for anxiety (with 10 being the highest). (Update---Patient gave birth to her baby on 04/08/2019, with medical problems continuing.)  Short Term Goal: .Increase understanding of beliefs and messages that  produce anxiety and worry.  Strategy: Explore cognitive messages that mediate anxiety response and retrain in adaptive cognitions. Remain on her Zoloft unless advised differently by her OB-GYN. Will report progress or lack of progress in sessions.  PROGRESS: Patent in today with anxiety, frustration, fatigue, and disappointment.  Has important and difficult decisions to make and needed the session today to process recent events at work and in home situations.  Home situations more acute.  (All details not included in this note due to patient privacy needs.)  Communication and relationship issues at home that are a major factor right now.  Discussed these concerns and how patient might make certain changes/decisions. Extended family relationships are problematic versus supportive.Reviewed communication strategies especially active listening, and modulating tone of voice combined with being aware of "how we say what we say." To focus more on getting adequate rest, what she can control versus not control, in addition to weighing decisions she may need to make in near future.  Goal review and progress noted with patient.  Next appt within 2 weeks.   [name], LCSW

## 2019-12-17 ENCOUNTER — Other Ambulatory Visit: Payer: Self-pay | Admitting: Family Medicine

## 2019-12-17 MED ORDER — NEOMYCIN-POLYMYXIN-HC 3.5-10000-1 OT SOLN: 4.0000 [drp] | Freq: Four times a day (QID) | OTIC | 0 refills | Status: DC

## 2019-12-21 ENCOUNTER — Other Ambulatory Visit: Payer: Self-pay | Admitting: Family Medicine

## 2019-12-21 MED ORDER — MUPIROCIN 2 % EX OINT: 1.0000 "application " | TOPICAL_OINTMENT | Freq: Two times a day (BID) | CUTANEOUS | 2 refills | Status: DC

## 2019-12-21 MED ORDER — NEOMYCIN-POLYMYXIN-HC 3.5-10000-1 OT SOLN: 4.0000 [drp] | Freq: Four times a day (QID) | OTIC | 2 refills | Status: DC

## 2019-12-24 ENCOUNTER — Ambulatory Visit (INDEPENDENT_AMBULATORY_CARE_PROVIDER_SITE_OTHER): Payer: 59 | Admitting: Psychiatry

## 2019-12-24 ENCOUNTER — Other Ambulatory Visit: Payer: Self-pay

## 2019-12-24 DIAGNOSIS — F411 Generalized anxiety disorder: Secondary | ICD-10-CM

## 2019-12-24 NOTE — Progress Notes (Signed)
      [facility] Counselor/Therapist Progress Note  Patient ID: Beverly Gonzalez, MRN: [id number],    Date: 12/24/2019  Time Spent: 60 minutes   9:00am to 10:00am  Treatment Type: Individual Therapy  Reported Symptoms: anxiety, frustration, indecision  Mental Status Exam:  Appearance:   Casual     Behavior:  Appropriate, Sharing and Motivated  Motor:  Normal  Speech/Language:   Normal Rate  Affect:  anxious  Mood:  anxious  Thought process:  normal  Thought content:    WNL  Sensory/Perceptual disturbances:    WNL  Orientation:  oriented to person, place, time/date, situation, day of week, month of year and year  Attention:  Good  Concentration:  Good  Memory:  WNL  Fund of knowledge:   Good  Insight:    Good  Judgment:   Good  Impulse Control:  Good and Fair   Risk Assessment: Danger to Self:  No Self-injurious Behavior: No Danger to Others: No Duty to Warn:no Physical Aggression / Violence:No  Access to Firearms a concern: No  Gang Involvement:No   Subjective:  Patient in today with anxiety, frustrations, anger, due mostly to relationship issues.  Stressed, trying to cope in healthy ways and make difficult decisions.  Interventions: Cognitive Behavioral Therapy and Solution-Oriented/Positive Psychology  Diagnosis:   ICD-10-CM   1. Generalized anxiety disorder  F41.1     Plan: Treatment goals may remain on tx plan as patient works on strategies, and progress will be documented each session in the "Progress" sectionof Plan.  Treatment Goals: Patient not signing tx plan on computer screen due to COVID-19.  Long Term Goal: Enhance ability to handle effectively the full variety of life's anxieties, as evidenced by patient reporting at least 5 days a week her anxiety is rated a 6 or less on a 1-10 scale for anxiety (with 10 being the highest). (Update---Patient gave birth to her baby on 04/08/2019, with medical problems  improving.)  Short Term Goal: .Increase understanding of beliefs and messages that produce anxiety and worry.  Strategy: Explore cognitive messages that mediate anxiety response and retrain in adaptive cognitions. Remain on her Zoloft unless advised differently by her OB-GYN. Will report progress or lack of progress in sessions.  PROGRESS: Patient in today expressing continued frustrations, hurt, disappointment, and anger mostly regarding relationship concerns however other person in relationship will not follow through in getting into some much needed counseling. Is trying to make good decisions and use effective communication skills which were reinforced in session today. Patient has little support in relationship and in trying to care for 2 young children ages 75 and under. Focused on her anxieties (per long term goal above) and also her need for good self-care (emotionally and physically).  Processed in more details situations occurring at home and work, with home being the more problematic for her. Discussed anxiety management skills, positive self-talk, taking breaks when able, communication skills especially when stressed, setting healthy limits and boundaries, getting adequate rest, and being more aware of what she can control versus not control. (Not all details shared in this note due to patient privacy needs.)  Is showing a lot of strength and perseverance in difficult situation.    Goal review and progress/challenges noted with patient.  Next appt within 2 weeiks.    [name], LCSW

## 2020-01-07 ENCOUNTER — Telehealth (INDEPENDENT_AMBULATORY_CARE_PROVIDER_SITE_OTHER): Payer: 59 | Admitting: Psychiatry

## 2020-01-07 DIAGNOSIS — F411 Generalized anxiety disorder: Secondary | ICD-10-CM

## 2020-01-07 NOTE — Progress Notes (Signed)
[facility] Counselor/Therapist Progress Note  Patient ID: Beverly Gonzalez, MRN: [id number],    Date: 01/07/2020  Time Spent: 60 minutes  9:05am to 10:05am   Virtual Visit Note via MyChart Note Connected with patient by a video enabled telemedicine/telehealth application or telephone, with their informed consent, and verified patient privacy and that I am speaking with the correct person using two identifiers. I discussed the limitations, risks, security and privacy concerns of performing psychotherapy and management service by telephone and the availability of in person appointments. I also discussed with the patient that there may be a patient responsible charge related to this service. The patient expressed understanding and agreed to proceed. I discussed the treatment planning with the patient. The patient was provided an opportunity to ask questions and all were answered. The patient agreed with the plan and demonstrated an understanding of the instructions. The patient was advised to call  our office if  symptoms worsen or feel they are in a crisis state and need immediate contact.   Therapist Location: [facility] Patient Location: home   Treatment Type: Individual Therapy  Reported Symptoms: anxiety, frustrated, some anger, very stressful family situations  Mental Status Exam:  Appearance:   Casual     Behavior:  Sharing and Motivated  Motor:  Normal  Speech/Language:   stressed due to her situation  Affect:  anxious  Mood:  anxious and some sadness re: situations within home  Thought process:  goal directed  Thought content:    WNL  Sensory/Perceptual disturbances:    WNL  Orientation:  oriented to person, place, time/date, situation, day of week, month of year and year  Attention:  Good  Concentration:  Good  Memory:  WNL  Fund of knowledge:   Good  Insight:    Good  Judgment:   Good  Impulse Control:  Good   Risk Assessment: Danger to  Self:  No Self-injurious Behavior: No Danger to Others: No Duty to Warn:no Physical Aggression / Violence:No  Access to Firearms a concern: No  Gang Involvement:No   Subjective: Patient today reporting anxiety, stressed with dynamics at home that feel unstable and unpredictable.   Interventions: Solution-Oriented/Positive Psychology and Ego-Supportive  Diagnosis:   ICD-10-CM   1. Generalized anxiety disorder  F41.1      Plan: Treatment goals may remain on tx plan as patient works on strategies, and progress will be documented each session in the "Progress" sectionof Plan.  Treatment Goals: Patient not signing tx plan on computer screen due to COVID-19.  Long Term Goal: Enhance ability to handle effectively the full variety of life's anxieties, as evidenced by patient reporting at least 5 days a week her anxiety is rated a 6 or less on a 1-10 scale for anxiety (with 10 being the highest). (Update---Patient gave birth to her baby on 04/08/2019, with medical problems improving.)  Short Term Goal: .Increase understanding of beliefs and messages that produce anxiety and worry.  Strategy: Explore cognitive messages that mediate anxiety response and retrain in adaptive cognitions. Remain on her Zoloft unless advised differently by her OB-GYN. Will report progress or lack of progress in sessions.  PROGRESS: Patient today very anxious and stressed [id number] at home, on top of work stress.  Some good days and some bad days, but no consistency.  S.O. continues to refuse to get help despite patient's encouragement but lack of willingness to arrange it for him, as she feels that should be his responsibility.  He remains  volatile but does not display any danger to others however patient is concerned about his instability and refusing to get help.  Patient processed a lot of her thoughts and feelings and concerns about this whole situation and beginning to see that she is  needing to set clearer limits.  She has her hands full with working full-time and trying to take care of 2 young children ages 61 and under.  Looked at what is in her control and what is beyond her control and she is trying to keep her focus on what she can control or change. Also discussed some work issues but the home situation is creating much more of patient's concerns for now. Reviewed anxiety-management skills, communication strategies, things to "let go of", getting enough rest, healthy boundaries, positive self-talk, allowing herself to take breaks, while trying to remain more in the present versus the past or too far into the future. Continues to be hopeful, showing strength and perseverance.  Goal review and progress/challenges noted with patient.  Next appt within 2 weeks.   [name], LCSW

## 2020-01-13 ENCOUNTER — Ambulatory Visit (INDEPENDENT_AMBULATORY_CARE_PROVIDER_SITE_OTHER): Payer: 59 | Admitting: Psychiatry

## 2020-01-13 DIAGNOSIS — F411 Generalized anxiety disorder: Secondary | ICD-10-CM | POA: Diagnosis not present

## 2020-01-13 NOTE — Progress Notes (Signed)
[facility] Counselor/Therapist Progress Note  Patient ID: Beverly Gonzalez, MRN: [id number],    Date: 01/13/2020  Time Spent: 60 minutes  10:00am to 11:00am   Virtual Visit Note via Martin with patient by a video enabled telemedicine/telehealth application or telephone, with their informed consent, and verified patient privacy and that I am speaking with the correct person using two identifiers. I discussed the limitations, risks, security and privacy concerns of performing psychotherapy and management service by telephone and the availability of in person appointments. I also discussed with the patient that there may be a patient responsible charge related to this service. The patient expressed understanding and agreed to proceed. I discussed the treatment planning with the patient. The patient was provided an opportunity to ask questions and all were answered. The patient agreed with the plan and demonstrated an understanding of the instructions. The patient was advised to call  our office if  symptoms worsen or feel they are in a crisis state and need immediate contact.   Therapist Location: [facility] Patient Location: home  Treatment Type: Individual Therapy  Reported Symptoms: anxiety, stressed, very hurt, frustrated, some anger  Mental Status Exam:  Appearance:   Casual     Behavior:  Appropriate, Sharing and Motivated  Motor:  Normal  Speech/Language:   Clear and Coherent  Affect:  anxious, stressed, sad  Mood:  anxious, sad and some underlying anger  Thought process:  goal directed  Thought content:    WNL  Sensory/Perceptual disturbances:    WNL  Orientation:  oriented to person, place, time/date, situation, day of week, month of year and year  Attention:  Good  Concentration:  Good  Memory:  WNL  Fund of knowledge:   Good  Insight:    Good  Judgment:   Good  Impulse Control:  Good   Risk Assessment: Danger to Self:   No Self-injurious Behavior: No Danger to Others: No Duty to Warn:no Physical Aggression / Violence:No  Access to Firearms a concern: No  Gang Involvement:No   Subjective: Patient today reports anxiety, hurt, frustration, and some anger.  Tearful intermittently.  Interventions: Solution-Oriented/Positive Psychology and Ego-Supportive  Diagnosis:   ICD-10-CM   1. Generalized anxiety disorder  F41.1     Plan: Treatment goals may remain on tx plan as patient works on strategies, and progress will be documented each session in the "Progress" sectionof Plan.  Treatment Goals: Patient not signing tx plan on computer screen due to COVID-19.  Long Term Goal: Enhance ability to handle effectively the full variety of life's anxieties, as evidenced by patient reporting at least 5 days a week her anxiety is rated a 6 or less on a 1-10 scale for anxiety (with 10 being the highest). (Update---Patient gave birth to her baby on 04/08/2019, with medical problemsimproving.)  Short Term Goal: .Increase understanding of beliefs and messages that produce anxiety and worry.  Strategy: Explore cognitive messages that mediate anxiety response and retrain in adaptive cognitions. Remain on her Zoloft unless advised differently by her OB-GYN. Will report progress or lack of progress in sessions.  PROGRESS: Patient called early this a.m. for appt due to escalation of situation at home.  Conflicts and stressful interactions continue at home with BF, and based on some of his behaviors, patient has recommended that he seek professional help.  Patient experiencing anxiety at times due to instability within the relationship with BF.   BF has reportedly decided to move out to an apartment  in the first week of August.  Patient saddened that BF would not get help needed earlier as she had hoped the relationship would work.  (Not all information is included in this note due to patient privacy  needs.)  Patient is tired and caring for her 2 young children ages 76 and under, plus working full-time.  Has a dependable nanny that is a big help.  For patient, home stress is on top of work stress so I have encouraged her to consider having the nanny help in ways where patient could be freed up to have some free time just  for herself.  Encouraged patient in proactive positive self-care, good communication strategies and anxiety management skills discussed in session.   Goal review and progress noted with patient.  Next appt within 2 weeks.   [name], LCSW

## 2020-01-21 ENCOUNTER — Ambulatory Visit (INDEPENDENT_AMBULATORY_CARE_PROVIDER_SITE_OTHER): Payer: 59 | Admitting: Psychiatry

## 2020-01-21 ENCOUNTER — Other Ambulatory Visit: Payer: Self-pay

## 2020-01-21 DIAGNOSIS — F411 Generalized anxiety disorder: Secondary | ICD-10-CM

## 2020-01-21 NOTE — Progress Notes (Signed)
[facility] Counselor/Therapist Progress Note  Patient ID: Beverly Gonzalez, MRN: [id number],    Date: 01/21/2020  Time Spent: 60 minutes 8:00am to 9:00am  Treatment Type: Individual Therapy  Reported Symptoms: anxiety, family/relationship issues, baby daughter with some health concerns, work issues  Mental Status Exam:  Appearance:   Casual     Behavior:  Appropriate, Sharing and Motivated  Motor:  Normal  Speech/Language:   Clear and Coherent  Affect:  anxious  Mood:  anxious  Thought process:  goal directed  Thought content:    WNL  Sensory/Perceptual disturbances:    WNL  Orientation:  oriented to person, place, time/date, situation, day of week, month of year and year  Attention:  Good  Concentration:  Good  Memory:  WNL  Fund of knowledge:   Good  Insight:    Good  Judgment:   Good  Impulse Control:  Good   Risk Assessment: Danger to Self:  No Self-injurious Behavior: No Danger to Others: No Duty to Warn:no Physical Aggression / Violence:No  Access to Firearms a concern: No  Gang Involvement:No   Subjective: Patient today reports anxiety, work issues, family/relationship issues, and baby daughter with health concerns but is improving.  Interventions: Solution-Oriented/Positive Psychology and Ego-Supportive  Diagnosis:   ICD-10-CM   1. Generalized anxiety disorder  F41.1      Plan: Treatment goals may remain on tx plan as patient works on strategies, and progress will be documented each session in the "Progress" sectionof Plan.  Treatment Goals: Patient not signing tx plan on computer screen due to COVID-19.  Long Term Goal: Enhance ability to handle effectively the full variety of life's anxieties, as evidenced by patient reporting at least 5 days a week her anxiety is rated a 6 or less on a 1-10 scale for anxiety (with 10 being the highest). (Update---Patient gave birth to her baby on 04/08/2019, with medical  problemsimproving.)  Short Term Goal: .Increase understanding of beliefs and messages that produce anxiety and worry.  Strategy: Explore cognitive messages that mediate anxiety response and retrain in adaptive cognitions. Remain on her Zoloft unless advised differently by her OB-GYN. Will report progress or lack of progress in sessions.  PROGRESS: Patient in today with anxiety but has had a better week with S.O. at home and the plan is still for S.O. to move into an apartment at least temporarily due to recent difficulties at home.  Patient feels that this past week went better because both of them do S.O. would be moving out soon.  She is apprehensive as to whether they break from each other will help but is hopeful that it will and that it will also give him time to focus on his on emotional health concerns and get treatment.  The apartment is not too far from patient's home and S.O. will still be able to visit his infant daughter at the home.  Patient processed a lot of her anxious thoughts and feelings about relationship concerns, but also her hopes for something good coming out of this upcoming time of living apart.  (Not all details are included in this note due to patient privacy needs.)  There are some particular concerns that she expressed that are not "danger related", but are concerns for her and she realizes that she cannot control everything that happens.  She is intent on keeping her young children safe and is very conscientious about their care.  She feels good about their current "nanny" but does  have some COVID related concerns.  Work stress is still present and the past 2 weeks have especially been stressful.  She discussed this in details and feels that she is working through her feelings on all that is going on, but is hopeful that some of the issues will diminish in near future.  Patient is working hard to look for the positives, realizing what she can control and what she  cannot control and focusing on what she can control or change.  To continue using healthy boundaries, getting enough rest/sleep, taking breaks as she can, holding onto her hopefulness, and using anxiety management skills and good communication skills.  Goal review and progress/challenges noted with patient.  Next appt within 2 weeks.   [name], LCSW

## 2020-02-04 ENCOUNTER — Ambulatory Visit (INDEPENDENT_AMBULATORY_CARE_PROVIDER_SITE_OTHER): Payer: 59 | Admitting: Psychiatry

## 2020-02-04 DIAGNOSIS — F411 Generalized anxiety disorder: Secondary | ICD-10-CM

## 2020-02-04 NOTE — Progress Notes (Signed)
[facility] Counselor/Therapist Progress Note  Patient ID: Beverly Gonzalez, MRN: [id number],    Date: 02/04/2020  Time Spent: 60 minutes   8:00am to 9:00am  Virtual Visit Note via MyChart Video: Connected with patient by a video enabled telemedicine/telehealth application or telephone, with their informed consent, and verified patient privacy and that I am speaking with the correct person using two identifiers. I discussed the limitations, risks, security and privacy concerns of performing psychotherapy and management service by telephone and the availability of in person appointments. I also discussed with the patient that there may be a patient responsible charge related to this service. The patient expressed understanding and agreed to proceed. I discussed the treatment planning with the patient. The patient was provided an opportunity to ask questions and all were answered. The patient agreed with the plan and demonstrated an understanding of the instructions. The patient was advised to call  our office if  symptoms worsen or feel they are in a crisis state and need immediate contact.   Therapist Location: [facility] Patient Location: home   Treatment Type: Individual Therapy  Reported Symptoms: anxiety, stressed  Mental Status Exam:  Appearance:   Casual     Behavior:  Appropriate, Sharing and Motivated  Motor:  Normal  Speech/Language:   Clear and Coherent  Affect:  anxious  Mood:  anxious  Thought process:  normal  Thought content:    WNL  Sensory/Perceptual disturbances:    WNL  Orientation:  oriented to person, place, time/date, situation, day of week, month of year and year  Attention:  Good  Concentration:  Good  Memory:  WNL  Fund of knowledge:   Good  Insight:    Good  Judgment:   Good  Impulse Control:  Good   Risk Assessment: Danger to Self:  No Self-injurious Behavior: No Danger to Others: No Duty to Warn:no Physical Aggression  / Violence:No  Access to Firearms a concern: No  Gang Involvement:No   Subjective: Patient today reports anxiety re: work, relationship, and family/home.   Interventions: Solution-Oriented/Positive Psychology and Ego-Supportive  Diagnosis:   ICD-10-CM   1. Generalized anxiety disorder  F41.1     Plan: Treatment goals may remain on tx plan as patient works on strategies, and progress will be documented each session in the "Progress" sectionof Plan.  Treatment Goals: Patient not signing tx plan on computer screen due to COVID-19.  Long Term Goal: Enhance ability to handle effectively the full variety of life's anxieties, as evidenced by patient reporting at least 5 days a week her anxiety is rated a 6 or less on a 1-10 scale for anxiety (with 10 being the highest). (Update---Patient gave birth to her baby on 04/08/2019, with medical problemsimproving.)  Short Term Goal: .Increase understanding of beliefs and messages that produce anxiety and worry.  Strategy: Explore cognitive messages that mediate anxiety response and retrain in adaptive cognitions. Remain on her Zoloft unless advised differently by her OB-GYN. Will report progress or lack of progress in sessions.  PROGRESS: Patient today reports anxiety and stress with work, relationship, and family/home.  Is more stressed recently and parenting 2 young children age 8 and under, with one of them having physical issues.  Needed session today to share and discuss home/relationship/kids issues.  Patient and significant other not living together at this time and she shares today that they had a different understanding as to what to expect moving forward., leading to more stress in the relationship.  Patient processed a lot of anxious thoughts as well as some sadness and disappointment, in the midst of a lot of uncertainty both in her relationship and just the whole COVID impact in her life and work.  Worked on several  of her anxious thoughts that tend to be repetitive and encouraged her to refrain from "worse case scenarios" and try to be more in the present versus jumping into the future that exacerbates worries.  (Not all details are included in this note due to patient privacy needs.)  Help patient look more at what she can control versus what she cannot control.  She is very anxious about herself and her young children during this time of increased COVID cases, and I reminded her of all the many things she is doing to try and keep her family safe, which she was able to acknowledge later herself.  Think it helped her to that a lot of her anxieties and talk about uncertainties that are important to her, as she did seem to feel more grounded and not as burdened with some of her concerns, trying to better understand what she can control and make a difference.  Is also concerned with some issues at work that she discussed today, and they are to have a meeting with upper-level staff present next week, so she is hoping to have more clarity about issues then.  To continue more positive self talk, getting enough rest and taking breaks occasionally, using good communication skills, maintaining healthy boundaries with others, keeping a sense of hopefulness, and using some of our previously discussed anxiety management skills.  Goal review and progress/challenges noted with patient.  Next appt within 2 weeks.   [name], LCSW

## 2020-02-18 ENCOUNTER — Telehealth (INDEPENDENT_AMBULATORY_CARE_PROVIDER_SITE_OTHER): Payer: 59 | Admitting: Psychiatry

## 2020-02-18 DIAGNOSIS — F411 Generalized anxiety disorder: Secondary | ICD-10-CM | POA: Diagnosis not present

## 2020-02-18 NOTE — Progress Notes (Signed)
[facility] Counselor/Therapist Progress Note  Patient ID: Beverly Gonzalez, MRN: [id number],    Date: 02/18/2020  Time Spent: 60 minutes  8:00am to 9:00am  Virtual Visit Note via Programmer, systems with patient by a video enabled telemedicine/telehealth application or telephone, with their informed consent, and verified patient privacy and that I am speaking with the correct person using two identifiers. I discussed the limitations, risks, security and privacy concerns of performing psychotherapy and management service by telephone and the availability of in person appointments. I also discussed with the patient that there may be a patient responsible charge related to this service. The patient expressed understanding and agreed to proceed. I discussed the treatment planning with the patient. The patient was provided an opportunity to ask questions and all were answered. The patient agreed with the plan and demonstrated an understanding of the instructions. The patient was advised to call  our office if  symptoms worsen or feel they are in a crisis state and need immediate contact.   Therapist Location: [facility] Patient Location: home   Treatment Type: Individual Therapy  Reported Symptoms: stressed, anxiety, frustration, some sadness re: relationship, irritability  Mental Status Exam:  Appearance:   Casual     Behavior:  Appropriate, Sharing and Motivated  Motor:  Normal  Speech/Language:   Clear and Coherent  Affect:  anxious, irritable, stressed, some sadness  Mood:  anxious, irritable and sad  Thought process:  goal directed  Thought content:    WNL  Sensory/Perceptual disturbances:    WNL  Orientation:  oriented to person, place, time/date, situation, day of week, month of year and year  Attention:  Good  Concentration:  Good  Memory:  WNL  Fund of knowledge:   Good  Insight:    Good  Judgment:   Good  Impulse Control:  Good   Risk  Assessment: Danger to Self:  No Self-injurious Behavior: No Danger to Others: No Duty to Warn:no Physical Aggression / Violence:No  Access to Firearms a concern: No  Gang Involvement:No   Subjective: Patient today reporting anxiety, feeling stressed, irritable, sadness and disappointment re: relationship. Adding to her stress is that her kids have been sick with colds, tested negative for Covid.   Interventions: Solution-Oriented/Positive Psychology and Ego-Supportive  Diagnosis:   ICD-10-CM   1. Generalized anxiety disorder  F41.1     Plan: Treatment goals may remain on tx plan as patient works on strategies, and progress will be documented each session in the "Progress" sectionof Plan.  Treatment Goals: Patient not signing tx plan on computer screen due to COVID-19.  Long Term Goal: Enhance ability to handle effectively the full variety of life's anxieties, as evidenced by patient reporting at least 5 days a week her anxiety is rated a 6 or less on a 1-10 scale for anxiety (with 10 being the highest). (Update---Patient gave birth to her baby on 04/08/2019, with medical problemsimproving.)  Short Term Goal: .Increase understanding of beliefs and messages that produce anxiety and worry.  Strategy: Explore cognitive messages that mediate anxiety response and retrain in adaptive cognitions. Remain on her Zoloft unless advised differently by her OB-GYN. Will report progress or lack of progress in sessions.  PROGRESS: Patient today reports anxiety, irritability, sadness and disappointment in relationship with young daughter's father.  Father did help recently with care of child overnight which gave patient a much needed break.  Stress and difficult communications at work. Worked in session today on her communication  skills, noticing how all of her stressors impact her communication and interactions with others. Lots of stress right now in several areas of her  life so we discussed stress management and what would be most helpful for her.  Encouraged to continue her good self-care including having some alone time just for her, staying I contact with supportive friends, deep breathing exercises, physical exercise, staying in the present and not jumping ahead to future worries, getting much needed breaks and rest, healthy boundaries with others, and working to make self-talk more positive.  Goal review and progress/challenges noted with patient.  Next appt wihin 2 weeks.   [name], LCSW

## 2020-03-03 ENCOUNTER — Other Ambulatory Visit: Payer: Self-pay

## 2020-03-03 ENCOUNTER — Ambulatory Visit (INDEPENDENT_AMBULATORY_CARE_PROVIDER_SITE_OTHER): Payer: 59 | Admitting: Psychiatry

## 2020-03-03 DIAGNOSIS — F411 Generalized anxiety disorder: Secondary | ICD-10-CM

## 2020-03-03 NOTE — Progress Notes (Signed)
[facility] Counselor/Therapist Progress Note  Patient ID: Denetria Luevanos, MRN: [id number],    Date: 03/03/2020  Time Spent: 60 minutes   8:00am to 9:00am  Treatment Type: Individual Therapy  Reported Symptoms: anxiety, very stressed  Mental Status Exam:  Appearance:   Casual     Behavior:  Appropriate, Sharing and Motivated  Motor:  Normal  Speech/Language:   Clear and Coherent  Affect:  anxious, frustrated  Mood:  anxious and frustrated  Thought process:  goal directed  Thought content:    WNL  Sensory/Perceptual disturbances:    WNL  Orientation:  oriented to person, place, time/date, situation, day of week, month of year and year  Attention:  Good  Concentration:  Good and Fair  Memory:  WNL  Fund of knowledge:   Good  Insight:    Good  Judgment:   Good  Impulse Control:  Good   Risk Assessment: Danger to Self:  No Self-injurious Behavior: No Danger to Others: No Duty to Warn:no Physical Aggression / Violence:No  Access to Firearms a concern: No  Gang Involvement:No   Subjective: Patient today reports anxiety, stressed with family and work and balancing her responsibilities at both.   Interventions: Cognitive Behavioral Therapy, Solution-Oriented/Positive Psychology and Ego-Supportive  Diagnosis:   ICD-10-CM   1. Generalized anxiety disorder  F41.1     Plan: Treatment goals may remain on tx plan as patient works on strategies, and progress will be documented each session in the "Progress" sectionof Plan.  Treatment Goals: Patient not signing tx plan on computer screen due to COVID-19.  Long Term Goal: Enhance ability to handle effectively the full variety of life's anxieties, as evidenced by patient reporting at least 5 days a week her anxiety is rated a 6 or less on a 1-10 scale for anxiety (with 10 being the highest). (Update---Patient gave birth to her baby on 04/08/2019, with medical problemsimproving.)  Short  Term Goal: .Increase understanding of beliefs and messages that produce anxiety and worry.  Strategy: Explore cognitive messages that mediate anxiety response and retrain in adaptive cognitions. Remain on her Zoloft unless advised differently by her OB-GYN. Will report progress or lack of progress in sessions.  PROGRESS: Patient in today reporting anxiety, stress, and needing to balance responsibilities with work and family, especially with 2 young children ages 72 and under. Needed session to process and vent lots of frustration, disappointment, anxiety, and concern about current family and work concerns. Did a good job in venting, then looking at real issues impacting her.  (not all information included in this note due to patient privacy needs.)  Is still living apart from father of her young daughter and having difficulty in their discussions together, seems to always escalate and nothing is resolved.  Patient planning to set limits with him on upcoming visit and suggested they not try to discuss anything personally right now with some many stressors going on, and just keep their focus on the kids.  Patient does have a weekend getaway trip planned with her children next weekend which she is hoping will offer them some good, restful, time away from stressors at home and work.  Did share some of her anxious thoughts that tend to occur often and we worked on challenging them to see that they are more exaggerated than what would likely happen, helping her to replace those anxious thoughts with more realistic and empowering thoughts.  She is dealing with a lot of uncertainties and things being  out of her control right now so it has been easier for her anxious thoughts to emerge.  Encouraged to practice the thought interruption and replacement between sessions as a way of better managing them.  To also continue taking breaks occasionally throughout the day, focusing on good communication skills including  listening to really hear rather than respond, keeping responses calm so that people can really hear what is being said versus just "a tone", using healthy boundaries with others, maintaining hopefulness, and working to make herself talk more consistently positive.  Goal review and progress/challenges noted with patient.  Next appt within 2-3 weeks.   [name], LCSW

## 2020-03-12 ENCOUNTER — Other Ambulatory Visit: Payer: Self-pay | Admitting: Internal Medicine

## 2020-03-12 DIAGNOSIS — N61 Mastitis without abscess: Secondary | ICD-10-CM

## 2020-03-12 MED ORDER — CEPHALEXIN 500 MG PO CAPS: 500.0000 mg | ORAL_CAPSULE | Freq: Four times a day (QID) | ORAL | 0 refills | Status: AC

## 2020-03-12 NOTE — Progress Notes (Signed)
.  ke

## 2020-03-17 ENCOUNTER — Ambulatory Visit (INDEPENDENT_AMBULATORY_CARE_PROVIDER_SITE_OTHER): Payer: 59 | Admitting: Psychiatry

## 2020-03-17 ENCOUNTER — Other Ambulatory Visit: Payer: Self-pay

## 2020-03-17 DIAGNOSIS — F411 Generalized anxiety disorder: Secondary | ICD-10-CM | POA: Diagnosis not present

## 2020-03-17 NOTE — Progress Notes (Signed)
      [facility] Counselor/Therapist Progress Note  Patient ID: Beverly Gonzalez, MRN: [id number],    Date: 03/17/2020  Time Spent: 60 minutes  8:00am to 9:00am  Treatment Type: Individual Therapy  Reported Symptoms: anxiety  Mental Status Exam:  Appearance:   Casual     Behavior:  Appropriate, Sharing and Motivated  Motor:  Normal  Speech/Language:   Clear and Coherent  Affect:  anxious  Mood:  anxious and some "stressed"  Thought process:  normal  Thought content:    WNL  Sensory/Perceptual disturbances:    WNL  Orientation:  oriented to person, place, time/date, situation, day of week, month of year and year  Attention:  Good  Concentration:  Good  Memory:  WNL  Fund of knowledge:   Good  Insight:    Good  Judgment:   Good  Impulse Control:  Good   Risk Assessment: Danger to Self:  No Self-injurious Behavior: No Danger to Others: No Duty to Warn:no Physical Aggression / Violence:No  Access to Firearms a concern: No  Gang Involvement:No   Subjective: Patient today reports anxiety, stress, and feel her anxiety level has reduced some after a recent weekend away.   Interventions: Cognitive Behavioral Therapy, Solution-Oriented/Positive Psychology and Ego-Supportive  Diagnosis:   ICD-10-CM   1. Generalized anxiety disorder  F41.1      Plan: Treatment goals may remain on tx plan as patient works on strategies, and progress will be documented each session in the "Progress" sectionof Plan.  Treatment Goals: Patient not signing tx plan on computer screen due to COVID-19.  Long Term Goal: Enhance ability to handle effectively the full variety of life's anxieties, as evidenced by patient reporting at least 5 days a week her anxiety is rated a 6 or less on a 1-10 scale for anxiety (with 10 being the highest). (Update---Patient gave birth to her baby on 04/08/2019, with medical problemsimproving.)  Short Term Goal: .Increase  understanding of beliefs and messages that produce anxiety and worry.  Strategy: Explore cognitive messages that mediate anxiety response and retrain in adaptive cognitions. Remain on her Zoloft unless advised differently by her OB-GYN. Will report progress or lack of progress in sessions.  PROGRESS: Patient in today reporting anxiety, continued relationship/family issues. Took a weekend off and that has helped her anxiety. Juggling multiple roles and responsibilities with 2 kids ages 20 and under, and working full time in health care.  Recent episode of food poisoning but better within 24 hrs. Worked on communication, especially really hearing the other person, and also having patience in the midst of wanting things to change.  Insight and judgment good.  Sleep has been better since youngest child went into their own room. Some anxious thoughts persist and we worked today more on recognizing and changing the thoughts to be more reality based and encouraging.  Used several examples with this strategy.  Encouraged patient to continue good self-care including positive self talk, setting some alone time for herself, letting others help her as they can with young children, focusing more on what she can control versus cannot control, exercising healthy boundaries with others, maintaining a sense of hopefulness, and staying more in the present rather than the past or future.  Goal review and progress/challenges noted with patient.  Next appt within 2 weeks.    [name], LCSW

## 2020-03-31 ENCOUNTER — Ambulatory Visit (INDEPENDENT_AMBULATORY_CARE_PROVIDER_SITE_OTHER): Payer: 59 | Admitting: Psychiatry

## 2020-03-31 ENCOUNTER — Other Ambulatory Visit: Payer: Self-pay

## 2020-03-31 DIAGNOSIS — F411 Generalized anxiety disorder: Secondary | ICD-10-CM

## 2020-03-31 NOTE — Progress Notes (Signed)
[facility] Counselor/Therapist Progress Note  Patient ID: Beverly Gonzalez, MRN: [id number],    Date: 03/31/2020  Time Spent: 60 minutes   8:00am to 9:00am  Treatment Type: Individual Therapy  Reported Symptoms: anxiety, stressed, anger and frustration  Mental Status Exam:  Appearance:   Neat     Behavior:  Appropriate, Sharing and Motivated  Motor:  Normal  Speech/Language:   Clear and Coherent  Affect:  anxious  Mood:  anxious  Thought process:  normal  Thought content:    WNL  Sensory/Perceptual disturbances:    WNL  Orientation:  oriented to person, place, time/date, situation, day of week, month of year and year  Attention:  Good  Concentration:  Good  Memory:  WNL  Fund of knowledge:   Good  Insight:    Good  Judgment:   Good  Impulse Control:  Good   Risk Assessment: Danger to Self:  No Self-injurious Behavior: No Danger to Others: No Duty to Warn:no Physical Aggression / Violence:No  Access to Firearms a concern: No  Gang Involvement:No   Subjective: Patient today reporting anxiety and feeling stressed. Is getting youngest child into 1/2 day daycare so patient will have a break for herself. Still having difficulties communicating with father of youngest child.   Interventions: Solution-Oriented/Positive Psychology and Ego-Supportive  Diagnosis:   ICD-10-CM   1. Generalized anxiety disorder  F41.1      Plan: Treatment goals may remain on tx plan as patient works on strategies, and progress will be documented each session in the "Progress" sectionof Plan.  Treatment Goals: Patient not signing tx plan on computer screen due to COVID-19.  Long Term Goal: Enhance ability to handle effectively the full variety of life's anxieties, as evidenced by patient reporting at least 5 days a week her anxiety is rated a 6 or less on a 1-10 scale for anxiety (with 10 being the highest). (Update---Patient gave birth to her baby on  04/08/2019, with medical problemsimproving.)  Short Term Goal: .Increase understanding of beliefs and messages that produce anxiety and worry.  Strategy: Explore cognitive messages that mediate anxiety response and retrain in adaptive cognitions. Remain on her Zoloft unless advised differently by her OB-GYN. Will report progress or lack of progress in sessions.  PROGRESS: Patient in today reporting anxiety, feeling stressed, angry, but is also taking some steps for her own self-care including getting her youngest child enrolled in a 4-hour daycare program so that patient has some free time just to herself.  Continued stress and conflict within some of the family and patient states she is trying to remain more calm, rational, but not letting herself be manipulated by others.  Due to the stress and conflict, patient has had significant anxious thoughts which we worked on in session today per short-term goal and strategy in her treatment plan above.  She does respond well and working with strategies and is more grounded and calm at session and.  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  Did review stress management strategies today and also communication with the father of her youngest child, again focusing more on active listening rather than just what we say in conversation.  Patient to continue good self-care including setting limits and boundaries with others, staying more in the present moment rather than jumping to the future no going to the past and she is doing  better with this, focusing on what she can control versus cannot control, definitely looking for more positives than negatives, allowing for more time alone for herself, maintaining a sense of hopefulness and positiveness about herself recognizing her strengths, letting others help as needed, using good communication  skills, and making her self-talk more positive.  Goal review and progress/challenges noted with patient.  Next appt within 2-3 weeks.   [name], LCSW

## 2020-04-14 ENCOUNTER — Other Ambulatory Visit: Payer: Self-pay

## 2020-04-14 ENCOUNTER — Ambulatory Visit (INDEPENDENT_AMBULATORY_CARE_PROVIDER_SITE_OTHER): Payer: 59 | Admitting: Psychiatry

## 2020-04-14 DIAGNOSIS — F411 Generalized anxiety disorder: Secondary | ICD-10-CM | POA: Diagnosis not present

## 2020-04-14 NOTE — Progress Notes (Signed)
[facility] Counselor/Therapist Progress Note  Patient ID: Beverly Gonzalez, MRN: [id number],    Date: 04/14/2020  Time Spent: 60 minutes   10:00am to 11:00am  Treatment Type: Individual Therapy  Reported Symptoms: anxiety (especially re: family, work, relationships)   Mental Status Exam:  Appearance:   Casual     Behavior:  Appropriate, Sharing and Motivated  Motor:  Normal  Speech/Language:   Clear and Coherent  Affect:  anxious, frustrated  Mood:  anxious and frustrated, some sadness  Thought process:  goal directed  Thought content:    WNL  Sensory/Perceptual disturbances:    WNL  Orientation:  oriented to person, place, time/date, situation, day of week, month of year and year  Attention:  Good  Concentration:  Good  Memory:  WNL  Fund of knowledge:   Good  Insight:    Good  Judgment:   Good  Impulse Control:  Good   Risk Assessment: Danger to Self:  No Self-injurious Behavior: No Danger to Others: No Duty to Warn:no Physical Aggression / Violence:No  Access to Firearms a concern: No  Gang Involvement:No   Subjective:  Patient today with anxiety and frustration with family situations, relationships, and work. Just had a partial week and recent weekend with some extended family which was very stressful and conflictual at times.   Interventions: Cognitive Behavioral Therapy and Solution-Oriented/Positive Psychology  Diagnosis:   ICD-10-CM   1. Generalized anxiety disorder  F41.1     Plan: Treatment goals may remain on tx plan as patient works on strategies, and progress will be documented each session in the "Progress" sectionof Plan.  Treatment Goals: Patient not signing tx plan on computer screen due to COVID-19.  Long Term Goal: Enhance ability to handle effectively the full variety of life's anxieties, as evidenced by patient reporting at least 5 days a week her anxiety is rated a 6 or less on a 1-10 scale for anxiety (with 10  being the highest). (Update---Patient gave birth to her baby on 04/08/2019, with medical problemsimproving.)  Short Term Goal: .Increase understanding of beliefs and messages that produce anxiety and worry.  Strategy: Explore cognitive messages that mediate anxiety response and retrain in adaptive cognitions. Remain on her Zoloft unless advised differently by her OB-GYN. Will report progress or lack of progress in sessions.  PROGRESS: Patient in today with escalated anxiety, stressed, some anger and frustration mostly due to family and relationship situations and conflicts.  Work environment has also been stressful and problematic in recent weeks.  Family situations involving patient,  fathers of children, and both sets of grandparents of children. Patient today needing to process a lot of her thoughts and feelings about happenings within the past week with family.  Wanting to get some relief through venting confidentially and also look at strategies and good communication skills for handling conflicts, different agendas, and and different viewpoints amongst the people involved.  During discussion of all of this, patient also realized some things within herself that she is currently working on to make her communication and stress management better, and also she had some realizations I think she had not thought about previously but is now aware of that will be helpful to her going forward as she will still need to be able to communicate in more helpful ways with the various family members involved. (Use of Short Term goal and Strategy in Tx  Plan above.)Patient continues to do what seems to be an amazing follow-up with single  parenting 2 children 60 years old and younger, and trying to work more effectively with their fathers.  She was definitely more grounded by session and I think it was helpful to her to be able to discuss at length some recent involvement with the various family members  mentioned above and especially within the past week.  Encouraged patient to continue positive self-care including being around supportive people outside the family, setting appropriate limits and boundaries with others at work and within the family, staying more focused on the present rather than the past or future, looking for more positives and negatives and she is making progress with this, being more self aware in communication and using the positive skills that she is working on including as she acknowledged today when silence would be better than speaking, allowing herself to have some time alone, focusing on things she can impact or control versus cannot, recognizing her strengths, allowing others to help as needed, making self talk more positive, and maintaining a sense of hopefulness.  Goal review and progress/challenges noted with patient.  Next appt within 2-3 weeks.   [name], LCSW

## 2020-04-28 ENCOUNTER — Ambulatory Visit (INDEPENDENT_AMBULATORY_CARE_PROVIDER_SITE_OTHER): Payer: 59 | Admitting: Psychiatry

## 2020-04-28 DIAGNOSIS — F411 Generalized anxiety disorder: Secondary | ICD-10-CM

## 2020-04-28 NOTE — Progress Notes (Signed)
[facility] Counselor/Therapist Progress Note  Patient ID: Madie Cahn, MRN: [id number],    Date: 04/28/2020  Time Spent: 60 minutes  9:00am to 10:00am  Virtual Visit Note via Programmer, systems with patient by a video enabled telemedicine/telehealth application or telephone, with their informed consent, and verified patient privacy and that I am speaking with the correct person using two identifiers. I discussed the limitations, risks, security and privacy concerns of performing psychotherapy and management service by telephone and the availability of in person appointments. I also discussed with the patient that there may be a patient responsible charge related to this service. The patient expressed understanding and agreed to proceed. I discussed the treatment planning with the patient. The patient was provided an opportunity to ask questions and all were answered. The patient agreed with the plan and demonstrated an understanding of the instructions. The patient was advised to call  our office if  symptoms worsen or feel they are in a crisis state and need immediate contact.   Therapist Location: [facility] Patient Location: home   Treatment Type: Individual Therapy  Reported Symptoms: anxiety, frustration mostly due to personal,work, and family stressors  Mental Status Exam:  Appearance:   Casual     Behavior:  Appropriate, Sharing and Motivated  Motor:  Normal  Speech/Language:   Clear and Coherent  Affect:  anxiety, frustrated  Mood:  anxious  Thought process:  goal directed  Thought content:    WNL  Sensory/Perceptual disturbances:    WNL  Orientation:  oriented to person, place, time/date, situation, day of week, month of year and year  Attention:  Good  Concentration:  Good  Memory:  WNL  Fund of knowledge:   Good  Insight:    Good  Judgment:   Good  Impulse Control:  Good   Risk Assessment: Danger to Self:  No Self-injurious  Behavior: No Danger to Others: No Duty to Warn:no Physical Aggression / Violence:No  Access to Firearms a concern: No  Gang Involvement:No   Subjective: Patient today reports anxiety and lots of frustration and stress due to personal, work, and family stressors.  Interventions: Cognitive Behavioral Therapy, Solution-Oriented/Positive Psychology and Ego-Supportive  Diagnosis:   ICD-10-CM   1. Generalized anxiety disorder  F41.1     Plan: Treatment goals may remain on tx plan as patient works on strategies, and progress will be documented each session in the "Progress" sectionof Plan.  Treatment Goals: Patient not signing tx plan on computer screen due to COVID-19.  Long Term Goal: Enhance ability to handle effectively the full variety of life's anxieties, as evidenced by patient reporting at least 5 days a week her anxiety is rated a 6 or less on a 1-10 scale for anxiety (with 10 being the highest). (Update---Patient gave birth to her baby on 04/08/2019, with medical problemsimproving.)  Short Term Goal: .Increase understanding of beliefs and messages that produce anxiety and worry.  Strategy: Explore cognitive messages that mediate anxiety response and retrain in adaptive cognitions. Remain on her Zoloft unless advised differently by her OB-GYN. Will report progress or lack of progress in sessions.  PROGRESS: Patient today reporting anxiety increased some more recently espeically due to family stressors with father of her young daughter. Patient concerned about the things the father was saying over the phone and did share info with his doctor, out of concern. Processed her concerns with father of daughter in session today especially re: more appropriate boundaries and helping patient set  healthy limits and arrive at appropriate responses in managing conflictual interactions, per short-term goal and strategy and treatment plan above.  She is making progress in  this area even in the face of increased challenges. Work situation has improved some more recently which is encouraging for patient.  Encouraged patient to practice positive self-care including carving out some time for herself as she is able, setting appropriate boundaries with the fathers of both of her children, healthy nutrition and exercise, staying in the present and not jumping ahead to the future, focusing on things she can control or impact versus cannot, in for more positives and negatives, working on her tone and communication as she is involved in stressful or conflictual situations, and being mindful of self talk and making it more positive.  Goal review and progress/challenges noted with patient.  Next appointment within 2 weeks.   [name], LCSW

## 2020-05-10 ENCOUNTER — Other Ambulatory Visit (INDEPENDENT_AMBULATORY_CARE_PROVIDER_SITE_OTHER): Payer: Self-pay

## 2020-05-10 MED ORDER — CEPHALEXIN 500 MG PO CAPS: 500.0000 mg | ORAL_CAPSULE | Freq: Four times a day (QID) | ORAL | 0 refills | Status: DC

## 2020-05-26 ENCOUNTER — Ambulatory Visit (INDEPENDENT_AMBULATORY_CARE_PROVIDER_SITE_OTHER): Payer: 59 | Admitting: Psychiatry

## 2020-05-26 ENCOUNTER — Other Ambulatory Visit: Payer: Self-pay

## 2020-05-26 DIAGNOSIS — D2371 Other benign neoplasm of skin of right lower limb, including hip: Secondary | ICD-10-CM | POA: Diagnosis not present

## 2020-05-26 DIAGNOSIS — D2262 Melanocytic nevi of left upper limb, including shoulder: Secondary | ICD-10-CM | POA: Diagnosis not present

## 2020-05-26 DIAGNOSIS — D2271 Melanocytic nevi of right lower limb, including hip: Secondary | ICD-10-CM | POA: Diagnosis not present

## 2020-05-26 DIAGNOSIS — F411 Generalized anxiety disorder: Secondary | ICD-10-CM | POA: Diagnosis not present

## 2020-05-26 DIAGNOSIS — Z319 Encounter for procreative management, unspecified: Secondary | ICD-10-CM | POA: Diagnosis not present

## 2020-05-26 DIAGNOSIS — D225 Melanocytic nevi of trunk: Secondary | ICD-10-CM | POA: Diagnosis not present

## 2020-05-26 DIAGNOSIS — D224 Melanocytic nevi of scalp and neck: Secondary | ICD-10-CM | POA: Diagnosis not present

## 2020-05-26 DIAGNOSIS — Z30432 Encounter for removal of intrauterine contraceptive device: Secondary | ICD-10-CM | POA: Diagnosis not present

## 2020-05-26 DIAGNOSIS — D2261 Melanocytic nevi of right upper limb, including shoulder: Secondary | ICD-10-CM | POA: Diagnosis not present

## 2020-05-26 DIAGNOSIS — Z124 Encounter for screening for malignant neoplasm of cervix: Secondary | ICD-10-CM | POA: Diagnosis not present

## 2020-05-26 DIAGNOSIS — D2272 Melanocytic nevi of left lower limb, including hip: Secondary | ICD-10-CM | POA: Diagnosis not present

## 2020-05-26 DIAGNOSIS — Z01419 Encounter for gynecological examination (general) (routine) without abnormal findings: Secondary | ICD-10-CM | POA: Diagnosis not present

## 2020-05-26 NOTE — Progress Notes (Signed)
[facility] Counselor/Therapist Progress Note  Patient ID: Beverly Gonzalez, MRN: [id number],    Date: 05/26/2020  Time Spent:  60 minutes   8:00am to 9:00am   Treatment Type: Individual Therapy  Reported Symptoms: anxiety, frustration  Mental Status Exam:  Appearance:   Casual     Behavior:  Appropriate, Sharing and Motivated  Motor:  Normal  Speech/Language:   Clear and Coherent  Affect:  anxious  Mood:  anxious and frustrated  Thought process:  normal  Thought content:    WNL  Sensory/Perceptual disturbances:    WNL  Orientation:  oriented to person, place, time/date, situation, day of week, month of year and year  Attention:  Good  Concentration:  Good  Memory:  WNL  Fund of knowledge:   Good  Insight:    Good  Judgment:   Good  Impulse Control:  Good   Risk Assessment: Danger to Self:  No Self-injurious Behavior: No Danger to Others: No Duty to Warn:no Physical Aggression / Violence:No  Access to Firearms a concern: No  Gang Involvement:No   Subjective: Patient today reporting anxiety, feeling stressed and frustrated, as she deals with work and family/personal issues.  Interventions: Solution-Oriented/Positive Psychology and Ego-Supportive  Diagnosis:   ICD-10-CM   1. Generalized anxiety disorder  F41.1      Plan: Treatment goals may remain on tx plan as patient works on strategies, and progress will be documented each session in the "Progress" sectionof Plan.  Treatment Goals: Patient not signing tx plan on computer screen due to COVID-19.  Long Term Goal: Enhance ability to handle effectively the full variety of life's anxieties, as evidenced by patient reporting at least 5 days a week her anxiety is rated a 6 or less on a 1-10 scale for anxiety (with 10 being the highest). (Update---Patient gave birth to her baby on 04/08/2019, with medical problemsimproving.)  Short Term Goal: .Increase understanding of beliefs  and messages that produce anxiety and worry.  Strategy: Explore cognitive messages that mediate anxiety response and retrain in adaptive cognitions. Remain on her Zoloft unless advised differently by her OB-GYN. Will report progress or lack of progress in sessions.  PROGRESS: Patient in today reporting anxiety, frustration, and feeling stressed with multiple stressful situations going on in her personal and family relationships, her job, and parenting 2 small children ages 77 and under.  Continues to have difficulties in communicating and coparenting with the fathers of both of her children, including some doubt about one of them as far as they are ability to appropriately parent.  She is remaining very careful and watchful, to her children are always safe even if that means limiting visits outside the home when necessary.  Worked today with her short-term goal and strategy and treatment plan above, in processing some of her more recent anxiety responses to certain stressors.  Accentuated the need for her own personal self-care and how that needs to be an ongoing priority for patient to address and make sure she is providing the self-care she needs on an ongoing basis.  She shared several situations that has occurred within the family that were of concern to her, and it seemed like she did handle them as well as possible at the time, with the addition of her having more time for self-care.  She did discuss some options that she has thought of where she could have the children for longer period of time at daycare which would give patient some additional time  for self-care and that is her current plan at this time.  Encouraged patient in addition to more positive self-care, to also set appropriate boundaries with others as needed, continue working on her tone in communication especially when under a lot of stress, continue her exercise, focus on what she can control versus cannot, intentionally look for more  positives than negatives each day and she is making progress on this, stay in the present, remember to "pause" occasionally or take a brief break when feeling stressed, and working to make self talk more positive and affirming.  Goal review and progress/challenges noted with patient.  Next appointment within 2 weeks.   Beverly Ace, LCSW

## 2020-06-07 ENCOUNTER — Other Ambulatory Visit: Payer: Self-pay

## 2020-06-07 ENCOUNTER — Ambulatory Visit (INDEPENDENT_AMBULATORY_CARE_PROVIDER_SITE_OTHER): Payer: 59 | Admitting: Psychiatry

## 2020-06-07 DIAGNOSIS — F411 Generalized anxiety disorder: Secondary | ICD-10-CM

## 2020-06-07 NOTE — Progress Notes (Signed)
[facility] Counselor/Therapist Progress Note  Patient ID: Beverly Gonzalez, MRN: [id number],    Date: 06/07/2020  Time Spent: 60 minutes    9:00 AM to 10:00 AM  Treatment Type: Individual Therapy  Reported Symptoms: Anxiety, stressed, fatigue, frustration  Mental Status Exam:  Appearance:   Casual     Behavior:  Appropriate, Sharing and Motivated  Motor:  Normal  Speech/Language:   Clear and Coherent  Affect:  anxious, stressed  Mood:  anxious and frustration, stressed  Thought process:  goal directed  Thought content:    WNL  Sensory/Perceptual disturbances:    WNL  Orientation:  oriented to person, place, time/date, situation, day of week, month of year and year  Attention:  Good  Concentration:  Good  Memory:  WNL  Fund of knowledge:   Good  Insight:    Good  Judgment:   Good  Impulse Control:  Good   Risk Assessment: Danger to Self:  No Self-injurious Behavior: No Danger to Others: No Duty to Warn:no Physical Aggression / Violence:No  Access to Firearms a concern: No  Gang Involvement:No   Subjective: Patient in today reporting anxiety, frustration, fatigue, some anger. Participated in a team building activity at work that went well and seemed to feel that may help the work environment.   Interventions: Solution-Oriented/Positive Psychology and Ego-Supportive  Diagnosis:   ICD-10-CM   1. Generalized anxiety disorder  F41.1      Plan: Treatment goals may remain on tx plan as patient works on strategies, and progress will be documented each session in the "Progress" sectionof Plan.  Treatment Goals: Patient not signing tx plan on computer screen due to COVID-19.  Long Term Goal: Enhance ability to handle effectively the full variety of life's anxieties, as evidenced by patient reporting at least 5 days a week her anxiety is rated a 6 or less on a 1-10 scale for anxiety (with 10 being the highest). (Update---Patient gave  birth to her baby on 04/08/2019, with medical problemsimproving.)  Short Term Goal: .Increase understanding of beliefs and messages that produce anxiety and worry.  Strategy: Explore cognitive messages that mediate anxiety response and retrain in adaptive cognitions. Remain on her Zoloft unless advised differently by her OB-GYN. Will report progress or lack of progress in sessions.  PROGRESS: Patient in today reporting anxiety, feeling stressed, some anger and frustration with personal, work, and family situations. Processing concerns re: former BF and father of her young daughter due to his unhealthy choices and behaviors. Some uncertainties and reasons for patient to remain cautious and set very clear boundaries, which she reports she is doing. Is considering getting alarm system at home.. Discussed her concerns and goes above and beyond to be safe for herself and her 2 children.  Reports being extra careful with her kids due to the latest surge in COVID cases.Continued some work today with strategy and short-term goal  and treatment plan above, as she shared some of her recent anxiety responses to stressors including former BF, conversations with former husband, and at work.  Self-care including time for herself was discussed with patient and she get more sleep on these to she is out of work for vacation.  No longer feels guilty when she lets the children stay at daycare a little longer so that she can get some task done uninterrupted but also have more time for self.  Encouraged positive self talk and self-care including looking intentionally for more positives each day, staying in  the present, setting appropriate boundaries as needed, continued work on Armed forces logistics/support/administrative officer especially "her tone" and also when under stress, staying in contact with others who are supportive of her, and practicing taking a pause occasionally to get a brief break when things are feeling more stressful to  her.  Goal review and progress/challenges noted with patient.  Next appointment within 2 to 3 weeks.  [name], LCSW

## 2020-06-19 ENCOUNTER — Other Ambulatory Visit (INDEPENDENT_AMBULATORY_CARE_PROVIDER_SITE_OTHER): Payer: Self-pay | Admitting: Family Medicine

## 2020-06-19 MED ORDER — POLYMYXIN B-TRIMETHOPRIM 10000-0.1 UNIT/ML-% OP SOLN: 1.0000 [drp] | OPHTHALMIC | 0 refills | Status: AC

## 2020-06-23 ENCOUNTER — Other Ambulatory Visit: Payer: Self-pay

## 2020-06-23 ENCOUNTER — Ambulatory Visit (INDEPENDENT_AMBULATORY_CARE_PROVIDER_SITE_OTHER): Payer: 59 | Admitting: Psychiatry

## 2020-06-23 DIAGNOSIS — F411 Generalized anxiety disorder: Secondary | ICD-10-CM

## 2020-06-23 NOTE — Progress Notes (Signed)
[facility] Counselor/Therapist Progress Note  Patient ID: Beverly Gonzalez, MRN: [id number],    Date: 06/23/2020  Time Spent: 60 minutes    8:00am to 9:00am   Treatment Type: Individual Therapy  Reported Symptoms: anxiety, family stressors and relationship issues  Mental Status Exam:  Appearance:   Casual     Behavior:  Appropriate, Sharing and Motivated  Motor:  Normal  Speech/Language:   Clear and Coherent  Affect:  anxious  Mood:  anxious  Thought process:  goal directed  Thought content:    WNL  Sensory/Perceptual disturbances:    WNL  Orientation:  oriented to person, place, time/date, situation, day of week, month of year and year  Attention:  Good  Concentration:  Good  Memory:  WNL  Fund of knowledge:   Good  Insight:    Good and Fair  Judgment:   Good  Impulse Control:  Good   Risk Assessment: Danger to Self:  No Self-injurious Behavior: No Danger to Others: No Duty to Warn:no Physical Aggression / Violence:No  Access to Firearms a concern: No  Gang Involvement:No   Subjective: Patient today reports anxiety and a lot of interpersonal and family challenges during the holiday time recently.  Despite how difficult this time was, I think it really helped patient see more clearly in some of the family relationship issues and began having better boundaries and limit setting with others as needed.  Not all information is included in this note due to patient privacy needs.  Interventions: Solution-Oriented/Positive Psychology and Ego-Supportive  Diagnosis:   ICD-10-CM   1. Generalized anxiety disorder  F41.1      Plan: Treatment goals may remain on tx plan as patient works on strategies, and progress will be documented each session in the "Progress" sectionof Plan.  Treatment Goals: Patient not signing tx plan on computer screen due to COVID-19.  Long Term Goal: Enhance ability to handle effectively the full variety of life's anxieties,  as evidenced by patient reporting at least 5 days a week her anxiety is rated a 6 or less on a 1-10 scale for anxiety (with 10 being the highest). (Update---Patient gave birth to her baby on 04/08/2019, with medical problemsimproving.)  Short Term Goal: .Increase understanding of beliefs and messages that produce anxiety and worry.  Strategy: Explore cognitive messages that mediate anxiety response and retrain in adaptive cognitions. Remain on her Zoloft unless advised differently by her OB-GYN. Will report progress or lack of progress in sessions.  PROGRESS: Patient in today reporting anxiety as her main symptom.  Some of the holiday time went well but the time with family and other people connected to family, tended to include a lot of interpersonal conflict and challenges which heightened patient's anxiety, especially with former BF.  Today in session she talked through multiple situations that occurred over the holidays and was able to vent her thoughts and feelings without judgment.  Discussed ways in which patient can set much clearer boundaries with others, and not always feels she has to respond when communication feels manipulative, controlling, or threatening in any way.  Has spoken with attorney and has another appointment this coming week. Also looked at the ways she has coped really well and provided good care for her 2 young children, under the age of 17, during this time.  She is very clear on setting limits that do protect her children. Reports work is going a little better more recently with less stress and conflict.  Also shares she is being extra mindful and careful with her kids due to [place] again.  Reviewed short-term goals and strategies and treatment plan above as patient continues to work on managing stressors and change her responses to be more effective.. Encouraged to practice positive and affirming self-care/self talk, maintaining contact with people who  are supportive of her, intentionally looking for more positives every day, staying in the present versus the past or future, continued work on communication skills especially when under stress, practicing taking breaks occasionally during the day especially when things are feeling more stressful, and setting clear boundaries with others as needed.  Goal review and progress/challenges noted with patient.  Next appointment within 2 to 3 weeks.   [name], LCSW

## 2020-06-26 ENCOUNTER — Other Ambulatory Visit: Payer: Self-pay | Admitting: Internal Medicine

## 2020-06-27 ENCOUNTER — Other Ambulatory Visit: Payer: Self-pay | Admitting: Internal Medicine

## 2020-07-07 ENCOUNTER — Ambulatory Visit (INDEPENDENT_AMBULATORY_CARE_PROVIDER_SITE_OTHER): Payer: 59 | Admitting: Psychiatry

## 2020-07-07 DIAGNOSIS — F411 Generalized anxiety disorder: Secondary | ICD-10-CM | POA: Diagnosis not present

## 2020-07-07 NOTE — Progress Notes (Signed)
[facility] Counselor/Therapist Progress Note  Patient ID: Beverly Gonzalez, MRN: [id number],    Date: 07/07/2020   Time Spent: 60 minutes    8:00am to 9:00am  Virtual Visit Note via Programmer, systems with patient by a video enabled telemedicine/telehealth application or telephone, with their informed consent, and verified patient privacy and that I am speaking with the correct person using two identifiers. I discussed the limitations, risks, security and privacy concerns of performing psychotherapy and management service by telephone and the availability of in person appointments. I also discussed with the patient that there may be a patient responsible charge related to this service. The patient expressed understanding and agreed to proceed. I discussed the treatment planning with the patient. The patient was provided an opportunity to ask questions and all were answered. The patient agreed with the plan and demonstrated an understanding of the instructions. The patient was advised to call  our office if  symptoms worsen or feel they are in a crisis state and need immediate contact.   Therapist Location: [facility] Patient Location: home  Treatment Type: Individual Therapy  Reported Symptoms: anxiety, frustrations with co-parenting issues for her 2 young children  Mental Status Exam:  Appearance:   Casual     Behavior:  Appropriate, Sharing and Motivated  Motor:  Normal  Speech/Language:   Clear and Coherent  Affect:  anxious  Mood:  anxious  Thought process:  goal directed  Thought content:    WNL  Sensory/Perceptual disturbances:    WNL  Orientation:  oriented to person, place, time/date, situation, day of week, month of year and year  Attention:  Good  Concentration:  Good  Memory:  WNL  Fund of knowledge:   Good  Insight:    Good  Judgment:   Good  Impulse Control:  Good   Risk Assessment: Danger to Self:  No Self-injurious Behavior:  No Danger to Others: No Duty to Warn:no Physical Aggression / Violence:No  Access to Firearms a concern: No  Gang Involvement:No   Subjective: Patient today reporting anxiety, and frustration with co-parenting difficulties for her 2 young children.    Interventions: Solution-Oriented/Positive Psychology and Ego-Supportive  Diagnosis:   ICD-10-CM   1. Generalized anxiety disorder  F41.1     Plan: Treatment goals may remain on tx plan as patient works on strategies, and progress will be documented each session in the "Progress" sectionof Plan.  Treatment Goals: Patient not signing tx plan on computer screen due to COVID-19.  Long Term Goal: Enhance ability to handle effectively the full variety of life's anxieties, as evidenced by patient reporting at least 5 days a week her anxiety is rated a 6 or less on a 1-10 scale for anxiety (with 10 being the highest). (Update---Patient gave birth to her baby on 04/08/2019, with medical problemsimproving.)  Short Term Goal: .Increase understanding of beliefs and messages that produce anxiety and worry.  Strategy: Explore cognitive messages that mediate anxiety response and retrain in adaptive cognitions. Remain on her Zoloft unless advised differently by her OB-GYN. Will report progress or lack of progress in sessions.  PROGRESS: Patient today reporting anxiety and ongoing concerns with co-parenting with the fathers of her 2 children. Frustrated and anxious but also demonstrating some increased strength in following through on having more support and taking care of her own self better. Concerns with ex-BF still exist but "have not worsened" and patient has been more proactive in getting alarm system installed at her  home. Has become more consistent in setting limits with ex-BF especially with clearer boundaries and keeping those boundaries.  Also stronger in not responding to someone when they are making inappropriate  attempts to be in contact with her, and are against the boundaries she has set. Trying to not let herself be controlled, threatened, nor manipulated and this seems to be helping her have a healthier distance and some relationships.  She remains very clear on having healthy limits that do protect her children.  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  So far she has not tested positive for COVID and is about 1 of 4  in her office that has not.  Using her long-term goal, short-term goals and strategies in her treatment plan above, emphasis today was on reducing and more effective management of her anxiety, as well as increasing her own self-care.  She is to continue this between sessions in addition to practicing more positive/affirming self talk, intentionally looking for more positives daily, remaining in the present and focusing on what she can change or control, intentionally taking breaks for herself throughout the day especially at more stressful times, setting and keeping clear boundaries with others as needed, continue to work on communication skills included active listening as discussed in session today, and staying in contact with others who are supportive of her.  Goal review and progress/challenges noted with patient.  Next appointment within 2 to 3 weeks.   [name], LCSW

## 2020-07-10 DIAGNOSIS — Z1159 Encounter for screening for other viral diseases: Secondary | ICD-10-CM | POA: Diagnosis not present

## 2020-07-21 ENCOUNTER — Ambulatory Visit (INDEPENDENT_AMBULATORY_CARE_PROVIDER_SITE_OTHER): Payer: 59 | Admitting: Psychiatry

## 2020-07-21 DIAGNOSIS — F411 Generalized anxiety disorder: Secondary | ICD-10-CM | POA: Diagnosis not present

## 2020-07-21 NOTE — Progress Notes (Signed)
[facility] Counselor/Therapist Progress Note  Patient ID: Beverly Gonzalez, MRN: [id number],    Date: 07/21/2020  Time Spent: 60 minutes   8:00am to 9:00am  Virtual Visit Note via Programmer, systems with patient by a video enabled telemedicine/telehealth application or telephone, with their informed consent, and verified patient privacy and that I am speaking with the correct person using two identifiers. I discussed the limitations, risks, security and privacy concerns of performing psychotherapy and management service by telephone and the availability of in person appointments. I also discussed with the patient that there may be a patient responsible charge related to this service. The patient expressed understanding and agreed to proceed. I discussed the treatment planning with the patient. The patient was provided an opportunity to ask questions and all were answered. The patient agreed with the plan and demonstrated an understanding of the instructions. The patient was advised to call  our office if  symptoms worsen or feel they are in a crisis state and need immediate contact.   Therapist Location: [facility]  Patient Location: home  Treatment Type: Individual Therapy  Reported Symptoms: anxiety, frustration, relationship issues  Mental Status Exam:  Appearance:   Casual     Behavior:  Appropriate, Sharing and Motivated  Motor:  Normal  Speech/Language:   Clear and Coherent  Affect:  anxious  Mood:  anxious  Thought process:  goal directed  Thought content:    WNL  Sensory/Perceptual disturbances:    WNL  Orientation:  oriented to person, place, time/date, situation, day of week, month of year and year  Attention:  Good  Concentration:  Good  Memory:  WNL  Fund of knowledge:   Good  Insight:    Good  Judgment:   Good  Impulse Control:  Good   Risk Assessment: Danger to Self:  No Self-injurious Behavior: No Danger to Others: No Duty to  Warn:no Physical Aggression / Violence:No  Access to Firearms a concern: No  Gang Involvement:No   Subjective: Today reporting anxiety, frustration, and relationship issues.  Interventions: Cognitive Behavioral Therapy and Solution-Oriented/Positive Psychology  Diagnosis:   ICD-10-CM   1. Generalized anxiety disorder  F41.1     Plan: Treatment goals may remain on tx plan as patient works on strategies, and progress will be documented each session in the "Progress" sectionof Plan.  Treatment Goals: Patient not signing tx plan on computer screen due to COVID-19.  Long Term Goal: Enhance ability to handle effectively the full variety of life's anxieties, as evidenced by patient reporting at least 5 days a week her anxiety is rated a 6 or less on a 1-10 scale for anxiety (with 10 being the highest). (Update---Patient gave birth to her baby on 04/08/2019, with medical problemsimproving.)  Short Term Goal: .Increase understanding of beliefs and messages that produce anxiety and worry.  Strategy: Explore cognitive messages that mediate anxiety response and retrain in adaptive cognitions. Remain on her Zoloft unless advised differently by her OB-GYN. Will report progress or lack of progress in sessions.  PROGRESS: Patient today reporting anxiety, frustration, and relationship difficulties. Hard to "not have defined expectations in relationship".  Uncomfortable with uncertainty, and hard to be patient. Realizing some discomfort due to her issues from past that she is still working on. Some improvement in boundaries and we worked today on that, using specific examples per info patient shared. Concerns with trying to co-parent with father of her young daughter, and we processed these some especially as it  relates to boundaries and not being manipulated, and also being aware of some her difficulties in responding to other parent. Needing more consistency in her boundaries and  we discussed how she might be more aware of that consistency. Is feeling some better about co-parenting with father of young son, as that parent is showing some personal growth and maturity which is helping the co-parenting relationship. 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  Reviewed some of work from last session to help her in reducing and better managing her anxiety and encouraged her to continue this between sessions. Encouraged patient in positive/affirming self-care and self-talk, stay in the present focused on what she can control, getting in a better sleep routine. Practice improving communication skills discussed in session including active listening, practicing clear boundaries with others as needed, and maintaining contact with supportive people in her life.  Goal review and progress/challenges noted with patient.  Next appointment within 2 to 3 weeks.   [name], LCSW

## 2020-08-04 ENCOUNTER — Ambulatory Visit: Payer: 59 | Admitting: Psychiatry

## 2020-08-18 ENCOUNTER — Ambulatory Visit (INDEPENDENT_AMBULATORY_CARE_PROVIDER_SITE_OTHER): Payer: 59 | Admitting: Psychiatry

## 2020-08-18 DIAGNOSIS — F411 Generalized anxiety disorder: Secondary | ICD-10-CM

## 2020-08-18 NOTE — Progress Notes (Signed)
[facility] Counselor/Therapist Progress Note  Patient ID: Beverly Gonzalez, MRN: [id number],    Date: 08/18/2020  Time Spent: 60 minutes   8:00am to 9:00am  Virtual Visit Note v:ia MyChart Video Connected with patient by a video enabled telemedicine/telehealth application or telephone, with their informed consent, and verified patient privacy and that I am speaking with the correct person using two identifiers. I discussed the limitations, risks, security and privacy concerns of performing psychotherapy and management service by telephone and the availability of in person appointments. I also discussed with the patient that there may be a patient responsible charge related to this service. The patient expressed understanding and agreed to proceed. I discussed the treatment planning with the patient. The patient was provided an opportunity to ask questions and all were answered. The patient agreed with the plan and demonstrated an understanding of the instructions. The patient was advised to call  our office if  symptoms worsen or feel they are in a crisis state and need immediate contact.   Therapist Location: [facility] Patient Location: home   Treatment Type: Individual Therapy  Reported Symptoms: anxiety  Mental Status Exam:  Appearance:   casual     Behavior:  Appropriate, Sharing and Motivated  Motor:  Normal  Speech/Language:   Clear and Coherent  Affect:  anxious  Mood:  anxious  Thought process:  goal directed  Thought content:    WNL  Sensory/Perceptual disturbances:    WNL  Orientation:  oriented to person, place, time/date, situation, day of week, month of year and year  Attention:  Good  Concentration:  Good  Memory:  WNL  Fund of knowledge:   Good  Insight:    Good  Judgment:   Good  Impulse Control:  Good   Risk Assessment: Danger to Self:  No Self-injurious Behavior: No Danger to Others: No Duty to Warn:no Physical Aggression /  Violence:No  Access to Firearms a concern: No  Gang Involvement:No   Subjective: Patient today reporting anxiety and difficulties in personal/family relationships, that have "been bringing up the past for her."   Interventions: Cognitive Behavioral Therapy and Solution-Oriented/Positive Psychology  Diagnosis:   ICD-10-CM   1. Generalized anxiety disorder  F41.1      Plan: Treatment goals may remain on tx plan as patient works on strategies, and progress will be documented each session in the "Progress" sectionof Plan.  Treatment Goals: Patient not signing tx plan on computer screen due to COVID-19.  Long Term Goal: Enhance ability to handle effectively the full variety of life's anxieties, as evidenced by patient reporting at least 5 days a week her anxiety is rated a 6 or less on a 1-10 scale for anxiety (with 10 being the highest). (Update---Patient gave birth to her baby on 04/08/2019, with medical problemsimproving.)  Short Term Goal: .Increase understanding of beliefs and messages that produce anxiety and worry.  Strategy: Explore cognitive messages that mediate anxiety response and retrain in adaptive cognitions. Remain on her Zoloft unless advised differently by her OB-GYN. Will report progress or lack of progress in sessions.  PROGRESS: Patient today reporting anxiety and stress, with relationship issues especially in communication. Described several recent situations where communication was a major issue.  Trying not to point blame towards others and accept what and how she needs to work on to be more effective communicator, including listening to really hear the other person rather than listening just to respond, and also being very attentive to how  she says what she says. Has had some discussions to more clearly define relationship with S.O. which seems to have helped both her and S.O. Working on being ok however when everything is not certain, when she  finds it difficult to be patient.  Also working on issues with father of daughter and irresponsibility and trying to work together more effectively.  Noticed increased self-awareness on patient's part in session today.  Did review some more from prior sessions regarding anxiety management which was helpful to patient.  Encouraged her to continue with positive/affirming self-care and self talk, continued work on Armed forces logistics/support/administrative officer discussed today especially with active listening, set and keep clear boundaries with others as needed, stay in the present focusing on what she can control, maintaining contact with supportive people, and realizing the strengths she is showing and working on her goals and moving forward.   Goal review and progress/challenges noted with patient.  Next appointment within 3 weeks.   Beverly Ace, LCSW

## 2020-09-05 ENCOUNTER — Other Ambulatory Visit (HOSPITAL_BASED_OUTPATIENT_CLINIC_OR_DEPARTMENT_OTHER): Payer: Self-pay

## 2020-09-11 ENCOUNTER — Telehealth: Payer: Self-pay | Admitting: Neurology

## 2020-09-11 NOTE — Telephone Encounter (Signed)
Ariana: Can you put her on my schedule tomorrow at 4pm as a new patient? She may be late so put that in the comments. New patient for migraines. thanks

## 2020-09-12 ENCOUNTER — Ambulatory Visit (INDEPENDENT_AMBULATORY_CARE_PROVIDER_SITE_OTHER): Payer: 59 | Admitting: Neurology

## 2020-09-12 DIAGNOSIS — F458 Other somatoform disorders: Secondary | ICD-10-CM

## 2020-09-13 NOTE — Progress Notes (Signed)
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 Patient did well. NO CHARGE.

## 2020-09-19 ENCOUNTER — Encounter: Payer: Self-pay | Admitting: Neurology

## 2020-09-19 ENCOUNTER — Ambulatory Visit (INDEPENDENT_AMBULATORY_CARE_PROVIDER_SITE_OTHER): Payer: Self-pay | Admitting: Neurology

## 2020-09-19 DIAGNOSIS — G244 Idiopathic orofacial dystonia: Secondary | ICD-10-CM

## 2020-09-19 DIAGNOSIS — M62838 Other muscle spasm: Secondary | ICD-10-CM

## 2020-09-19 NOTE — Progress Notes (Signed)
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 I am going to try and get her approved

## 2020-09-21 ENCOUNTER — Other Ambulatory Visit: Payer: Self-pay

## 2020-09-22 ENCOUNTER — Encounter: Payer: Self-pay | Admitting: Internal Medicine

## 2020-09-22 ENCOUNTER — Other Ambulatory Visit (HOSPITAL_COMMUNITY): Payer: Self-pay

## 2020-09-22 ENCOUNTER — Ambulatory Visit (INDEPENDENT_AMBULATORY_CARE_PROVIDER_SITE_OTHER): Payer: 59 | Admitting: Internal Medicine

## 2020-09-22 VITALS — BP 102/68 | HR 63 | Temp 97.7°F | Ht 69.0 in | Wt 141.7 lb

## 2020-09-22 DIAGNOSIS — F411 Generalized anxiety disorder: Secondary | ICD-10-CM

## 2020-09-22 DIAGNOSIS — Z0000 Encounter for general adult medical examination without abnormal findings: Secondary | ICD-10-CM

## 2020-09-22 MED ORDER — SERTRALINE HCL 50 MG PO TABS
50.0000 mg | ORAL_TABLET | Freq: Every day | ORAL | 1 refills | Status: DC
  Filled 2020-09-22: qty 90, 90d supply, fill #0
  Filled 2020-12-15: qty 90, 90d supply, fill #1

## 2020-09-22 NOTE — Progress Notes (Signed)
Established Patient Office Visit     This visit occurred during the SARS-CoV-2 public health emergency.  Safety protocols were in place, including screening questions prior to the visit, additional usage of staff PPE, and extensive cleaning of exam room while observing appropriate contact time as indicated for disinfecting solutions.    CC/Reason for Visit: Annual preventive exam  HPI: Beverly Gonzalez is a 35 y.o. female who is coming in today for the above mentioned reasons. Past Medical History is significant for: Generalized anxiety disorder on Zoloft.  She has routine eye and dental care.  All immunizations are up-to-date.  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 Jaynee Eagles.  Other than this she has been doing well.   Past Medical/Surgical History: Past Medical History:  Diagnosis Date  . Anxiety   . Severe preeclampsia, third trimester 11/18/2016  . Vaginal Pap smear, abnormal     Past Surgical History:  Procedure Laterality Date  . CESAREAN SECTION N/A 11/20/2016   Procedure: CESAREAN SECTION;  Surgeon: Janyth Pupa, DO;  Location: [facility];  Service: Obstetrics;  Laterality: N/A;  . COLONOSCOPY    . TONSILLECTOMY    . WISDOM TOOTH EXTRACTION      Social History:  reports that she has never smoked. She has never used smokeless tobacco. She reports that she does not drink alcohol and does not use drugs.  Allergies: No Known Allergies  Family History:  Family History  Problem Relation Age of Onset  . Hypertension Mother   . Diabetes Father   . Hypertension Father   . Heart disease Maternal Grandmother   . Heart disease Maternal Grandfather   . Other Maternal Grandfather        covid  . Diabetes Paternal Grandfather   . Heart disease Paternal Grandfather   . Stroke Paternal Grandfather   . Heart attack Paternal Grandfather      Current Outpatient Medications:  .  cetirizine (ZYRTEC) 10 MG tablet, Take 10 mg by  mouth daily., Disp: , Rfl:  .  fluticasone (FLONASE) 50 MCG/ACT nasal spray, Place into both nostrils daily., Disp: , Rfl:  .  Prenatal Vit-Fe Fumarate-FA (PRENATAL PO), Take 1 tablet by mouth daily., Disp: , Rfl:  .  VITAMIN D PO, Take by mouth., Disp: , Rfl:  .  sertraline (ZOLOFT) 50 MG tablet, TAKE 1 TABLET BY MOUTH ONCE A DAY, Disp: 90 tablet, Rfl: 1  Review of Systems:  Constitutional: Denies fever, chills, diaphoresis, appetite change and fatigue.  HEENT: Denies photophobia, eye pain, redness, hearing loss, ear pain, congestion, sore throat, rhinorrhea, sneezing, mouth sores, trouble swallowing, neck pain, neck stiffness and tinnitus.   Respiratory: Denies SOB, DOE, cough, chest tightness,  and wheezing.   Cardiovascular: Denies chest pain, palpitations and leg swelling.  Gastrointestinal: Denies nausea, vomiting, abdominal pain, diarrhea, constipation, blood in stool and abdominal distention.  Genitourinary: Denies dysuria, urgency, frequency, hematuria, flank pain and difficulty urinating.  Endocrine: Denies: hot or cold intolerance, sweats, changes in hair or nails, polyuria, polydipsia. Musculoskeletal: Denies myalgias, back pain, joint swelling, arthralgias and gait problem.  Skin: Denies pallor, rash and wound.  Neurological: Denies dizziness, seizures, syncope, weakness, light-headedness, numbness and headaches.  Hematological: Denies adenopathy. Easy bruising, personal or family bleeding history  Psychiatric/Behavioral: Denies suicidal ideation, mood changes, confusion, nervousness, sleep disturbance and agitation    Physical Exam: Vitals:   09/22/20 1133  BP: 102/68  Pulse: 63  Temp: 97.7 F (36.5 C)  SpO2: 98%  Weight: 141 lb 11.2 oz (64.3 kg)  Height: 5\' 9"  (1.753 m)    Body mass index is 20.93 kg/m.   Constitutional: NAD, calm, comfortable Eyes: PERRL, lids and conjunctivae normal ENMT: Mucous membranes are moist. Posterior pharynx clear of any exudate or  lesions. Normal dentition. Tympanic membrane is pearly white, no erythema or bulging. Neck: normal, supple, no masses, no thyromegaly Respiratory: clear to auscultation bilaterally, no wheezing, no crackles. Normal respiratory effort. No accessory muscle use.  Cardiovascular: Regular rate and rhythm, no murmurs / rubs / gallops. No extremity edema. 2+ pedal pulses.  Abdomen: no tenderness, no masses palpated. No hepatosplenomegaly. Bowel sounds positive.  Musculoskeletal: no clubbing / cyanosis. No joint deformity upper and lower extremities. Good ROM, no contractures. Normal muscle tone.  Skin: no rashes, lesions, ulcers. No induration Neurologic: CN 2-12 grossly intact. Sensation intact, DTR normal. Strength 5/5 in all 4.  Psychiatric: Normal judgment and insight. Alert and oriented x 3. Normal mood.    Impression and Plan:  Encounter for preventive health examination -She has routine eye and dental care. -All immunizations are up-to-date and age-appropriate. -She had labs done during Dr. Stay screening, she will forward results to me when available. -Healthy lifestyle discussed in detail. -She follows with GYN routinely for cervical cancer screening, not yet at age for breast or colon cancer screening.  Generalized anxiety disorder  - Plan: sertraline (ZOLOFT) 50 MG tablet     Korin Setzler Isaac Bliss, MD [facility]

## 2020-09-26 ENCOUNTER — Telehealth: Payer: Self-pay | Admitting: Neurology

## 2020-09-26 NOTE — Telephone Encounter (Signed)
I called patient and got her scheduled for 5/24 at 1:00.

## 2020-09-26 NOTE — Telephone Encounter (Signed)
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 Patient has [employer]. If approved, MD requests patient comes back for injection 6 weeks from 4/6. I called [phone]) and spoke with Devin. Devin states no PA is required. Reference [phone].

## 2020-09-28 ENCOUNTER — Other Ambulatory Visit (HOSPITAL_COMMUNITY): Payer: Self-pay

## 2020-09-28 ENCOUNTER — Other Ambulatory Visit: Payer: Self-pay | Admitting: Internal Medicine

## 2020-09-28 DIAGNOSIS — N61 Mastitis without abscess: Secondary | ICD-10-CM

## 2020-09-28 MED ORDER — CEPHALEXIN 500 MG PO CAPS
500.0000 mg | ORAL_CAPSULE | Freq: Three times a day (TID) | ORAL | 0 refills | Status: DC
  Filled 2020-09-28: qty 21, 7d supply, fill #0

## 2020-10-02 ENCOUNTER — Other Ambulatory Visit (HOSPITAL_COMMUNITY): Payer: Self-pay

## 2020-10-04 ENCOUNTER — Other Ambulatory Visit: Payer: Self-pay | Admitting: Obstetrics & Gynecology

## 2020-10-04 ENCOUNTER — Other Ambulatory Visit (HOSPITAL_COMMUNITY): Payer: Self-pay

## 2020-10-04 MED ORDER — MISOPROSTOL 200 MCG PO TABS
200.0000 ug | ORAL_TABLET | Freq: Every evening | ORAL | 0 refills | Status: DC
  Filled 2020-10-04: qty 1, 1d supply, fill #0

## 2020-10-06 ENCOUNTER — Encounter: Payer: Self-pay | Admitting: Obstetrics & Gynecology

## 2020-10-06 ENCOUNTER — Ambulatory Visit (INDEPENDENT_AMBULATORY_CARE_PROVIDER_SITE_OTHER): Payer: 59 | Admitting: Obstetrics & Gynecology

## 2020-10-06 ENCOUNTER — Other Ambulatory Visit: Payer: Self-pay

## 2020-10-06 VITALS — BP 115/75 | HR 65 | Ht 69.0 in | Wt 145.0 lb

## 2020-10-06 DIAGNOSIS — Z30430 Encounter for insertion of intrauterine contraceptive device: Secondary | ICD-10-CM | POA: Diagnosis not present

## 2020-10-06 DIAGNOSIS — Z3009 Encounter for other general counseling and advice on contraception: Secondary | ICD-10-CM

## 2020-10-06 MED ORDER — LEVONORGESTREL 20 MCG/24HR IU IUD: INTRAUTERINE_SYSTEM | Freq: Once | INTRAUTERINE | Status: AC

## 2020-10-06 NOTE — Progress Notes (Signed)
   IUD INSERTION Patient name: Beverly Gonzalez MRN [id number]  Date of birth: 1986/05/22 Subjective Findings:   Beverly Gonzalez is a 35 y.o. [phone] female being seen today for insertion of a Mirena IUD. Former Animal nutritionist patient- currently using POPs, but wishes to transition to a more permanent option.  While she would desire another pregnancy- due to h/o HELLP she is concerned about future risk/complications.  Depression screen [facility] 2/9 10/06/2020 09/22/2020 10/01/2019  Decreased Interest 0 0 0  Down, Depressed, Hopeless 0 0 0  PHQ - 2 Score 0 0 0  Altered sleeping 0 2 0  Tired, decreased energy 1 1 0  Change in appetite 0 0 0  Feeling bad or failure about yourself  0 1 0  Trouble concentrating 0 0 0  Moving slowly or fidgety/restless 0 0 0  Suicidal thoughts 0 0 0  PHQ-9 Score 1 4 0  Difficult doing work/chores - Not difficult at all Not difficult at all  Some encounter information is confidential and restricted. Go to Review Flowsheets activity to see all data.    Patient's last menstrual period was 10/05/2020. Last pap 05/2020. Results were: NILM w/ HRHPV negative  The risks and benefits of the method and placement have been thouroughly reviewed with the patient and all questions were answered.  Specifically the patient is aware of failure rate of 06/998, expulsion of the IUD and of possible perforation.  The patient is aware of irregular bleeding due to the method and understands the incidence of irregular bleeding diminishes with time.  Signed copy of informed consent in chart.  Pertinent History Reviewed:   Reviewed past medical,surgical, social, obstetrical and family history.  Reviewed problem list, medications and allergies. Objective Findings & Procedure:   Vitals:   10/06/20 1229  BP: 115/75  Pulse: 65  Weight: 145 lb (65.8 kg)  Height: 5\' 9"  (1.753 m)  Body mass index is 21.41 kg/m.  No results found for this or any previous visit (from the past 24  hour(s)).   Time out was performed.  A sterile speculum was placed in the vagina.  The cervix was visualized, prepped using Betadine, and grasped with a single tooth tenaculum. The uterus was found to be anteroflexed and it sounded to 7 cm.  Mirena  IUD placed per manufacturer's recommendations. The strings were trimmed to approximately 3 cm. The patient tolerated the procedure well.   Informal transvaginal sonogram was performed and the proper placement of the IUD was verified.  Chaperone: Market researcher & Plan:   1) Mirena IUD insertion The patient was given post procedure instructions, including signs and symptoms of infection and to check for the strings after each menses or each month, and refraining from intercourse or anything in the vagina for 3 days. She was given a care card with date IUD placed, and date IUD to be removed. She is scheduled for a f/u appointment in 6-8 weeks if desired.  No orders of the defined types were placed in this encounter.   Return in about 1 year (around 10/06/2021) for Annual.  Janyth Pupa, DO Attending [place], [facility] for [facility], [place]

## 2020-11-07 ENCOUNTER — Ambulatory Visit: Payer: 59 | Admitting: Neurology

## 2020-11-07 ENCOUNTER — Encounter: Payer: Self-pay | Admitting: *Deleted

## 2020-11-16 ENCOUNTER — Telehealth: Payer: Self-pay | Admitting: Internal Medicine

## 2020-11-16 NOTE — Telephone Encounter (Signed)
Pt is calling in wanting to know is Dr. Jerilee Hoh would change her anxiety medication w/out being seen.  Pt declined an appointment for next week.

## 2020-11-29 ENCOUNTER — Encounter: Payer: Self-pay | Admitting: Internal Medicine

## 2020-11-30 ENCOUNTER — Other Ambulatory Visit (HOSPITAL_COMMUNITY): Payer: Self-pay

## 2020-11-30 ENCOUNTER — Other Ambulatory Visit: Payer: Self-pay | Admitting: Internal Medicine

## 2020-11-30 DIAGNOSIS — N61 Mastitis without abscess: Secondary | ICD-10-CM

## 2020-11-30 MED ORDER — CARESTART COVID-19 HOME TEST VI KIT
PACK | 0 refills | Status: DC
  Filled 2020-11-30: qty 4, 4d supply, fill #0

## 2020-11-30 MED ORDER — CEPHALEXIN 500 MG PO CAPS
500.0000 mg | ORAL_CAPSULE | Freq: Three times a day (TID) | ORAL | 0 refills | Status: AC
  Filled 2020-11-30: qty 21, 7d supply, fill #0

## 2020-12-07 ENCOUNTER — Ambulatory Visit (INDEPENDENT_AMBULATORY_CARE_PROVIDER_SITE_OTHER): Payer: 59 | Admitting: Neurology

## 2020-12-07 ENCOUNTER — Other Ambulatory Visit: Payer: Self-pay

## 2020-12-07 VITALS — BP 114/75 | HR 63

## 2020-12-07 DIAGNOSIS — M62838 Other muscle spasm: Secondary | ICD-10-CM

## 2020-12-07 DIAGNOSIS — G244 Idiopathic orofacial dystonia: Secondary | ICD-10-CM

## 2020-12-07 DIAGNOSIS — F458 Other somatoform disorders: Secondary | ICD-10-CM

## 2020-12-07 NOTE — Progress Notes (Signed)
  Last injections 09/12/2020  Response: She is much improved, feels functionality is better, decreased pain, increased range of motion of jaw, can eat without pain, can open her mouth/jaw wide.  Side effects none   reviewed w/ pt the procedure of botulinum toxin, incl side effects - localized weakness, inj site rxn, myalgia and spread from site of injection   Procedure note   EMG: EMG guidance was not used today to inject muscles detailed below but may be used in the future. Aseptic procedure was performed and patient tolerated procedure. Procedure was performed by Dr. Myrla Halsted   A 31 gauge needle was used to inject  Patient tolerated the procedure.    Refractory to oral medications  Adjusted dose today to optimize response   Motrin / tylenol for injections site pain / soreness   REMS precautions handout given to patient   RTC - see instructions for details   Dysport 300Units [id number] 120U bilaterally was placed in 5 locations in each masseter bilaterally.  Units Injected 240 , Units wasted 60 , Units billed 300   j code [id number] Lot [id number] Expiration date 05/16/2021 Facial injections [place], MD  [facility]  [address] [place]  [place], [place] [address]  Phone [phone] Fax [phone]

## 2020-12-13 DIAGNOSIS — G244 Idiopathic orofacial dystonia: Secondary | ICD-10-CM | POA: Insufficient documentation

## 2020-12-15 ENCOUNTER — Other Ambulatory Visit (HOSPITAL_COMMUNITY): Payer: Self-pay

## 2020-12-28 ENCOUNTER — Telehealth: Payer: Self-pay | Admitting: Neurology

## 2020-12-28 NOTE — Telephone Encounter (Signed)
Beverly Beam, MD  Allegra Lai; P Gna-Pod 4 Results  We will need her next appointment to order or use 500U of dysport (300 wasn't enough thanks)

## 2021-01-09 ENCOUNTER — Other Ambulatory Visit (HOSPITAL_COMMUNITY): Payer: Self-pay

## 2021-01-09 ENCOUNTER — Other Ambulatory Visit (INDEPENDENT_AMBULATORY_CARE_PROVIDER_SITE_OTHER): Payer: Self-pay | Admitting: Family Medicine

## 2021-01-09 MED ORDER — CEPHALEXIN 500 MG PO CAPS
500.0000 mg | ORAL_CAPSULE | Freq: Four times a day (QID) | ORAL | 1 refills | Status: DC
  Filled 2021-01-09: qty 40, 10d supply, fill #0

## 2021-02-16 ENCOUNTER — Other Ambulatory Visit (HOSPITAL_COMMUNITY): Payer: Self-pay

## 2021-02-16 ENCOUNTER — Other Ambulatory Visit: Payer: Self-pay | Admitting: Internal Medicine

## 2021-02-16 DIAGNOSIS — F411 Generalized anxiety disorder: Secondary | ICD-10-CM

## 2021-02-16 MED ORDER — SERTRALINE HCL 50 MG PO TABS
50.0000 mg | ORAL_TABLET | Freq: Every day | ORAL | 1 refills | Status: DC
  Filled 2021-02-16: qty 90, 90d supply, fill #0

## 2021-02-22 ENCOUNTER — Other Ambulatory Visit (HOSPITAL_COMMUNITY): Payer: Self-pay

## 2021-02-22 ENCOUNTER — Other Ambulatory Visit: Payer: Self-pay | Admitting: Internal Medicine

## 2021-02-22 DIAGNOSIS — F411 Generalized anxiety disorder: Secondary | ICD-10-CM

## 2021-02-22 MED ORDER — SERTRALINE HCL 50 MG PO TABS
75.0000 mg | ORAL_TABLET | Freq: Every day | ORAL | 1 refills | Status: DC
  Filled 2021-02-22 – 2021-02-26 (×2): qty 135, 90d supply, fill #0
  Filled 2021-05-24: qty 135, 90d supply, fill #1

## 2021-02-26 ENCOUNTER — Other Ambulatory Visit (HOSPITAL_COMMUNITY): Payer: Self-pay

## 2021-03-20 ENCOUNTER — Other Ambulatory Visit: Payer: Self-pay

## 2021-03-20 ENCOUNTER — Ambulatory Visit (INDEPENDENT_AMBULATORY_CARE_PROVIDER_SITE_OTHER): Payer: 59 | Admitting: Neurology

## 2021-03-20 DIAGNOSIS — G244 Idiopathic orofacial dystonia: Secondary | ICD-10-CM

## 2021-03-20 NOTE — Progress Notes (Signed)
Dysport 3MLx 1 vial (500 units) Lot: [id number] Expiration: 08/14/2021 NDC: [address]-3  Bacteriostatic 0.9% Sodium Chloride- 60mL total Lot: [id number] Expiration: 07/2021 NDC: 8478-4128-20  Dx G24.4 B/B [id number]

## 2021-03-20 NOTE — Telephone Encounter (Signed)
Patient has Botox appointment today. [facility] now requires PA for Dysport. I called [facility] @ [phone] and spoke with Amira.  She was able to initiate PA for CPT J0586/64612. PA is requesting 500 units of Dysport every 12 weeks for dx G24.4 & F45.8.  Amira advises that clinical information will need to be faxed to [phone] for review. The request is pending. Pending [phone].

## 2021-03-21 NOTE — Progress Notes (Signed)
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  Symptoms started over a year ago with pain in the jaw muscles, limited mouth opening to where she had significantly decreased intake and weight loss, also dysarthria and temporomandibular joint problems due to the symptoms.  They were impairing her daily activities with social embarrassment, inability to work, weight loss and forming a significant impact on the overall quality of life of the patient.  Inability to open her jaw limits the maximal mouth opening and hampers speech, mastication/eating.  Medications tried: Patient's been under the care of neurology, primary care, she has tried physical therapy, massage, chiropractic, analgesics, muscle relaxer such as Flexeril and baclofen which gave her significant side effects such as sedation.  These were tried over a 18-month period without reduction of pain or quality of life or range of motion.  Dysport was used, 167 units in 5 locations were injected into the left masseter, 167 units in 5 locations were injected into the right masseter and 50 units was injected in 1 location into the left pterygoid and 50 units was injected in 1 location into the right pterygoid.  434 units of Dysport was used and 66 units was discarded.  Patient tolerated the procedure.  There was no significant bleeding or pain.   Consent Form Botulism Toxin Injection For Chronic Migraine    Reviewed orally with patient, additionally signature is on file:  Botulism toxin has been approved by the Federal drug administration for treatment of dystonia.  The administration of botulism toxin is accomplished by injecting a small amount of toxin into the muscles of the neck and head. Dosage must be titrated for each individual. Any benefits resulting from botulism toxin tend to wear off after 3 months with a repeat injection  required if benefit is to be maintained. Injections are usually done every 3-4 months with maximum effect peak achieved by about 2 or 3 weeks. Botulism toxin is expensive and you should be sure of what costs you will incur resulting from the injection.  The side effects of botulism toxin use for chronic migraine may include:   -Transient, and usually mild, facial weakness with facial injections  -Transient, and usually mild, head or neck weakness with head/neck injections  -Reduction or loss of forehead facial animation due to forehead muscle weakness  -Eyelid drooping  -Dry eye  -Pain at the site of injection or bruising at the site of injection  -Double vision  -Potential unknown long term risks  Contraindications: You should not have Botox if you are pregnant, nursing, allergic to albumin, have an infection, skin condition, or muscle weakness at the site of the injection, or have myasthenia gravis, Lambert-Eaton syndrome, or ALS.  It is also possible that as with any injection, there may be an allergic reaction or no effect from the medication. Reduced effectiveness after repeated injections is sometimes seen and rarely infection at the injection site may occur. All care will be taken to prevent these side effects. If therapy is given over a long time, atrophy and wasting in the muscle injected may occur. Occasionally the patient's become refractory to treatment because they develop antibodies to the toxin. In this event, therapy needs to be modified.  I have read the above information and consent to the administration of botulism toxin.

## 2021-03-28 NOTE — Telephone Encounter (Signed)
Received approval from [facility]. PA #[id number] (03/20/21- 09/17/21).

## 2021-04-02 ENCOUNTER — Encounter (HOSPITAL_COMMUNITY): Payer: Self-pay | Admitting: Obstetrics & Gynecology

## 2021-04-02 ENCOUNTER — Inpatient Hospital Stay (HOSPITAL_COMMUNITY): Payer: 59

## 2021-04-02 ENCOUNTER — Inpatient Hospital Stay (HOSPITAL_COMMUNITY): Payer: 59 | Admitting: Anesthesiology

## 2021-04-02 ENCOUNTER — Inpatient Hospital Stay (HOSPITAL_COMMUNITY)
Admission: AD | Admit: 2021-04-02 | Discharge: 2021-04-02 | Disposition: A | Discharge status: Home / Self Care | Payer: 59 | Attending: Obstetrics & Gynecology | Admitting: Obstetrics & Gynecology

## 2021-04-02 ENCOUNTER — Other Ambulatory Visit (HOSPITAL_COMMUNITY): Payer: Self-pay

## 2021-04-02 ENCOUNTER — Other Ambulatory Visit: Payer: Self-pay

## 2021-04-02 ENCOUNTER — Encounter (HOSPITAL_COMMUNITY)
Admission: AD | Disposition: A | Discharge status: Home / Self Care | Payer: Self-pay | Source: Home / Self Care | Attending: Obstetrics & Gynecology

## 2021-04-02 DIAGNOSIS — Z975 Presence of (intrauterine) contraceptive device: Secondary | ICD-10-CM | POA: Insufficient documentation

## 2021-04-02 DIAGNOSIS — Z3201 Encounter for pregnancy test, result positive: Secondary | ICD-10-CM | POA: Diagnosis not present

## 2021-04-02 DIAGNOSIS — O00102 Left tubal pregnancy without intrauterine pregnancy: Secondary | ICD-10-CM | POA: Insufficient documentation

## 2021-04-02 DIAGNOSIS — O209 Hemorrhage in early pregnancy, unspecified: Secondary | ICD-10-CM | POA: Diagnosis not present

## 2021-04-02 DIAGNOSIS — Z8759 Personal history of other complications of pregnancy, childbirth and the puerperium: Secondary | ICD-10-CM | POA: Insufficient documentation

## 2021-04-02 DIAGNOSIS — O4690 Antepartum hemorrhage, unspecified, unspecified trimester: Secondary | ICD-10-CM

## 2021-04-02 DIAGNOSIS — Z98891 History of uterine scar from previous surgery: Secondary | ICD-10-CM | POA: Diagnosis not present

## 2021-04-02 DIAGNOSIS — Z3A00 Weeks of gestation of pregnancy not specified: Secondary | ICD-10-CM | POA: Insufficient documentation

## 2021-04-02 DIAGNOSIS — N939 Abnormal uterine and vaginal bleeding, unspecified: Secondary | ICD-10-CM | POA: Diagnosis not present

## 2021-04-02 HISTORY — PX: DIAGNOSTIC LAPAROSCOPY WITH REMOVAL OF ECTOPIC PREGNANCY: SHX6449

## 2021-04-02 LAB — COMPREHENSIVE METABOLIC PANEL
ALT: 20 U/L (ref 0–44)
AST: 26 U/L (ref 15–41)
Albumin: 3.7 g/dL (ref 3.5–5.0)
Alkaline Phosphatase: 83 U/L (ref 38–126)
Anion gap: 6 (ref 5–15)
BUN: 15 mg/dL (ref 6–20)
CO2: 24 mmol/L (ref 22–32)
Calcium: 8.8 mg/dL — ABNORMAL LOW (ref 8.9–10.3)
Chloride: 108 mmol/L (ref 98–111)
Creatinine, Ser: 0.98 mg/dL (ref 0.44–1.00)
GFR, Estimated: >60 mL/min (ref >60)
Glucose, Bld: 99 mg/dL (ref 70–99)
Potassium: 3.6 mmol/L (ref 3.5–5.1)
Sodium: 138 mmol/L (ref 135–145)
Total Bilirubin: 0.6 mg/dL (ref 0.3–1.2)
Total Protein: 6 g/dL — ABNORMAL LOW (ref 6.5–8.1)

## 2021-04-02 LAB — TYPE AND SCREEN
ABO/RH(D): O POS
Antibody Screen: NEGATIVE

## 2021-04-02 LAB — HCG, QUANTITATIVE, PREGNANCY: hCG, Beta Chain, Quant, S: 790 m[IU]/mL — ABNORMAL HIGH (ref <5)

## 2021-04-02 LAB — CBC
HCT: 34.9 % — ABNORMAL LOW (ref 36.0–46.0)
Hemoglobin: 11.9 g/dL — ABNORMAL LOW (ref 12.0–15.0)
MCH: 31.8 pg (ref 26.0–34.0)
MCHC: 34.1 g/dL (ref 30.0–36.0)
MCV: 93.3 fL (ref 80.0–100.0)
Platelets: 174 10*3/uL (ref 150–400)
RBC: 3.74 MIL/uL — ABNORMAL LOW (ref 3.87–5.11)
RDW: 12.4 % (ref 11.5–15.5)
WBC: 4.6 10*3/uL (ref 4.0–10.5)
nRBC: 0 % (ref 0.0–0.2)

## 2021-04-02 SURGERY — LAPAROSCOPY, WITH ECTOPIC PREGNANCY SURGICAL TREATMENT
Anesthesia: General | Laterality: Left

## 2021-04-02 MED ORDER — ONDANSETRON HCL 4 MG/2ML IJ SOLN
INTRAMUSCULAR | Status: DC | PRN
  Administered 2021-04-02: 4 mg via INTRAVENOUS

## 2021-04-02 MED ORDER — FENTANYL CITRATE (PF) 100 MCG/2ML IJ SOLN
INTRAMUSCULAR | Status: DC | PRN
  Administered 2021-04-02: 100 ug via INTRAVENOUS

## 2021-04-02 MED ORDER — ONDANSETRON 8 MG PO TBDP
8.0000 mg | ORAL_TABLET | Freq: Three times a day (TID) | ORAL | 0 refills | Status: DC | PRN
  Filled 2021-04-02: qty 8, 3d supply, fill #0

## 2021-04-02 MED ORDER — HYDROMORPHONE HCL 1 MG/ML IJ SOLN
1.0000 mg | INTRAMUSCULAR | Status: DC | PRN
Start: 2021-04-02 — End: 2021-04-02
  Administered 2021-04-02: 1 mg via INTRAVENOUS

## 2021-04-02 MED ORDER — FENTANYL CITRATE (PF) 250 MCG/5ML IJ SOLN
INTRAMUSCULAR | Status: AC
  Filled 2021-04-02: qty 5

## 2021-04-02 MED ORDER — PROPOFOL 10 MG/ML IV BOLUS
INTRAVENOUS | Status: DC | PRN
  Administered 2021-04-02: 110 mg via INTRAVENOUS

## 2021-04-02 MED ORDER — DEXAMETHASONE SODIUM PHOSPHATE 10 MG/ML IJ SOLN
INTRAMUSCULAR | Status: DC | PRN
  Administered 2021-04-02: 10 mg via INTRAVENOUS

## 2021-04-02 MED ORDER — ONDANSETRON HCL 4 MG/2ML IJ SOLN
4.0000 mg | Freq: Once | INTRAMUSCULAR | Status: AC
  Administered 2021-04-02: 4 mg via INTRAVENOUS
  Filled 2021-04-02: qty 2

## 2021-04-02 MED ORDER — KETOROLAC TROMETHAMINE 10 MG PO TABS
10.0000 mg | ORAL_TABLET | Freq: Three times a day (TID) | ORAL | 0 refills | Status: DC | PRN
  Filled 2021-04-02: qty 15, 5d supply, fill #0

## 2021-04-02 MED ORDER — LACTATED RINGERS IV BOLUS
1000.0000 mL | Freq: Once | INTRAVENOUS | Status: AC
  Administered 2021-04-02: 1000 mL via INTRAVENOUS

## 2021-04-02 MED ORDER — AMISULPRIDE (ANTIEMETIC) 5 MG/2ML IV SOLN
10.0000 mg | Freq: Once | INTRAVENOUS | Status: AC
  Administered 2021-04-02: 10 mg via INTRAVENOUS

## 2021-04-02 MED ORDER — MIDAZOLAM HCL 2 MG/2ML IJ SOLN
INTRAMUSCULAR | Status: AC
  Filled 2021-04-02: qty 2

## 2021-04-02 MED ORDER — CEFAZOLIN SODIUM-DEXTROSE 2-3 GM-%(50ML) IV SOLR
INTRAVENOUS | Status: DC | PRN
  Administered 2021-04-02: 2 g via INTRAVENOUS

## 2021-04-02 MED ORDER — SODIUM CHLORIDE 0.9 % IR SOLN
Status: DC | PRN
  Administered 2021-04-02: 3000 mL

## 2021-04-02 MED ORDER — LACTATED RINGERS IV SOLN: INTRAVENOUS | Status: DC | PRN

## 2021-04-02 MED ORDER — LACTATED RINGERS IV SOLN: INTRAVENOUS | Status: DC

## 2021-04-02 MED ORDER — GLYCOPYRROLATE 0.2 MG/ML IJ SOLN
INTRAMUSCULAR | Status: DC | PRN
  Administered 2021-04-02: .2 mg via INTRAVENOUS

## 2021-04-02 MED ORDER — PROPOFOL 10 MG/ML IV BOLUS
INTRAVENOUS | Status: AC
  Filled 2021-04-02: qty 20

## 2021-04-02 MED ORDER — BUPIVACAINE LIPOSOME 1.3 % IJ SUSP
20.0000 mL | Freq: Once | INTRAMUSCULAR | Status: AC
  Administered 2021-04-02: 20 mL
  Filled 2021-04-02: qty 20

## 2021-04-02 MED ORDER — AMISULPRIDE (ANTIEMETIC) 5 MG/2ML IV SOLN
INTRAVENOUS | Status: AC
  Filled 2021-04-02: qty 4

## 2021-04-02 MED ORDER — KETOROLAC TROMETHAMINE 30 MG/ML IJ SOLN
INTRAMUSCULAR | Status: DC | PRN
  Administered 2021-04-02: 30 mg via INTRAVENOUS

## 2021-04-02 MED ORDER — FENTANYL CITRATE (PF) 100 MCG/2ML IJ SOLN: 25.0000 ug | INTRAMUSCULAR | Status: DC | PRN

## 2021-04-02 MED ORDER — LIDOCAINE 2% (20 MG/ML) 5 ML SYRINGE
INTRAMUSCULAR | Status: DC | PRN
  Administered 2021-04-02: 50 mg via INTRAVENOUS

## 2021-04-02 MED ORDER — ACETAMINOPHEN 10 MG/ML IV SOLN: 1000.0000 mg | Freq: Once | INTRAVENOUS | Status: DC | PRN

## 2021-04-02 MED ORDER — SUCCINYLCHOLINE CHLORIDE 200 MG/10ML IV SOSY
PREFILLED_SYRINGE | INTRAVENOUS | Status: DC | PRN
  Administered 2021-04-02: 100 mg via INTRAVENOUS

## 2021-04-02 MED ORDER — EPHEDRINE SULFATE 50 MG/ML IJ SOLN
INTRAMUSCULAR | Status: DC | PRN
  Administered 2021-04-02 (×2): 5 mg via INTRAVENOUS

## 2021-04-02 MED ORDER — ROCURONIUM BROMIDE 10 MG/ML (PF) SYRINGE
PREFILLED_SYRINGE | INTRAVENOUS | Status: DC | PRN
Start: 2021-04-02 — End: 2021-04-02
  Administered 2021-04-02: 5 mg via INTRAVENOUS
  Administered 2021-04-02: 40 mg via INTRAVENOUS

## 2021-04-02 MED ORDER — HYDROCODONE-ACETAMINOPHEN 5-325 MG PO TABS
1.0000 | ORAL_TABLET | Freq: Four times a day (QID) | ORAL | 0 refills | Status: DC | PRN
  Filled 2021-04-02: qty 15, 4d supply, fill #0

## 2021-04-02 MED ORDER — SUGAMMADEX SODIUM 200 MG/2ML IV SOLN
INTRAVENOUS | Status: DC | PRN
  Administered 2021-04-02: 200 mg via INTRAVENOUS

## 2021-04-02 MED ORDER — HYDROMORPHONE HCL 1 MG/ML IJ SOLN
INTRAMUSCULAR | Status: AC
  Filled 2021-04-02: qty 1

## 2021-04-02 MED ORDER — MIDAZOLAM HCL 5 MG/5ML IJ SOLN
INTRAMUSCULAR | Status: DC | PRN
Start: 2021-04-02 — End: 2021-04-02
  Administered 2021-04-02: 2 mg via INTRAVENOUS

## 2021-04-02 SURGICAL SUPPLY — 35 items
BARRIER ADHS 3X4 INTERCEED (GAUZE/BANDAGES/DRESSINGS) IMPLANT
COVER MAYO STAND STRL (DRAPES) ×2 IMPLANT
DECANTER SPIKE VIAL GLASS SM (MISCELLANEOUS) ×2 IMPLANT
DERMABOND ADVANCED (GAUZE/BANDAGES/DRESSINGS) ×1
DERMABOND ADVANCED .7 DNX12 (GAUZE/BANDAGES/DRESSINGS) ×1 IMPLANT
DRSG OPSITE POSTOP 3X4 (GAUZE/BANDAGES/DRESSINGS) ×2 IMPLANT
ELECT REM PT RETURN 9FT ADLT (ELECTROSURGICAL) ×2
ELECTRODE REM PT RTRN 9FT ADLT (ELECTROSURGICAL) ×1 IMPLANT
FILTER SMOKE EVAC LAPAROSHD (FILTER) IMPLANT
GLOVE SRG 8 PF TXTR STRL LF DI (GLOVE) ×1 IMPLANT
GLOVE SURG LTX SZ8 (GLOVE) ×2 IMPLANT
GLOVE SURG UNDER POLY LF SZ7 (GLOVE) ×2 IMPLANT
GLOVE SURG UNDER POLY LF SZ8 (GLOVE) ×1
GOWN STRL REUS W/ TWL LRG LVL3 (GOWN DISPOSABLE) ×2 IMPLANT
GOWN STRL REUS W/TWL LRG LVL3 (GOWN DISPOSABLE) ×2
IRRIGATION STRYKERFLOW (MISCELLANEOUS) ×1 IMPLANT
IRRIGATOR STRYKERFLOW (MISCELLANEOUS) ×2
NEEDLE INSUFFLATION 14GA 120MM (NEEDLE) ×2 IMPLANT
PACK LAPAROSCOPY BASIN (CUSTOM PROCEDURE TRAY) ×2 IMPLANT
PACK TRENDGUARD 450 HYBRID PRO (MISCELLANEOUS) ×1 IMPLANT
POUCH SPECIMEN RETRIEVAL 10MM (ENDOMECHANICALS) IMPLANT
PROTECTOR NERVE ULNAR (MISCELLANEOUS) ×4 IMPLANT
SHEARS HARMONIC ACE PLUS 36CM (ENDOMECHANICALS) ×2 IMPLANT
SLEEVE ENDOPATH XCEL 5M (ENDOMECHANICALS) ×2 IMPLANT
STAPLER VISISTAT 35W (STAPLE) IMPLANT
SUT VIC AB 3-0 FS2 27 (SUTURE) IMPLANT
SUT VIC AB 3-0 SH 27 (SUTURE)
SUT VIC AB 3-0 SH 27X BRD (SUTURE) IMPLANT
SUT VICRYL 0 UR6 27IN ABS (SUTURE) ×2 IMPLANT
TOWEL GREEN STERILE FF (TOWEL DISPOSABLE) ×4 IMPLANT
TRAY FOLEY W/BAG SLVR 14FR (SET/KITS/TRAYS/PACK) ×2 IMPLANT
TRENDGUARD 450 HYBRID PRO PACK (MISCELLANEOUS) ×2
TROCAR BALLN 12MMX100 BLUNT (TROCAR) IMPLANT
TROCAR XCEL NON-BLD 11X100MML (ENDOMECHANICALS) ×2 IMPLANT
TROCAR XCEL NON-BLD 5MMX100MML (ENDOMECHANICALS) ×2 IMPLANT

## 2021-04-02 NOTE — MAU Provider Note (Signed)
History     CSN: [id number]  Arrival date and time: 04/02/21 0234   Event Date/Time   First Provider Initiated Contact with Patient 04/02/21 0247      Chief Complaint  Patient presents with   Abdominal Pain   35 y.o. [id number] presenting with severe LAP in the setting of +HPT and IUD in place. Reports cramping started yesterday so she took 3 HPT which were all positive. Cramping became worse early this am. Pain is central. Denied VB initially then during intake stated "something is happening", and dark red blood was seen at introitus. Mirena IUD was placed in April. Reports frequent VB since IUD was placed but not regular cycles.   OB History     Gravida  2   Para  2   Term  1   Preterm  1   AB      Living  2      SAB      IAB      Ectopic      Multiple  0   Live Births  2           Past Medical History:  Diagnosis Date   Anxiety    Severe preeclampsia, third trimester 11/18/2016   Vaginal Pap smear, abnormal     Past Surgical History:  Procedure Laterality Date   CESAREAN SECTION N/A 11/20/2016   Procedure: CESAREAN SECTION;  Surgeon: Janyth Pupa, DO;  Location: [facility];  Service: Obstetrics;  Laterality: N/A;   COLONOSCOPY     TONSILLECTOMY     WISDOM TOOTH EXTRACTION      Family History  Problem Relation Age of Onset   Hypertension Mother    Diabetes Father    Hypertension Father    Heart disease Maternal Grandmother    Heart disease Maternal Grandfather    Other Maternal Grandfather        covid   Stroke Maternal Grandfather    Diabetes Paternal Grandfather    Heart disease Paternal Grandfather    Stroke Paternal Grandfather    Heart attack Paternal Grandfather    Factor V Leiden deficiency Paternal Grandmother     Social History   Tobacco Use   Smoking status: Never   Smokeless tobacco: Never  Vaping Use   Vaping Use: Never used  Substance Use Topics   Alcohol use: No   Drug use: No    Allergies: No Known  Allergies  Medications Prior to Admission  Medication Sig Dispense Refill Last Dose   cetirizine (ZYRTEC) 10 MG tablet Take 10 mg by mouth daily.      COVID-19 At Home Antigen Test [facility] COVID-19 HOME TEST) KIT Use as directed. 4 each 0    FLUCELVAX QUADRIVALENT 0.5 ML injection       fluticasone (FLONASE) 50 MCG/ACT nasal spray Place into both nostrils daily.      Prenatal Vit-Fe Fumarate-FA (PRENATAL PO) Take 1 tablet by mouth daily.      sertraline (ZOLOFT) 50 MG tablet Take 1&1/2 tablets (75 mg total) by mouth daily. 135 tablet 1    VITAMIN D PO Take by mouth.       Review of Systems  Constitutional:  Negative for fever.  Gastrointestinal:  Positive for abdominal pain and nausea. Negative for vomiting.  Genitourinary:  Negative for vaginal discharge.  Neurological:  Negative for syncope and light-headedness.  Physical Exam   Blood pressure 135/75, pulse 93, temperature 97.6 F (36.4 C), temperature source Oral, resp. rate 20,  currently breastfeeding.  Physical Exam Vitals and nursing note reviewed. Exam conducted with a chaperone present.  Constitutional:      General: She is in acute distress.     Appearance: Normal appearance.  HENT:     Head: Normocephalic and atraumatic.  Pulmonary:     Effort: Pulmonary effort is normal. No respiratory distress.  Genitourinary:    Comments: External: no lesions or erythema Vagina: rugated, pink, moist, small amt dark red bloody discharge, cleared with 2 fox swabs, IUD strings seen, approx 3cm Uterus: + enlarged, anteverted, + tender, no CMT Adnexae: no masses, + tenderness left, no tenderness right Cervix closed   Musculoskeletal:        General: Normal range of motion.     Cervical back: Normal range of motion.  Skin:    General: Skin is warm and dry.  Neurological:     General: No focal deficit present.     Mental Status: She is alert and oriented to person, place, and time.  Psychiatric:        Mood and Affect: Mood  is anxious.        Behavior: Behavior normal.   Results for orders placed or performed during the hospital encounter of 04/02/21 (from the past 24 hour(s))  Type and screen     Status: None   Collection Time: 04/02/21  2:55 AM  Result Value Ref Range   ABO/RH(D) O POS    Antibody Screen NEG    Sample Expiration      04/05/2021,2359 Performed at [facility] [address] [place], [place] [address]   CBC     Status: Abnormal   Collection Time: 04/02/21  2:59 AM  Result Value Ref Range   WBC 4.6 4.0 - 10.5 K/uL   RBC 3.74 (L) 3.87 - 5.11 MIL/uL   Hemoglobin 11.9 (L) 12.0 - 15.0 g/dL   HCT 34.9 (L) 36.0 - 46.0 %   MCV 93.3 80.0 - 100.0 fL   MCH 31.8 26.0 - 34.0 pg   MCHC 34.1 30.0 - 36.0 g/dL   RDW 12.4 11.5 - 15.5 %   Platelets 174 150 - 400 K/uL   nRBC 0.0 0.0 - 0.2 %  Comprehensive metabolic panel     Status: Abnormal   Collection Time: 04/02/21  2:59 AM  Result Value Ref Range   Sodium 138 135 - 145 mmol/L   Potassium 3.6 3.5 - 5.1 mmol/L   Chloride 108 98 - 111 mmol/L   CO2 24 22 - 32 mmol/L   Glucose, Bld 99 70 - 99 mg/dL   BUN 15 6 - 20 mg/dL   Creatinine, Ser 0.98 0.44 - 1.00 mg/dL   Calcium 8.8 (L) 8.9 - 10.3 mg/dL   Total Protein 6.0 (L) 6.5 - 8.1 g/dL   Albumin 3.7 3.5 - 5.0 g/dL   AST 26 15 - 41 U/L   ALT 20 0 - 44 U/L   Alkaline Phosphatase 83 38 - 126 U/L   Total Bilirubin 0.6 0.3 - 1.2 mg/dL   GFR, Estimated >60 >60 mL/min   Anion gap 6 5 - 15  hCG, quantitative, pregnancy     Status: Abnormal   Collection Time: 04/02/21  2:59 AM  Result Value Ref Range   hCG, Beta Chain, Quant, S 790 (H) <5 mIU/mL   US OB LESS THAN 14 WEEKS WITH OB TRANSVAGINAL  Result Date: 04/02/2021 CLINICAL DATA:  Pregnant, vaginal bleeding, IUD.  Beta HCG 790. EXAM: OBSTETRIC <  14 WK [place] AND TRANSVAGINAL OB US TECHNIQUE: Both transabdominal and transvaginal ultrasound examinations were performed for complete evaluation of the gestation as well as the maternal uterus,  adnexal regions, and pelvic cul-de-sac. Transvaginal technique was performed to assess early pregnancy. COMPARISON:  None. FINDINGS: Intrauterine gestational sac: None Maternal uterus/adnexae: IUD in satisfactory position. Mild fluid in the endometrial fundus, mobile, not favoring a true gestational sac. Right ovary is within normal limits, measuring 3.2 x 3.6 x 1.9 cm. Left ovary measures 3.0 x 1.6 x 2.6 cm. Adjacent hemorrhage/debris is suspected in the left adnexa. In the setting of a positive pregnancy test, this suggests sentinel clot related to nonvisualized ectopic pregnancy within the left fallopian tube. Moderate pelvic ascites. IMPRESSION: No IUP is visualized.  IUD in satisfactory position. Hemorrhage/debris in the left adnexa, adjacent to the left ovary, suspicious for sentinel clot related to nonvisualized ectopic pregnancy within the left fallopian tube. Critical Value/emergent results were called by telephone at the time of interpretation on 04/02/2021 at 4:05 am to provider [facility] , who verbally acknowledged these results. Electronically Signed   By: Julian Hy M.D.   On: 04/02/2021 04:07    MAU Course  Procedures LR Dilaudid 20m  MDM Records reviewed: Hx of CS for HELLP at 32 wks. 0410: Dr. EElonda Huskynotified of presentation and clinical findings. 0430: Dr. EElonda Huskyat bedside to discuss POC. Assessment and Plan  Ruptured ectopic pregnancy Admit to OR Mngt per Dr. EJaclyn Shaggy CNM 04/02/2021, 4:41 AM

## 2021-04-02 NOTE — MAU Note (Signed)
Pt presents to MAU with c/o severe abdominal pain.  Pt reports that she had a Mirena IUD inserted in April 2022.  Pt states that she had three positive pregnancy test at home.

## 2021-04-02 NOTE — Anesthesia Procedure Notes (Signed)
Procedure Name: Intubation Date/Time: 04/02/2021 5:46 AM Performed by: Suzy Bouchard, CRNA Pre-anesthesia Checklist: Patient identified, Emergency Drugs available, Suction available, Patient being monitored and Timeout performed Patient Re-evaluated:Patient Re-evaluated prior to induction Oxygen Delivery Method: Circle system utilized Preoxygenation: Pre-oxygenation with 100% oxygen Induction Type: IV induction and Rapid sequence Laryngoscope Size: Miller and 2 Grade View: Grade II Tube type: Oral Tube size: 7.0 mm Number of attempts: 1 Airway Equipment and Method: Stylet Placement Confirmation: ETT inserted through vocal cords under direct vision, positive ETCO2 and breath sounds checked- equal and bilateral Secured at: 22 cm Tube secured with: Tape Dental Injury: Teeth and Oropharynx as per pre-operative assessment

## 2021-04-02 NOTE — Transfer of Care (Signed)
Immediate Anesthesia Transfer of Care Note  Patient: Beverly Gonzalez  Procedure(s) Performed: DIAGNOSTIC LAPAROSCOPY, EVACUATION OF HEMOPERITONEUM (Left)  Patient Location: PACU  Anesthesia Type:General  Level of Consciousness: awake and alert   Airway & Oxygen Therapy: Patient Spontanous Breathing and Patient connected to nasal cannula oxygen  Post-op Assessment: Report given to RN and Post -op Vital signs reviewed and stable  Post vital signs: Reviewed and stable  Last Vitals:  Vitals Value Taken Time  BP 104/67 04/02/21 0711  Temp    Pulse 89 04/02/21 0714  Resp 21 04/02/21 0714  SpO2 100 % 04/02/21 0714  Vitals shown include unvalidated device data.  Last Pain:  Vitals:   04/02/21 0351  TempSrc:   PainSc: Asleep         Complications: No notable events documented.

## 2021-04-02 NOTE — Anesthesia Preprocedure Evaluation (Addendum)
Anesthesia Evaluation  Patient identified by MRN, date of birth, ID band Patient awake    Reviewed: Allergy & Precautions, NPO status , Patient's Chart, lab work & pertinent test results  Airway Mallampati: II  TM Distance: >3 FB Neck ROM: Full    Dental  (+) Teeth Intact   Pulmonary neg pulmonary ROS,    Pulmonary exam normal        Cardiovascular hypertension,  Rhythm:Regular Rate:Normal     Neuro/Psych Anxiety negative neurological ROS     GI/Hepatic negative GI ROS, Neg liver ROS,   Endo/Other  negative endocrine ROS  Renal/GU negative Renal ROS     Musculoskeletal   Abdominal (+)  Abdomen: soft.    Peds  Hematology negative hematology ROS (+)   Anesthesia Other Findings   Reproductive/Obstetrics (+) Breast feeding  Ruptured ectopic                             Anesthesia Physical Anesthesia Plan  ASA: 2 and emergent  Anesthesia Plan: General   Post-op Pain Management:    Induction: Intravenous and Rapid sequence  PONV Risk Score and Plan: 3 and Ondansetron, Dexamethasone, Midazolam and Treatment may vary due to age or medical condition  Airway Management Planned: Mask and Oral ETT  Additional Equipment: None  Intra-op Plan:   Post-operative Plan: Extubation in OR  Informed Consent: I have reviewed the patients History and Physical, chart, labs and discussed the procedure including the risks, benefits and alternatives for the proposed anesthesia with the patient or authorized representative who has indicated his/her understanding and acceptance.     Dental advisory given  Plan Discussed with: CRNA  Anesthesia Plan Comments: (Lab Results      Component                Value               Date                      WBC                      4.6                 04/02/2021                HGB                      11.9 (L)            04/02/2021                HCT                       34.9 (L)            04/02/2021                MCV                      93.3                04/02/2021                PLT                      174  04/02/2021           Lab Results      Component                Value               Date                      NA                       138                 04/02/2021                K                        3.6                 04/02/2021                CO2                      24                  04/02/2021                GLUCOSE                  99                  04/02/2021                BUN                      15                  04/02/2021                CREATININE               0.98                04/02/2021                CALCIUM                  8.8 (L)             04/02/2021                GFRNONAA                 >60                 04/02/2021          )        Anesthesia Quick Evaluation

## 2021-04-02 NOTE — Op Note (Signed)
Preoperative diagnosis: 1.  Left ectopic pregnancy, presumed, ruptured                                        2.  IUD in place  Postoperative diagnosis: left ectopic pregnancy, aborted out of the tube, no tubal rupture, 250cc hemoperitoneum  Procedure: Laparoscopic management of left ectopic pregnancy   Surgeon: Florian Buff   Anesthesia: Gen. Endotracheal   Findings: pt noted to have hemoperiotneum with possible rupture of left Fallopian tube.    Intraoperatively the patient had about 250 cc no tubal rupture, dilated left tube, consistent with aborted ectopic out of the end of the tube   Description of operation: Patient was taken to the operating room and placed in the supine position where she underwent general endotracheal anesthesia. She was placed in dorsal lithotomy position. She was prepped and draped in usual sterile fashion. Foley catheter was placed. Incision was made above the umbilicus and a varies needle was placed peritoneal cavity with one pass that difficulty. The peritoneal cavity was insufflated. A 1011 non-bladed trocar was placed using a video laparoscope under direct visualization without difficulty. An incision was made in the midline suprapubic area and left lower quadrant and 5 mm non-bladed trochars were placed in each site without difficulty under direct visualization. There was a significant amount of blood in the belly the time of surgery and I estimated at 250 cc judging by what I was able to suction out.    The fallopian tube had not been ruptured. There was good hemostasis. The pelvis was irrigated and hemostasis once again confirmed. The right fallopian tube was completely normal and there were no other intraperitoneal abnormalities appreciated. The left tube was dilated and hyperemic but there was no rupture and no bulging, my impression is the pregnancy was aborted out the end of the tube  The trochars were removed and the gas was allowed to escape from the  abdomen. The 25 mm trocar sites were closed with staples and injected with a total of 10 cc of exparel. The umbilical fascia was closed with single 2-0 Vicryl suture and the subcutaneous tissue was also closed using Vicryl. The skin was closed using skin staples. 10 cc of expael was injected here as well.   The patient remained hemodynamically stable throughout the entire procedure was awakened from anesthesia and taken to the recovery room in good stable condition with all counts being correct.   She received 2 g of Ancef and 30 mg of Toradol prophylactically. There was no real intraoperative blood loss only the hemoperitoneum which was appreciated and present upon peritoneal entry.      Florian Buff, MD 04/02/2021 6:53 AM

## 2021-04-02 NOTE — Progress Notes (Signed)
Transfer to OR via stretcher

## 2021-04-02 NOTE — H&P (Signed)
**Note Beverly-Identified via Obfuscation** Preoperative History and Physical  Beverly Gonzalez is a 35 y.o. [id number] with No LMP recorded. admitted for a laparoscopic management of a ruptured ectopic pregnancy, presumed left side. HCG 790 and sonogram reveals moderate free fluid with debris in the left adnexa, IUD in proper location    35 y.o. [id number] presenting with severe LAP in the setting of +HPT and IUD in place. Reports cramping started yesterday so she took 3 HPT which were all positive. Cramping became worse early this am. Pain is central. Denied VB initially then during intake stated "something is happening", and dark red blood was seen at introitus. Mirena IUD was placed in April. Reports frequent VB since IUD was placed.  PMH:    Past Medical History:  Diagnosis Date   Anxiety    Severe preeclampsia, third trimester 11/18/2016   Vaginal Pap smear, abnormal     PSH:     Past Surgical History:  Procedure Laterality Date   CESAREAN SECTION N/A 11/20/2016   Procedure: CESAREAN SECTION;  Surgeon: Janyth Pupa, DO;  Location: [facility];  Service: Obstetrics;  Laterality: N/A;   COLONOSCOPY     TONSILLECTOMY     WISDOM TOOTH EXTRACTION      POb/GynH:      OB History     Gravida  2   Para  2   Term  1   Preterm  1   AB      Living  2      SAB      IAB      Ectopic      Multiple  0   Live Births  2           SH:   Social History   Tobacco Use   Smoking status: Never   Smokeless tobacco: Never  Vaping Use   Vaping Use: Never used  Substance Use Topics   Alcohol use: No   Drug use: No    FH:    Family History  Problem Relation Age of Onset   Hypertension Mother    Diabetes Father    Hypertension Father    Heart disease Maternal Grandmother    Heart disease Maternal Grandfather    Other Maternal Grandfather        covid   Stroke Maternal Grandfather    Diabetes Paternal Grandfather    Heart disease Paternal Grandfather    Stroke Paternal Grandfather    Heart attack  Paternal Grandfather    Factor V Leiden deficiency Paternal Grandmother      Allergies: No Known Allergies  Medications:       Current Facility-Administered Medications:    HYDROmorphone (DILAUDID) injection 1 mg, 1 mg, Intravenous, Q2H PRN, Gonzalez, Beverly, CNM, 1 mg at 04/02/21 0303  Review of Systems:   Review of Systems  Constitutional: Negative for fever, chills, weight loss, malaise/fatigue and diaphoresis.      PHYSICAL EXAM:  Blood pressure 135/75, pulse 93, temperature 97.6 F (36.4 C), temperature source Oral, resp. rate 20, currently breastfeeding.    Vitals reviewed. Constitutional: She is oriented to person, place, and time. Cardiovascular: Normal rate, regular rhythm, normal heart sounds and intact distal pulses.  Exam reveals no gallop and no friction rub.   No murmur heard. Respiratory: Effort normal and breath sounds normal. No respiratory distress.  GI: Soft. Bowel sounds are normal. She exhibits no distension and no mass. There is tenderness. There is no rebound and no guarding.  Genitourinary:  Per sonogram  Neurological: She is alert and oriented to person, place, and time Psychiatric: She has a normal mood and affect. Her behavior is normal. Judgment and thought content normal.    Labs: Results for orders placed or performed during the hospital encounter of 04/02/21 (from the past 336 hour(s))  Type and screen   Collection Time: 04/02/21  2:55 AM  Result Value Ref Range   ABO/RH(D) O POS    Antibody Screen NEG    Sample Expiration      04/05/2021,2359 Performed at [facility] [address] [place], [place] [address]   CBC   Collection Time: 04/02/21  2:59 AM  Result Value Ref Range   WBC 4.6 4.0 - 10.5 K/uL   RBC 3.74 (L) 3.87 - 5.11 MIL/uL   Hemoglobin 11.9 (L) 12.0 - 15.0 g/dL   HCT 34.9 (L) 36.0 - 46.0 %   MCV 93.3 80.0 - 100.0 fL   MCH 31.8 26.0 - 34.0 pg   MCHC 34.1 30.0 - 36.0 g/dL   RDW 12.4 11.5 - 15.5 %    Platelets 174 150 - 400 K/uL   nRBC 0.0 0.0 - 0.2 %  Comprehensive metabolic panel   Collection Time: 04/02/21  2:59 AM  Result Value Ref Range   Sodium 138 135 - 145 mmol/L   Potassium 3.6 3.5 - 5.1 mmol/L   Chloride 108 98 - 111 mmol/L   CO2 24 22 - 32 mmol/L   Glucose, Bld 99 70 - 99 mg/dL   BUN 15 6 - 20 mg/dL   Creatinine, Ser 0.98 0.44 - 1.00 mg/dL   Calcium 8.8 (L) 8.9 - 10.3 mg/dL   Total Protein 6.0 (L) 6.5 - 8.1 g/dL   Albumin 3.7 3.5 - 5.0 g/dL   AST 26 15 - 41 U/L   ALT 20 0 - 44 U/L   Alkaline Phosphatase 83 38 - 126 U/L   Total Bilirubin 0.6 0.3 - 1.2 mg/dL   GFR, Estimated >60 >60 mL/min   Anion gap 6 5 - 15  hCG, quantitative, pregnancy   Collection Time: 04/02/21  2:59 AM  Result Value Ref Range   hCG, Beta Chain, Quant, S 790 (H) <5 mIU/mL    EKG: No orders found for this or any previous visit.  Imaging Studies: US OB LESS THAN 14 WEEKS WITH OB TRANSVAGINAL  Result Date: 04/02/2021 CLINICAL DATA:  Pregnant, vaginal bleeding, IUD.  Beta HCG 790. EXAM: OBSTETRIC <14 WK [place] AND TRANSVAGINAL OB US TECHNIQUE: Both transabdominal and transvaginal ultrasound examinations were performed for complete evaluation of the gestation as well as the maternal uterus, adnexal regions, and pelvic cul-Beverly-sac. Transvaginal technique was performed to assess early pregnancy. COMPARISON:  None. FINDINGS: Intrauterine gestational sac: None Maternal uterus/adnexae: IUD in satisfactory position. Mild fluid in the endometrial fundus, mobile, not favoring a true gestational sac. Right ovary is within normal limits, measuring 3.2 x 3.6 x 1.9 cm. Left ovary measures 3.0 x 1.6 x 2.6 cm. Adjacent hemorrhage/debris is suspected in the left adnexa. In the setting of a positive pregnancy test, this suggests sentinel clot related to nonvisualized ectopic pregnancy within the left fallopian tube. Moderate pelvic ascites. IMPRESSION: No IUP is visualized.  IUD in satisfactory position. Hemorrhage/debris  in the left adnexa, adjacent to the left ovary, suspicious for sentinel clot related to nonvisualized ectopic pregnancy within the left fallopian tube. Critical Value/emergent results were called by telephone at the time of interpretation on 04/02/2021 at 4:05 am to provider [facility]  Gonzalez , who verbally acknowledged these results. Electronically Signed   By: Julian Hy M.D.   On: 04/02/2021 04:07      Assessment: Ruptured ectopic pregnancy, presumed left  Plan: Laparoscopic management of ectopic pregnancy, presumed left, probable left salpingectomy  Beverly Gonzalez 04/02/2021 4:33 AM

## 2021-04-03 ENCOUNTER — Encounter (HOSPITAL_COMMUNITY): Payer: Self-pay | Admitting: Obstetrics & Gynecology

## 2021-04-04 NOTE — Anesthesia Postprocedure Evaluation (Signed)
Anesthesia Post Note  Patient: Beverly Gonzalez  Procedure(s) Performed: DIAGNOSTIC LAPAROSCOPY, EVACUATION OF HEMOPERITONEUM (Left)     Patient location during evaluation: PACU Anesthesia Type: General Level of consciousness: awake and alert Pain management: pain level controlled Vital Signs Assessment: post-procedure vital signs reviewed and stable Respiratory status: spontaneous breathing, nonlabored ventilation, respiratory function stable and patient connected to nasal cannula oxygen Cardiovascular status: blood pressure returned to baseline and stable Postop Assessment: no apparent nausea or vomiting Anesthetic complications: no   No notable events documented.  Last Vitals:  Vitals:   04/02/21 0810 04/02/21 0825  BP: 104/62 105/65  Pulse: 72 78  Resp: 16 17  Temp:    SpO2: 99% 100%    Last Pain:  Vitals:   04/02/21 0740  TempSrc:   PainSc: Asleep                 March Rummage Celedonio Sortino

## 2021-04-05 ENCOUNTER — Other Ambulatory Visit: Payer: Self-pay

## 2021-04-05 ENCOUNTER — Ambulatory Visit (INDEPENDENT_AMBULATORY_CARE_PROVIDER_SITE_OTHER): Payer: 59 | Admitting: Obstetrics & Gynecology

## 2021-04-05 ENCOUNTER — Other Ambulatory Visit: Payer: 59

## 2021-04-05 ENCOUNTER — Encounter: Payer: Self-pay | Admitting: Obstetrics & Gynecology

## 2021-04-05 VITALS — BP 126/77 | HR 61 | Ht 70.0 in | Wt 154.6 lb

## 2021-04-05 DIAGNOSIS — O4690 Antepartum hemorrhage, unspecified, unspecified trimester: Secondary | ICD-10-CM | POA: Diagnosis not present

## 2021-04-05 DIAGNOSIS — O00102 Left tubal pregnancy without intrauterine pregnancy: Secondary | ICD-10-CM

## 2021-04-05 DIAGNOSIS — Z4889 Encounter for other specified surgical aftercare: Secondary | ICD-10-CM

## 2021-04-05 NOTE — Progress Notes (Signed)
    [facility] Partum Visit Note  Beverly Gonzalez is a 35 y.o. [id number] female who presents for a postoperative visit. She is 1 week postop following Diagnostic laparoscopic, evacuation of hemoperitoneum due to ectopic pregnancy  Today she notes that she is super sore, but otherwise doing ok. Denies fever or chills.  Tolerating gen diet.  +Flatus, Regular BMs. Overall doing well and reports no acute complaints   Objective:  BP 126/77 (BP Location: Right Arm, Patient Position: Sitting, Cuff Size: Normal)   Pulse 61   Ht 5\' 10"  (1.778 m)   Wt 154 lb 9.6 oz (70.1 kg)   BMI 22.18 kg/m    Physical Examination:  GENERAL ASSESSMENT: well developed and well nourished SKIN: warm and dry CHEST: normal air exchange, respiratory effort normal with no retractions ABDOMEN: soft, non-distended, appropriately tender INCISION: small ecchymosis noted around umbilical incision, all 3 port incisions with dermabond- healing appropriately EXTREMITY: normal range of motion PSYCH: mood appropriate, normal affect       Assessment:    Postop incision check   Plan:   Meeting milestones appropriately []  plan for HCG follow up Reviewed postop recovery including pelvic rest for at least another 2 wks  Janyth Pupa, DO Attending [place], [place] for [employer], [place]

## 2021-04-06 LAB — BETA HCG QUANT (REF LAB): hCG Quant: 271 m[IU]/mL

## 2021-04-12 ENCOUNTER — Other Ambulatory Visit: Payer: Self-pay

## 2021-04-12 ENCOUNTER — Other Ambulatory Visit: Payer: Self-pay | Admitting: Obstetrics & Gynecology

## 2021-04-12 DIAGNOSIS — O00102 Left tubal pregnancy without intrauterine pregnancy: Secondary | ICD-10-CM | POA: Diagnosis not present

## 2021-04-13 ENCOUNTER — Other Ambulatory Visit: Payer: Self-pay

## 2021-04-13 ENCOUNTER — Inpatient Hospital Stay (HOSPITAL_COMMUNITY)
Admission: AD | Admit: 2021-04-13 | Discharge: 2021-04-13 | Disposition: A | Discharge status: Home / Self Care | Payer: 59 | Attending: Obstetrics & Gynecology | Admitting: Obstetrics & Gynecology

## 2021-04-13 ENCOUNTER — Encounter (HOSPITAL_COMMUNITY): Payer: Self-pay | Admitting: *Deleted

## 2021-04-13 DIAGNOSIS — Z8759 Personal history of other complications of pregnancy, childbirth and the puerperium: Secondary | ICD-10-CM | POA: Insufficient documentation

## 2021-04-13 DIAGNOSIS — Z3A00 Weeks of gestation of pregnancy not specified: Secondary | ICD-10-CM | POA: Insufficient documentation

## 2021-04-13 DIAGNOSIS — O00109 Unspecified tubal pregnancy without intrauterine pregnancy: Secondary | ICD-10-CM | POA: Diagnosis not present

## 2021-04-13 DIAGNOSIS — Z98891 History of uterine scar from previous surgery: Secondary | ICD-10-CM | POA: Diagnosis not present

## 2021-04-13 LAB — CBC WITH DIFFERENTIAL/PLATELET
Abs Immature Granulocytes: 0.02 10*3/uL (ref 0.00–0.07)
Basophils Absolute: 0 10*3/uL (ref 0.0–0.1)
Basophils Relative: 1 %
Eosinophils Absolute: 0.1 10*3/uL (ref 0.0–0.5)
Eosinophils Relative: 2 %
HCT: 35.4 % — ABNORMAL LOW (ref 36.0–46.0)
Hemoglobin: 11.9 g/dL — ABNORMAL LOW (ref 12.0–15.0)
Immature Granulocytes: 1 %
Lymphocytes Relative: 40 %
Lymphs Abs: 1.8 10*3/uL (ref 0.7–4.0)
MCH: 31.8 pg (ref 26.0–34.0)
MCHC: 33.6 g/dL (ref 30.0–36.0)
MCV: 94.7 fL (ref 80.0–100.0)
Monocytes Absolute: 0.4 10*3/uL (ref 0.1–1.0)
Monocytes Relative: 9 %
Neutro Abs: 2.1 10*3/uL (ref 1.7–7.7)
Neutrophils Relative %: 47 %
Platelets: 210 10*3/uL (ref 150–400)
RBC: 3.74 MIL/uL — ABNORMAL LOW (ref 3.87–5.11)
RDW: 12.9 % (ref 11.5–15.5)
WBC: 4.3 10*3/uL (ref 4.0–10.5)
nRBC: 0 % (ref 0.0–0.2)

## 2021-04-13 LAB — COMPREHENSIVE METABOLIC PANEL
ALT: 28 U/L (ref 0–44)
AST: 23 U/L (ref 15–41)
Albumin: 4 g/dL (ref 3.5–5.0)
Alkaline Phosphatase: 87 U/L (ref 38–126)
Anion gap: 6 (ref 5–15)
BUN: 22 mg/dL — ABNORMAL HIGH (ref 6–20)
CO2: 27 mmol/L (ref 22–32)
Calcium: 9.6 mg/dL (ref 8.9–10.3)
Chloride: 103 mmol/L (ref 98–111)
Creatinine, Ser: 0.79 mg/dL (ref 0.44–1.00)
GFR, Estimated: >60 mL/min (ref >60)
Glucose, Bld: 94 mg/dL (ref 70–99)
Potassium: 4.3 mmol/L (ref 3.5–5.1)
Sodium: 136 mmol/L (ref 135–145)
Total Bilirubin: 1 mg/dL (ref 0.3–1.2)
Total Protein: 7 g/dL (ref 6.5–8.1)

## 2021-04-13 LAB — BETA HCG QUANT (REF LAB): hCG Quant: 278 m[IU]/mL

## 2021-04-13 MED ORDER — METHOTREXATE FOR ECTOPIC PREGNANCY
50.0000 mg/m2 | Freq: Once | INTRAMUSCULAR | Status: AC
  Administered 2021-04-13: 93 mg via INTRAMUSCULAR
  Filled 2021-04-13: qty 3.72

## 2021-04-13 NOTE — MAU Note (Signed)
Presents for MTX injection d/t ectopic pregnancy.

## 2021-04-13 NOTE — MAU Provider Note (Signed)
History     CSN: [id number]  Arrival date and time: 04/13/21 1020   Event Date/Time   First Provider Initiated Contact with Patient 04/13/21 1118      Chief Complaint  Patient presents with   MTX   Beverly Gonzalez is a 35 y.o. year old [id number] female at Unknown weeks gestation with a known ectopic pregnancy who was sent to MAU by [facility] for MTX w/u. Dr. Nelda Marseille called to report HCG level last week being 271 and 278 yesterday 04/12/21. She had surgery to remove LT ectopic pregnancy on 04/02/2021. She reports some pelvic cramping that was relieved yesterday 04/12/21.   OB History     Gravida  3   Para  2   Term  1   Preterm  1   AB      Living  2      SAB      IAB      Ectopic      Multiple  0   Live Births  2           Past Medical History:  Diagnosis Date   Anxiety    Severe preeclampsia, third trimester 11/18/2016   Vaginal Pap smear, abnormal     Past Surgical History:  Procedure Laterality Date   CESAREAN SECTION N/A 11/20/2016   Procedure: CESAREAN SECTION;  Surgeon: Janyth Pupa, DO;  Location: [facility];  Service: Obstetrics;  Laterality: N/A;   COLONOSCOPY     DIAGNOSTIC LAPAROSCOPY WITH REMOVAL OF ECTOPIC PREGNANCY Left 04/02/2021   Procedure: DIAGNOSTIC LAPAROSCOPY, EVACUATION OF HEMOPERITONEUM;  Surgeon: Florian Buff, MD;  Location: [facility];  Service: Gynecology;  Laterality: Left;   TONSILLECTOMY     WISDOM TOOTH EXTRACTION      Family History  Problem Relation Age of Onset   Hypertension Mother    Diabetes Father    Hypertension Father    Heart disease Maternal Grandmother    Heart disease Maternal Grandfather    Other Maternal Grandfather        covid   Stroke Maternal Grandfather    Diabetes Paternal Grandfather    Heart disease Paternal Grandfather    Stroke Paternal Grandfather    Heart attack Paternal Grandfather    Factor V Leiden deficiency Paternal Grandmother     Social History   Tobacco Use    Smoking status: Never   Smokeless tobacco: Never  Vaping Use   Vaping Use: Never used  Substance Use Topics   Alcohol use: No   Drug use: No    Allergies: No Known Allergies  No medications prior to admission.    Review of Systems  Constitutional: Negative.   HENT: Negative.    Eyes: Negative.   Respiratory: Negative.    Cardiovascular: Negative.   Gastrointestinal: Negative.   Endocrine: Negative.   Genitourinary:  Positive for pelvic pain (mild cramping, "I have only taken Tylenol").  Musculoskeletal: Negative.   Skin: Negative.   Allergic/Immunologic: Negative.   Neurological: Negative.   Hematological: Negative.   Psychiatric/Behavioral: Negative.    Physical Exam   Blood pressure 115/64, pulse 65, temperature 98.2 F (36.8 C), temperature source Oral, resp. rate 19, height 5\' 10"  (1.778 m), weight 68.9 kg, last menstrual period 10/05/2020, SpO2 100 %, currently breastfeeding.  Physical Exam Vitals and nursing note reviewed.  Constitutional:      Appearance: Normal appearance. She is normal weight.  Cardiovascular:     Rate and Rhythm: Normal rate.  Pulses: Normal pulses.  Pulmonary:     Effort: Pulmonary effort is normal.  Genitourinary:    Comments: deferred Skin:    General: Skin is warm and dry.  Neurological:     Mental Status: She is alert and oriented to person, place, and time.  Psychiatric:        Mood and Affect: Mood normal.        Behavior: Behavior normal.        Thought Content: Thought content normal.        Judgment: Judgment normal.    MAU Course  Procedures  MDM MTX w/u  * CBC  * CMP  * MTX injection >> cleared to be administered  Results for orders placed or performed during the hospital encounter of 04/13/21 (from the past 48 hour(s))  CBC WITH DIFFERENTIAL     Status: Abnormal   Collection Time: 04/13/21 10:30 AM  Result Value Ref Range   WBC 4.3 4.0 - 10.5 K/uL   RBC 3.74 (L) 3.87 - 5.11 MIL/uL   Hemoglobin 11.9 (L)  12.0 - 15.0 g/dL   HCT 35.4 (L) 36.0 - 46.0 %   MCV 94.7 80.0 - 100.0 fL   MCH 31.8 26.0 - 34.0 pg   MCHC 33.6 30.0 - 36.0 g/dL   RDW 12.9 11.5 - 15.5 %   Platelets 210 150 - 400 K/uL   nRBC 0.0 0.0 - 0.2 %   Neutrophils Relative % 47 %   Neutro Abs 2.1 1.7 - 7.7 K/uL   Lymphocytes Relative 40 %   Lymphs Abs 1.8 0.7 - 4.0 K/uL   Monocytes Relative 9 %   Monocytes Absolute 0.4 0.1 - 1.0 K/uL   Eosinophils Relative 2 %   Eosinophils Absolute 0.1 0.0 - 0.5 K/uL   Basophils Relative 1 %   Basophils Absolute 0.0 0.0 - 0.1 K/uL   Immature Granulocytes 1 %   Abs Immature Granulocytes 0.02 0.00 - 0.07 K/uL    Comment: Performed at [facility], [address], [place], [place] [address]  Comprehensive metabolic panel     Status: Abnormal   Collection Time: 04/13/21 10:30 AM  Result Value Ref Range   Sodium 136 135 - 145 mmol/L   Potassium 4.3 3.5 - 5.1 mmol/L   Chloride 103 98 - 111 mmol/L   CO2 27 22 - 32 mmol/L   Glucose, Bld 94 70 - 99 mg/dL    Comment: Glucose reference range applies only to samples taken after fasting for at least 8 hours.   BUN 22 (H) 6 - 20 mg/dL   Creatinine, Ser 0.79 0.44 - 1.00 mg/dL   Calcium 9.6 8.9 - 10.3 mg/dL   Total Protein 7.0 6.5 - 8.1 g/dL   Albumin 4.0 3.5 - 5.0 g/dL   AST 23 15 - 41 U/L   ALT 28 0 - 44 U/L   Alkaline Phosphatase 87 38 - 126 U/L   Total Bilirubin 1.0 0.3 - 1.2 mg/dL   GFR, Estimated >60 >60 mL/min    Comment: (NOTE) Calculated using the CKD-EPI Creatinine Equation (2021)    Anion gap 6 5 - 15    Comment: Performed at [facility] [address], [place], [place] [address]   Assessment and Plan  Tubal pregnancy without intrauterine pregnancy, unspecified laterality  - Information provided on MTX tx for ectopic pregnancy - Will follow HCG levels on Monday 04/16/21 and Thursday 04/19/21 at her office through [facility] - Dr. Nelda Marseille  aware of plan and will f/u with patient  - Discharge patient  - Patient verbalized  an understanding of the plan of care and agrees.    Laury Deep, CNM 04/13/2021, 11:18 AM

## 2021-04-18 ENCOUNTER — Other Ambulatory Visit: Payer: Self-pay

## 2021-04-18 DIAGNOSIS — O00109 Unspecified tubal pregnancy without intrauterine pregnancy: Secondary | ICD-10-CM

## 2021-04-19 ENCOUNTER — Other Ambulatory Visit: Payer: Self-pay | Admitting: *Deleted

## 2021-04-19 DIAGNOSIS — O00109 Unspecified tubal pregnancy without intrauterine pregnancy: Secondary | ICD-10-CM | POA: Diagnosis not present

## 2021-04-19 DIAGNOSIS — O00102 Left tubal pregnancy without intrauterine pregnancy: Secondary | ICD-10-CM

## 2021-04-20 LAB — BETA HCG QUANT (REF LAB): hCG Quant: 93 m[IU]/mL

## 2021-04-23 DIAGNOSIS — S93525A Sprain of metatarsophalangeal joint of left lesser toe(s), initial encounter: Secondary | ICD-10-CM | POA: Diagnosis not present

## 2021-04-23 DIAGNOSIS — M79672 Pain in left foot: Secondary | ICD-10-CM | POA: Diagnosis not present

## 2021-05-03 ENCOUNTER — Inpatient Hospital Stay (HOSPITAL_COMMUNITY)
Admission: AD | Admit: 2021-05-03 | Discharge: 2021-05-03 | Disposition: A | Discharge status: Home / Self Care | Payer: 59 | Attending: Obstetrics & Gynecology | Admitting: Obstetrics & Gynecology

## 2021-05-03 ENCOUNTER — Other Ambulatory Visit: Payer: Self-pay | Admitting: Obstetrics & Gynecology

## 2021-05-03 ENCOUNTER — Encounter (HOSPITAL_COMMUNITY): Payer: Self-pay | Admitting: Obstetrics & Gynecology

## 2021-05-03 ENCOUNTER — Inpatient Hospital Stay (HOSPITAL_COMMUNITY): Payer: 59

## 2021-05-03 ENCOUNTER — Other Ambulatory Visit (HOSPITAL_COMMUNITY): Payer: Self-pay

## 2021-05-03 ENCOUNTER — Other Ambulatory Visit: Payer: Self-pay

## 2021-05-03 DIAGNOSIS — O00109 Unspecified tubal pregnancy without intrauterine pregnancy: Secondary | ICD-10-CM

## 2021-05-03 DIAGNOSIS — N83202 Unspecified ovarian cyst, left side: Secondary | ICD-10-CM | POA: Diagnosis not present

## 2021-05-03 DIAGNOSIS — N83209 Unspecified ovarian cyst, unspecified side: Secondary | ICD-10-CM

## 2021-05-03 DIAGNOSIS — Z30432 Encounter for removal of intrauterine contraceptive device: Secondary | ICD-10-CM | POA: Diagnosis not present

## 2021-05-03 DIAGNOSIS — Z9889 Other specified postprocedural states: Secondary | ICD-10-CM | POA: Insufficient documentation

## 2021-05-03 DIAGNOSIS — O009 Unspecified ectopic pregnancy without intrauterine pregnancy: Secondary | ICD-10-CM

## 2021-05-03 DIAGNOSIS — O209 Hemorrhage in early pregnancy, unspecified: Secondary | ICD-10-CM | POA: Diagnosis not present

## 2021-05-03 DIAGNOSIS — R109 Unspecified abdominal pain: Secondary | ICD-10-CM

## 2021-05-03 LAB — CBC WITH DIFFERENTIAL/PLATELET
Abs Immature Granulocytes: 0.01 10*3/uL (ref 0.00–0.07)
Basophils Absolute: 0 10*3/uL (ref 0.0–0.1)
Basophils Relative: 1 %
Eosinophils Absolute: 0.1 10*3/uL (ref 0.0–0.5)
Eosinophils Relative: 2 %
HCT: 42.9 % (ref 36.0–46.0)
Hemoglobin: 14.6 g/dL (ref 12.0–15.0)
Immature Granulocytes: 0 %
Lymphocytes Relative: 50 %
Lymphs Abs: 2.4 10*3/uL (ref 0.7–4.0)
MCH: 31.7 pg (ref 26.0–34.0)
MCHC: 34 g/dL (ref 30.0–36.0)
MCV: 93.3 fL (ref 80.0–100.0)
Monocytes Absolute: 0.5 10*3/uL (ref 0.1–1.0)
Monocytes Relative: 9 %
Neutro Abs: 1.8 10*3/uL (ref 1.7–7.7)
Neutrophils Relative %: 38 %
Platelets: 267 10*3/uL (ref 150–400)
RBC: 4.6 MIL/uL (ref 3.87–5.11)
RDW: 12.3 % (ref 11.5–15.5)
WBC: 4.8 10*3/uL (ref 4.0–10.5)
nRBC: 0 % (ref 0.0–0.2)

## 2021-05-03 LAB — COMPREHENSIVE METABOLIC PANEL
ALT: 16 U/L (ref 0–44)
AST: 26 U/L (ref 15–41)
Albumin: 4.5 g/dL (ref 3.5–5.0)
Alkaline Phosphatase: 116 U/L (ref 38–126)
Anion gap: 13 (ref 5–15)
BUN: 18 mg/dL (ref 6–20)
CO2: 24 mmol/L (ref 22–32)
Calcium: 10.1 mg/dL (ref 8.9–10.3)
Chloride: 99 mmol/L (ref 98–111)
Creatinine, Ser: 0.87 mg/dL (ref 0.44–1.00)
GFR, Estimated: >60 mL/min (ref >60)
Glucose, Bld: 88 mg/dL (ref 70–99)
Potassium: 3.9 mmol/L (ref 3.5–5.1)
Sodium: 136 mmol/L (ref 135–145)
Total Bilirubin: 0.9 mg/dL (ref 0.3–1.2)
Total Protein: 8 g/dL (ref 6.5–8.1)

## 2021-05-03 LAB — WET PREP, GENITAL
Clue Cells Wet Prep HPF POC: NONE SEEN
Sperm: NONE SEEN
Trich, Wet Prep: NONE SEEN
WBC, Wet Prep HPF POC: >=10 — AB (ref <10)
Yeast Wet Prep HPF POC: NONE SEEN

## 2021-05-03 LAB — HCG, QUANTITATIVE, PREGNANCY: hCG, Beta Chain, Quant, S: 1 m[IU]/mL (ref <5)

## 2021-05-03 MED ORDER — HYDROMORPHONE HCL 1 MG/ML IJ SOLN
1.0000 mg | Freq: Once | INTRAMUSCULAR | Status: AC
  Administered 2021-05-03: 16:00:00 1 mg via INTRAVENOUS
  Filled 2021-05-03: qty 1

## 2021-05-03 MED ORDER — ONDANSETRON HCL 4 MG/2ML IJ SOLN
4.0000 mg | Freq: Once | INTRAMUSCULAR | Status: AC
  Administered 2021-05-03: 21:00:00 4 mg via INTRAVENOUS
  Filled 2021-05-03: qty 2

## 2021-05-03 MED ORDER — CARESTART COVID-19 HOME TEST VI KIT
PACK | 0 refills | Status: DC
  Filled 2021-05-03: qty 4, 4d supply, fill #0

## 2021-05-03 MED ORDER — KETOROLAC TROMETHAMINE 60 MG/2ML IM SOLN
60.0000 mg | Freq: Once | INTRAMUSCULAR | Status: AC
  Administered 2021-05-03: 16:00:00 60 mg via INTRAMUSCULAR
  Filled 2021-05-03: qty 2

## 2021-05-03 MED ORDER — ONDANSETRON HCL 4 MG/2ML IJ SOLN
4.0000 mg | Freq: Once | INTRAMUSCULAR | Status: AC
  Administered 2021-05-03: 16:00:00 4 mg via INTRAVENOUS
  Filled 2021-05-03: qty 2

## 2021-05-03 MED ORDER — HYDROMORPHONE HCL 1 MG/ML IJ SOLN
1.0000 mg | Freq: Once | INTRAMUSCULAR | Status: AC
  Administered 2021-05-03: 21:00:00 1 mg via INTRAVENOUS
  Filled 2021-05-03: qty 1

## 2021-05-03 MED ORDER — IBUPROFEN 600 MG PO TABS
600.0000 mg | ORAL_TABLET | Freq: Four times a day (QID) | ORAL | 1 refills | Status: DC | PRN
  Filled 2021-05-03: qty 30, 8d supply, fill #0

## 2021-05-03 MED ORDER — OXYCODONE-ACETAMINOPHEN 5-325 MG PO TABS
1.0000 | ORAL_TABLET | Freq: Four times a day (QID) | ORAL | 0 refills | Status: DC | PRN
  Filled 2021-05-03: qty 20, 3d supply, fill #0

## 2021-05-03 NOTE — MAU Note (Signed)
Beverly Gonzalez is a 35 y.o. at Unknown here in MAU reporting: has been treated for ectopic pregnancy with MTX. Today at 0330 woke up with sharp left lower abdominal pain. Not relieved with medication. No bleeding.   Onset of complaint: today  Pain score: 8/10  Vitals:   05/03/21 1450  BP: 123/85  Pulse: 74  Resp: 20  Temp: 97.7 F (36.5 C)  SpO2: 100%     Lab orders placed from triage: none

## 2021-05-03 NOTE — MAU Provider Note (Addendum)
History     CSN: [id number]  Arrival date and time: 05/03/21 1430   Event Date/Time   First Provider Initiated Contact with Patient 05/03/21 1524      Chief Complaint  Patient presents with   Abdominal Pain   [name] is a 35 y.o. female [phone] status post treatment for ectopic pregnancy. On 04/02/2021 she underwent Laparoscopic management of left ectopic pregnancy. Postoperative diagnosis showed left ectopic pregnancy, aborted out of the tube, no tubal rupture with 250cc of hemoperitoneum. Her quants continued to remain the same and she was then given MTX on 04/13/2021. Her last quant on 11/3 was 93. She presents with severe 8/10 left lower quadrant pain. The pain started this morning at 0330, it woke  her from her sleep. She took OTC pain medication which did not work. The pain is stabbing, the pain comes and goes.   She recently had sex however with a condom.   Abdominal Pain This is a recurrent problem. The problem occurs constantly. The pain is located in the LLQ. The quality of the pain is sharp and cramping. The abdominal pain does not radiate. Associated symptoms include nausea. Pertinent negatives include no constipation, fever or vomiting. The pain is aggravated by palpation and certain positions.    OB History     Gravida  3   Para  2   Term  1   Preterm  1   AB      Living  2      SAB      IAB      Ectopic      Multiple  0   Live Births  2           Past Medical History:  Diagnosis Date   Anxiety    Severe preeclampsia, third trimester 11/18/2016   Vaginal Pap smear, abnormal     Past Surgical History:  Procedure Laterality Date   CESAREAN SECTION N/A 11/20/2016   Procedure: CESAREAN SECTION;  Surgeon: Janyth Pupa, DO;  Location: [facility];  Service: Obstetrics;  Laterality: N/A;   COLONOSCOPY     DIAGNOSTIC LAPAROSCOPY WITH REMOVAL OF ECTOPIC PREGNANCY Left 04/02/2021   Procedure: DIAGNOSTIC LAPAROSCOPY, EVACUATION  OF HEMOPERITONEUM;  Surgeon: Florian Buff, MD;  Location: [facility];  Service: Gynecology;  Laterality: Left;   TONSILLECTOMY     WISDOM TOOTH EXTRACTION      Family History  Problem Relation Age of Onset   Hypertension Mother    Diabetes Father    Hypertension Father    Heart disease Maternal Grandmother    Heart disease Maternal Grandfather    Other Maternal Grandfather        covid   Stroke Maternal Grandfather    Diabetes Paternal Grandfather    Heart disease Paternal Grandfather    Stroke Paternal Grandfather    Heart attack Paternal Grandfather    Factor V Leiden deficiency Paternal Grandmother     Social History   Tobacco Use   Smoking status: Never   Smokeless tobacco: Never  Vaping Use   Vaping Use: Never used  Substance Use Topics   Alcohol use: No   Drug use: No    Allergies: No Known Allergies  Medications Prior to Admission  Medication Sig Dispense Refill Last Dose   sertraline (ZOLOFT) 50 MG tablet Take 1&1/2 tablets (75 mg total) by mouth daily. 135 tablet 1 05/03/2021   acetaminophen (TYLENOL) 500 MG tablet Take 500 mg by mouth every 6 (six)  hours as needed.   More than a month   cetirizine (ZYRTEC) 10 MG tablet Take 10 mg by mouth daily.   More than a month   COVID-19 At Home Antigen Test ([facility] COVID-19 HOME TEST) KIT Use as directed 4 each 0 More than a month   fluticasone (FLONASE) 50 MCG/ACT nasal spray Place into both nostrils daily.   More than a month   ketorolac (TORADOL) 10 MG tablet Take 1 tablet (10 mg total) by mouth every 8 (eight) hours as needed. 15 tablet 0 More than a month   Prenatal Vit-Fe Fumarate-FA (PRENATAL PO) Take 1 tablet by mouth daily.   More than a month   VITAMIN D PO Take by mouth.   More than a month   Results for orders placed or performed during the hospital encounter of 05/03/21 (from the past 48 hour(s))  CBC with Differential/Platelet     Status: None   Collection Time: 05/03/21  3:17 PM  Result Value Ref Range    WBC 4.8 4.0 - 10.5 K/uL   RBC 4.60 3.87 - 5.11 MIL/uL   Hemoglobin 14.6 12.0 - 15.0 g/dL   HCT 42.9 36.0 - 46.0 %   MCV 93.3 80.0 - 100.0 fL   MCH 31.7 26.0 - 34.0 pg   MCHC 34.0 30.0 - 36.0 g/dL   RDW 12.3 11.5 - 15.5 %   Platelets 267 150 - 400 K/uL   nRBC 0.0 0.0 - 0.2 %   Neutrophils Relative % 38 %   Neutro Abs 1.8 1.7 - 7.7 K/uL   Lymphocytes Relative 50 %   Lymphs Abs 2.4 0.7 - 4.0 K/uL   Monocytes Relative 9 %   Monocytes Absolute 0.5 0.1 - 1.0 K/uL   Eosinophils Relative 2 %   Eosinophils Absolute 0.1 0.0 - 0.5 K/uL   Basophils Relative 1 %   Basophils Absolute 0.0 0.0 - 0.1 K/uL   Immature Granulocytes 0 %   Abs Immature Granulocytes 0.01 0.00 - 0.07 K/uL    Comment: Performed at [facility], [address] [place], [place] [address]  hCG, quantitative, pregnancy     Status: None   Collection Time: 05/03/21  3:17 PM  Result Value Ref Range   hCG, Beta Chain, Quant, S 1 <5 mIU/mL    Comment:          GEST. AGE      CONC.  (mIU/mL)   <=1 WEEK        5 - 50     2 WEEKS       50 - 500     3 WEEKS       100 - 10,000     4 WEEKS     1,000 - 30,000     5 WEEKS     3,500 - 115,000   6-8 WEEKS     12,000 - 270,000    12 WEEKS     15,000 - 220,000        FEMALE AND NON-PREGNANT FEMALE:     LESS THAN 5 mIU/mL Performed at [facility], [place] [address], [place], [place] [address]   Comprehensive metabolic panel     Status: None   Collection Time: 05/03/21  3:17 PM  Result Value Ref Range   Sodium 136 135 - 145 mmol/L   Potassium 3.9 3.5 - 5.1 mmol/L   Chloride 99 98 - 111 mmol/L   CO2 24 22 - 32 mmol/L  Glucose, Bld 88 70 - 99 mg/dL    Comment: Glucose reference range applies only to samples taken after fasting for at least 8 hours.   BUN 18 6 - 20 mg/dL   Creatinine, Ser 0.87 0.44 - 1.00 mg/dL   Calcium 10.1 8.9 - 10.3 mg/dL   Total Protein 8.0 6.5 - 8.1 g/dL   Albumin 4.5 3.5 - 5.0 g/dL   AST 26 15 - 41 U/L   ALT 16 0 - 44 U/L   Alkaline  Phosphatase 116 38 - 126 U/L   Total Bilirubin 0.9 0.3 - 1.2 mg/dL   GFR, Estimated >60 >60 mL/min    Comment: (NOTE) Calculated using the CKD-EPI Creatinine Equation (2021)    Anion gap 13 5 - 15    Comment: Performed at [facility] [address], [place], [place] [address]  Wet prep, genital     Status: Abnormal   Collection Time: 05/03/21  4:58 PM  Result Value Ref Range   Yeast Wet Prep HPF POC NONE SEEN NONE SEEN   Trich, Wet Prep NONE SEEN NONE SEEN   Clue Cells Wet Prep HPF POC NONE SEEN NONE SEEN   WBC, Wet Prep HPF POC >=10 (A) <10    Comment: Please note change in reference range.   Sperm NONE SEEN     Comment: Performed at [facility], [place] [address] [place], [place] [address]    [place] Transvaginal  Addendum Date: 05/03/2021   ADDENDUM REPORT: 05/03/2021 17:28 ADDENDUM: There is color flow to the left ovary. If there is high clinical concern for torsion, full Doppler assessment of the left ovary is recommended. This was discussed with the ordering provider at the time of this addendum. Electronically Signed   By: Valetta Mole M.D.   On: 05/03/2021 17:28   Result Date: 05/03/2021 CLINICAL DATA:  Vaginal bleeding.  Beta HCG today: 1. EXAM: TRANSVAGINAL OB ULTRASOUND TECHNIQUE: Transvaginal ultrasound was performed for complete evaluation of the gestation as well as the maternal uterus, adnexal regions, and pelvic cul-de-sac. COMPARISON:  Pelvic ultrasound 04/02/2021 FINDINGS: Intrauterine gestational sac: None Yolk sac:  Not Visualized. Embryo:  Not Visualized. Cardiac Activity: Not Visualized. Maternal uterus/adnexae: An IUD is again seen in the uterus in appropriate position. The left ovary measures 7.6 cm x 5.5 cm x 6.2 cm. An anechoic structure is seen in the left ovary measuring 3.7 cm x 4.3 cm x 3.4 cm which was not seen on the prior study. There is small volume free fluid in the pelvis. IMPRESSION: 1. IUD again seen within the uterus. 2. Increased size of a  left ovarian cyst measuring 3.7 cm. Recommend followup US in 3-6 months. Note: This recommendation does not apply to premenarchal patients or to those with increased risk (genetic, family history, elevated tumor markers or other high-risk factors) of ovarian cancer. Reference: Radiology 2019 Nov; 293(2):359-371. 3. Trace free fluid in the pelvis. Electronically Signed: By: Valetta Mole M.D. On: 05/03/2021 16:48     Review of Systems  Constitutional:  Negative for fever.  Gastrointestinal:  Positive for abdominal pain and nausea. Negative for constipation and vomiting.  Genitourinary:  Negative for vaginal bleeding and vaginal discharge.  Physical Exam   Blood pressure 123/85, pulse 74, temperature 97.7 F (36.5 C), temperature source Oral, resp. rate 20, height _0  (1.778 m), weight 69.5 kg, last menstrual period 10/05/2020, SpO2 100 %, currently breastfeeding.  Physical Exam Constitutional:      Appearance:  She is well-developed.  HENT:     Head: Normocephalic.  Eyes:     Pupils: Pupils are equal, round, and reactive to light.  Abdominal:     Tenderness: There is no abdominal tenderness.  Genitourinary:    Comments: Vagina - Small amount of white vaginal discharge, no odor  Cervix - No contact bleeding, no active bleeding, IUD strings noted. IUD removed using ring forceps without difficulty.  Bimanual exam: Cervix closed Uterus non tender, normal size Left Adnexal tenderness  GC/Chlam, wet prep done Chaperone present for exam.   Neurological:     Mental Status: She is alert and oriented to person, place, and time.    MAU Course  Procedures  MDM  Patient requests IUD removal  Toradol 60 mg IM & Dilaudid 1 mg IV.  LR bolus given, CBC, CMP & Hcg ordered Quant now <1 US shows right ?Ovarian cyst, no venous dopplers done. Patient was sent back to [place] for doppler of left ovary. Pain is now 0/10 post medication.  Report given to Hansel Feinstein CNM who resumes care of the  patient. Patient awaiting [place] results.  Noni Saupe I, NP 05/03/2021 8:12 PM US OB Transvaginal  Addendum Date: 05/03/2021   ADDENDUM REPORT: 05/03/2021 17:28 ADDENDUM: There is color flow to the left ovary. If there is high clinical concern for torsion, full Doppler assessment of the left ovary is recommended. This was discussed with the ordering provider at the time of this addendum. Electronically Signed   By: Valetta Mole M.D.   On: 05/03/2021 17:28   Result Date: 05/03/2021 CLINICAL DATA:  Vaginal bleeding.  Beta HCG today: 1. EXAM: TRANSVAGINAL OB ULTRASOUND TECHNIQUE: Transvaginal ultrasound was performed for complete evaluation of the gestation as well as the maternal uterus, adnexal regions, and pelvic cul-de-sac. COMPARISON:  Pelvic ultrasound 04/02/2021 FINDINGS: Intrauterine gestational sac: None Yolk sac:  Not Visualized. Embryo:  Not Visualized. Cardiac Activity: Not Visualized. Maternal uterus/adnexae: An IUD is again seen in the uterus in appropriate position. The left ovary measures 7.6 cm x 5.5 cm x 6.2 cm. An anechoic structure is seen in the left ovary measuring 3.7 cm x 4.3 cm x 3.4 cm which was not seen on the prior study. There is small volume free fluid in the pelvis. IMPRESSION: 1. IUD again seen within the uterus. 2. Increased size of a left ovarian cyst measuring 3.7 cm. Recommend followup US in 3-6 months. Note: This recommendation does not apply to premenarchal patients or to those with increased risk (genetic, family history, elevated tumor markers or other high-risk factors) of ovarian cancer. Reference: Radiology 2019 Nov; 293(2):359-371. 3. Trace free fluid in the pelvis. Electronically Signed: By: Valetta Mole M.D. On: 05/03/2021 16:48   US PELVIC DOPPLER (TORSION R/O OR MASS ARTERIAL FLOW)  Result Date: 05/03/2021 CLINICAL DATA:  Left-sided pelvic pain, evaluate for possible torsion EXAM: DOPPLER ULTRASOUND OF OVARIES TECHNIQUE: Color and duplex Doppler  ultrasound was utilized to evaluate blood flow to the ovaries. COMPARISON:  Transvaginal ultrasound from earlier in the same day. FINDINGS: The enlarged left ovary is again identified with cystic lesion as previously described. Pulsed Doppler evaluation of both ovaries demonstrates normal low-resistance arterial and venous waveforms. Note is made of interval removal of the previously seen IUD. A small amount of fluid is noted within the endometrial canal likely related to the recent IUD removal. IMPRESSION: Normal ovarian waveforms bilaterally. No findings to suggest torsion are seen. Stable appearing cystic lesion within the left ovary. Follow-up  as previously described in earlier exam. Interval removal of an IUD. Electronically Signed   By: Inez Catalina M.D.   On: 05/03/2021 21:09    Treated with Toradol and Dilaudid for pain. [place] with Dopplers showed no evidence for Torsion Discussed warning signs to return for Message sent to Dr Elonda Husky and office for followup but pt prefers to followup with Dr Nelda Marseille  Assessment and Plan  A:   4 weeks PostOp from Ectopic surgery        New Left ovarian cyst causing severe pain, no torsion        IUD removed by Noni Saupe per pt request         P:   Discharged home        Rx ibuprofen and Percocet prn pain        Pt to followup with Dr Nelda Marseille this week for reevaluation        Encouraged to return if she develops worsening of symptoms, increase in pain, fever, or other concerning symptoms.   Hansel Feinstein CNM

## 2021-05-04 ENCOUNTER — Telehealth: Payer: Self-pay | Admitting: *Deleted

## 2021-05-04 ENCOUNTER — Other Ambulatory Visit (HOSPITAL_COMMUNITY): Payer: Self-pay

## 2021-05-04 LAB — GC/CHLAMYDIA PROBE AMP (~~LOC~~) NOT AT ARMC
Chlamydia: NEGATIVE
Comment: NEGATIVE
Comment: NORMAL
Neisseria Gonorrhea: NEGATIVE

## 2021-05-04 NOTE — Telephone Encounter (Signed)
Spoke to patient.  Made aware Dr Nelda Marseille not back in the office until the week of November 28.  Offered for patient to see Dr Elonda Husky next week or to wait until she is in the office.  Pt willing to wait and appt made for December 1 per patient request.

## 2021-05-17 ENCOUNTER — Ambulatory Visit: Payer: 59 | Admitting: Obstetrics & Gynecology

## 2021-05-24 ENCOUNTER — Other Ambulatory Visit (HOSPITAL_COMMUNITY): Payer: Self-pay

## 2021-05-25 DIAGNOSIS — D2262 Melanocytic nevi of left upper limb, including shoulder: Secondary | ICD-10-CM | POA: Diagnosis not present

## 2021-05-25 DIAGNOSIS — D485 Neoplasm of uncertain behavior of skin: Secondary | ICD-10-CM | POA: Diagnosis not present

## 2021-05-25 DIAGNOSIS — D225 Melanocytic nevi of trunk: Secondary | ICD-10-CM | POA: Diagnosis not present

## 2021-05-25 DIAGNOSIS — D2371 Other benign neoplasm of skin of right lower limb, including hip: Secondary | ICD-10-CM | POA: Diagnosis not present

## 2021-05-25 DIAGNOSIS — D2261 Melanocytic nevi of right upper limb, including shoulder: Secondary | ICD-10-CM | POA: Diagnosis not present

## 2021-06-04 ENCOUNTER — Telehealth: Payer: Self-pay | Admitting: Neurology

## 2021-06-04 NOTE — Telephone Encounter (Signed)
Appt made for 06-06-21 at 1630 for botox dysport.

## 2021-06-04 NOTE — Telephone Encounter (Signed)
Please place patient on my schedule at 430 Wednesday the 21st for Dysport injections into her bilateral masseters.  Beverly Gonzalez, she is aware of this appointment so you just have half to schedule her.  If you need pod for her to create an appointment at 430 please let them know.  I believe she needs 500 units of Dysport like last time.  I will not be starting my day until 930 that morning please do not schedule anyone prior to that and/or block.

## 2021-06-06 ENCOUNTER — Ambulatory Visit: Payer: 59 | Admitting: Neurology

## 2021-06-12 ENCOUNTER — Ambulatory Visit (INDEPENDENT_AMBULATORY_CARE_PROVIDER_SITE_OTHER): Payer: 59 | Admitting: Neurology

## 2021-06-12 ENCOUNTER — Other Ambulatory Visit: Payer: Self-pay

## 2021-06-12 DIAGNOSIS — G244 Idiopathic orofacial dystonia: Secondary | ICD-10-CM

## 2021-06-26 NOTE — Progress Notes (Addendum)
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  Symptoms started over a year ago with pain in the jaw muscles, limited mouth opening to where she had significantly decreased intake and weight loss, also dysarthria and temporomandibular joint problems due to the symptoms.  They were impairing her daily activities with social embarrassment, inability to work, weight loss and forming a significant impact on the overall quality of life of the patient.  Inability to open her jaw limits the maximal mouth opening and hampers speech, mastication/eating.  Medications tried: Patient's been under the care of neurology, primary care, she has tried physical therapy, massage, chiropractic, analgesics, muscle relaxer such as Flexeril and baclofen which gave her significant side effects such as sedation.  These were tried over a 51-month period without reduction of pain or quality of life or range of motion.  Dysport was used, 167 units in 5 locations were injected into the left masseter, 167 units in 5 locations were injected into the right masseter and 50 units was injected in 1 location into the left pterygoid and 50 units was injected in 1 location into the right pterygoid.  434 units of Dysport was used and 66 units was discarded.  Patient tolerated the procedure.  There was no significant bleeding or pain.

## 2021-06-28 ENCOUNTER — Ambulatory Visit (INDEPENDENT_AMBULATORY_CARE_PROVIDER_SITE_OTHER): Payer: 59 | Admitting: Internal Medicine

## 2021-06-28 ENCOUNTER — Other Ambulatory Visit (HOSPITAL_COMMUNITY): Payer: Self-pay

## 2021-06-28 ENCOUNTER — Encounter: Payer: Self-pay | Admitting: Internal Medicine

## 2021-06-28 VITALS — BP 120/70 | HR 54 | Temp 97.4°F | Ht 70.0 in | Wt 155.6 lb

## 2021-06-28 DIAGNOSIS — F411 Generalized anxiety disorder: Secondary | ICD-10-CM

## 2021-06-28 DIAGNOSIS — Z0000 Encounter for general adult medical examination without abnormal findings: Secondary | ICD-10-CM | POA: Diagnosis not present

## 2021-06-28 MED ORDER — SERTRALINE HCL 50 MG PO TABS
75.0000 mg | ORAL_TABLET | Freq: Every day | ORAL | 1 refills | Status: DC
  Filled 2021-06-28 – 2021-11-28 (×3): qty 135, 90d supply, fill #0

## 2021-06-28 NOTE — Patient Instructions (Signed)
-  Nice seeing you today!!  -Remember your COVID booster.  -Schedule follow up in 1 year or sooner as needed.  -Please forward labs to me once available.

## 2021-06-28 NOTE — Progress Notes (Signed)
Established Patient Office Visit     This visit occurred during the SARS-CoV-2 public health emergency.  Safety protocols were in place, including screening questions prior to the visit, additional usage of staff PPE, and extensive cleaning of exam room while observing appropriate contact time as indicated for disinfecting solutions.    CC/Reason for Visit: Annual preventive exam  HPI: Beverly Gonzalez is a 36 y.o. female who is coming in today for the above mentioned reasons. Past Medical History is significant for: Generalized anxiety disorder well-controlled on sertraline.  In October she had an ectopic pregnancy that required emergent surgery.  She is now recovering well from this.  She also has masseter and temporomandibular dysfunction and gets Botox injections with Dr. Jaynee Eagles.  She follows with GYN routinely.  She has had her flu vaccine but not yet her COVID booster, Tdap is up-to-date.  No acute concerns or complaints today.   Past Medical/Surgical History: Past Medical History:  Diagnosis Date   Anxiety    Severe preeclampsia, third trimester 11/18/2016   Vaginal Pap smear, abnormal     Past Surgical History:  Procedure Laterality Date   CESAREAN SECTION N/A 11/20/2016   Procedure: CESAREAN SECTION;  Surgeon: Janyth Pupa, DO;  Location: [facility];  Service: Obstetrics;  Laterality: N/A;   COLONOSCOPY     DIAGNOSTIC LAPAROSCOPY WITH REMOVAL OF ECTOPIC PREGNANCY Left 04/02/2021   Procedure: DIAGNOSTIC LAPAROSCOPY, EVACUATION OF HEMOPERITONEUM;  Surgeon: Florian Buff, MD;  Location: [facility];  Service: Gynecology;  Laterality: Left;   TONSILLECTOMY     WISDOM TOOTH EXTRACTION      Social History:  reports that she has never smoked. She has never used smokeless tobacco. She reports that she does not drink alcohol and does not use drugs.  Allergies: No Known Allergies  Family History:  Family History  Problem Relation Age of Onset   Hypertension Mother     Diabetes Father    Hypertension Father    Heart disease Maternal Grandmother    Heart disease Maternal Grandfather    Other Maternal Grandfather        covid   Stroke Maternal Grandfather    Diabetes Paternal Grandfather    Heart disease Paternal Grandfather    Stroke Paternal Grandfather    Heart attack Paternal Grandfather    Factor V Leiden deficiency Paternal Grandmother      Current Outpatient Medications:    cetirizine (ZYRTEC) 10 MG tablet, Take 10 mg by mouth daily., Disp: , Rfl:    COVID-19 At Home Antigen Test [facility] COVID-19 HOME TEST) KIT, Use as directed, Disp: 4 each, Rfl: 0   fluticasone (FLONASE) 50 MCG/ACT nasal spray, Place into both nostrils daily., Disp: , Rfl:    Prenatal Vit-Fe Fumarate-FA (PRENATAL PO), Take 1 tablet by mouth daily., Disp: , Rfl:    VITAMIN D PO, Take 5,000 Units by mouth daily., Disp: , Rfl:    sertraline (ZOLOFT) 50 MG tablet, Take 1&1/2 tablets (75 mg total) by mouth daily., Disp: 135 tablet, Rfl: 1  Review of Systems:  Constitutional: Denies fever, chills, diaphoresis, appetite change and fatigue.  HEENT: Denies photophobia, eye pain, redness, hearing loss, ear pain, congestion, sore throat, rhinorrhea, sneezing, mouth sores, trouble swallowing, neck pain, neck stiffness and tinnitus.   Respiratory: Denies SOB, DOE, cough, chest tightness,  and wheezing.   Cardiovascular: Denies chest pain, palpitations and leg swelling.  Gastrointestinal: Denies nausea, vomiting, abdominal pain, diarrhea, constipation, blood in stool and abdominal distention.  Genitourinary: Denies dysuria, urgency, frequency, hematuria, flank pain and difficulty urinating.  Endocrine: Denies: hot or cold intolerance, sweats, changes in hair or nails, polyuria, polydipsia. Musculoskeletal: Denies myalgias, back pain, joint swelling, arthralgias and gait problem.  Skin: Denies pallor, rash and wound.  Neurological: Denies dizziness, seizures, syncope, weakness,  light-headedness, numbness and headaches.  Hematological: Denies adenopathy. Easy bruising, personal or family bleeding history  Psychiatric/Behavioral: Denies suicidal ideation, mood changes, confusion, nervousness, sleep disturbance and agitation    Physical Exam: Vitals:   06/28/21 1458  BP: 120/70  Pulse: (!) 54  Temp: (!) 97.4 F (36.3 C)  TempSrc: Oral  SpO2: 99%  Weight: 155 lb 9 oz (70.6 kg)  Height: _0  (1.778 m)    Body mass index is 22.32 kg/m.   Constitutional: NAD, calm, comfortable Eyes: PERRL, lids and conjunctivae normal ENMT: Mucous membranes are moist. Posterior pharynx clear of any exudate or lesions. Normal dentition. Tympanic membrane is pearly white, no erythema or bulging. Neck: normal, supple, no masses, no thyromegaly Respiratory: clear to auscultation bilaterally, no wheezing, no crackles. Normal respiratory effort. No accessory muscle use.  Cardiovascular: Regular rate and rhythm, no murmurs / rubs / gallops. No extremity edema. 2+ pedal pulses. No carotid bruits.  Abdomen: no tenderness, no masses palpated. No hepatosplenomegaly. Bowel sounds positive.  Musculoskeletal: no clubbing / cyanosis. No joint deformity upper and lower extremities. Good ROM, no contractures. Normal muscle tone.  Neurologic: CN 2-12 grossly intact. Sensation intact, DTR normal. Strength 5/5 in all 4.  Psychiatric: Normal judgment and insight. Alert and oriented x 3. Normal mood.    Impression and Plan:  Encounter for preventive health examination -Recommend routine eye and dental care. -Immunizations: Due for COVID booster which she will get at pharmacy, other immunizations are up-to-date. -Healthy lifestyle discussed in detail. -Labs to be updated today. -Colon cancer screening: Commence at age 42 -Breast cancer screening: Commence age 32 -Cervical cancer screening: With GYN -Lung cancer screening: Not applicable -Prostate cancer screening: Not applicable -DEXA:  Not applicable  Generalized anxiety disorder  - Plan: sertraline (ZOLOFT) 50 MG tablet    Patient Instructions  -Nice seeing you today!!  -Remember your COVID booster.  -Schedule follow up in 1 year or sooner as needed.  -Please forward labs to me once available.    Lelon Frohlich, MD [facility]

## 2021-08-16 NOTE — Progress Notes (Signed)
? ? Beverly Gonzalez D.Merril Abbe ?[facility] ?[place] ?Phone: [phone] ?  ?Assessment and Plan:   ?  ?1. Acute right-sided thoracic back pain ?2. Rhomboid muscle pain ?3. Somatic dysfunction of thoracic region ?4. Somatic dysfunction of rib region ?5. Somatic dysfunction of upper extremities ?-Acute, uncomplicated, initial sports medicine visit ?- Suspect strain of left rhomboid leading to thoracic dysfunction and pain around the left shoulder blade ?- Patient elected for trigger point injections.  Tolerated well per note below ?- Start meloxicam 15 mg daily x2 weeks.  If still having pain after 2 weeks, complete 3rd-week of meloxicam. May use remaining meloxicam as needed once daily for pain control.  Do not to use additional NSAIDs while taking meloxicam.  May use Tylenol [phone] mg to 3 times a day for breakthrough pain. ?- Patient has received significant relief with OMT in the past.  Elects for repeat OMT today.  Tolerated well per note below. ?- Decision today to treat with OMT was based on Physical Exam ? ?After verbal consent patient was treated with HVLA (high velocity low amplitude), ME (muscle energy), FPR (flex positional release), ST (soft tissue), PC/PD (Pelvic Compression/ Pelvic Decompression) techniques in rib, thoracic, left upper extremity areas. Patient tolerated the procedure well with improvement in symptoms.  Patient educated on potential side effects of soreness and recommended to rest, hydrate, and use Tylenol as needed for pain control.  ? ?Trigger Point Injection: ?After informed consent was obtained, skin cleaned with alcohol  prep.  A total of 3 trigger points identified along left rhomboid.  Injections given over area of pain for total injection of 3 ml lidocaine 1% w/o epi.  Patient had relief after the injection without side effects.  Pt given signs of infection to watch for. ? ? ? ?Pertinent previous records reviewed include  none ?  ?Follow Up: 3 weeks for reevaluation.  Could repeat OMT +/- trigger point injections at that time ?  ?Subjective:   ?I, [name], am serving as a Education administrator for [employer] ? ?Chief Complaint: left shoulder blade pain  ? ?HPI:  ?08/17/2021 ?Patient is a 36 year old female complaining of left shoulder blade pain. Patient states that she does intense exercising felt a twinge while repeating a move 2 weeks ago and nothing has made it better, has tried heat and ice, has tried massage and massage gun, took a muscle relaxer, went to chiro felt good for a minute but still hurts been exercising through the pain, no numbness or tingling, radiates down to her mid back from her scapula , no clicking or popping  ? ?Relevant Historical Information: None pertinent ? ?Additional pertinent review of systems negative. ? ? ?Current Outpatient Medications:  ?  cetirizine (ZYRTEC) 10 MG tablet, Take 10 mg by mouth daily., Disp: , Rfl:  ?  COVID-19 At Home Antigen Test [facility] COVID-19 HOME TEST) KIT, Use as directed, Disp: 4 each, Rfl: 0 ?  fluticasone (FLONASE) 50 MCG/ACT nasal spray, Place into both nostrils daily., Disp: , Rfl:  ?  meloxicam (MOBIC) 15 MG tablet, Take 1 tablet (15 mg total) by mouth daily., Disp: 30 tablet, Rfl: 0 ?  Prenatal Vit-Fe Fumarate-FA (PRENATAL PO), Take 1 tablet by mouth daily., Disp: , Rfl:  ?  sertraline (ZOLOFT) 50 MG tablet, Take 1.5 tablets (75 mg total) by mouth daily., Disp: 135 tablet, Rfl: 1 ?  VITAMIN D PO, Take 5,000 Units by mouth daily., Disp: ,  Rfl:   ? ?Objective:   ?  ?Vitals:  ? 08/17/21 1346  ?BP: 110/80  ?Pulse: 72  ?SpO2: 99%  ?Weight: 154 lb (69.9 kg)  ?Height: '5\' 10"'  (1.778 m)  ?  ?  ?Body mass index is 22.1 kg/m?.  ?  ?Physical Exam:   ? ?General: Well-appearing, cooperative, sitting comfortably in no acute distress.  Bruising over left upper back from "scraping" with chiropractor ? ?OMT Physical Exam: ? ?ASIS Compression Test: Positive Right ?Rib: Ribs 4-6  inhalation dysfunction ?Thoracic: TTP paraspinal, T4-6 RLSR ?Left upper extremity: TTP left rhomboid, improved with scapular traction.  Bilateral scapula moved into unison without winging ? ? ? ? ?Electronically signed by:  ?Beverly Gonzalez D.Merril Abbe ?[facility] ?4:11 PM 08/17/21 ?

## 2021-08-17 ENCOUNTER — Other Ambulatory Visit (HOSPITAL_COMMUNITY): Payer: Self-pay

## 2021-08-17 ENCOUNTER — Ambulatory Visit (INDEPENDENT_AMBULATORY_CARE_PROVIDER_SITE_OTHER): Payer: 59 | Admitting: Sports Medicine

## 2021-08-17 ENCOUNTER — Other Ambulatory Visit: Payer: Self-pay

## 2021-08-17 VITALS — BP 110/80 | HR 72 | Ht 70.0 in | Wt 154.0 lb

## 2021-08-17 DIAGNOSIS — M546 Pain in thoracic spine: Secondary | ICD-10-CM

## 2021-08-17 DIAGNOSIS — M7918 Myalgia, other site: Secondary | ICD-10-CM | POA: Diagnosis not present

## 2021-08-17 DIAGNOSIS — M9908 Segmental and somatic dysfunction of rib cage: Secondary | ICD-10-CM

## 2021-08-17 DIAGNOSIS — M9902 Segmental and somatic dysfunction of thoracic region: Secondary | ICD-10-CM | POA: Diagnosis not present

## 2021-08-17 DIAGNOSIS — M9907 Segmental and somatic dysfunction of upper extremity: Secondary | ICD-10-CM

## 2021-08-17 DIAGNOSIS — M5415 Radiculopathy, thoracolumbar region: Secondary | ICD-10-CM | POA: Diagnosis not present

## 2021-08-17 DIAGNOSIS — M9901 Segmental and somatic dysfunction of cervical region: Secondary | ICD-10-CM | POA: Diagnosis not present

## 2021-08-17 MED ORDER — MELOXICAM 15 MG PO TABS
15.0000 mg | ORAL_TABLET | Freq: Every day | ORAL | 0 refills | Status: DC
  Filled 2021-08-17: qty 30, 30d supply, fill #0

## 2021-08-17 NOTE — Patient Instructions (Addendum)
Good to see you  ?Start meloxicam 15 mg daily x2 weeks.  If still having pain after 2 weeks, complete 3rd-week of meloxicam. May use remaining meloxicam as needed once daily for pain control.  Do not to use additional NSAIDs while taking meloxicam.  May use Tylenol [phone] mg to 3 times a day for breakthrough pain. ?3 week follow up  ? ?

## 2021-08-28 ENCOUNTER — Other Ambulatory Visit (HOSPITAL_COMMUNITY): Payer: Self-pay

## 2021-08-29 ENCOUNTER — Other Ambulatory Visit (HOSPITAL_COMMUNITY): Payer: Self-pay

## 2021-08-29 DIAGNOSIS — D225 Melanocytic nevi of trunk: Secondary | ICD-10-CM | POA: Diagnosis not present

## 2021-08-29 DIAGNOSIS — D485 Neoplasm of uncertain behavior of skin: Secondary | ICD-10-CM | POA: Diagnosis not present

## 2021-08-29 MED ORDER — MUPIROCIN 2 % EX OINT
TOPICAL_OINTMENT | CUTANEOUS | 0 refills | Status: DC
  Filled 2021-08-29: qty 22, 15d supply, fill #0

## 2021-08-31 DIAGNOSIS — M9908 Segmental and somatic dysfunction of rib cage: Secondary | ICD-10-CM | POA: Diagnosis not present

## 2021-08-31 DIAGNOSIS — M9902 Segmental and somatic dysfunction of thoracic region: Secondary | ICD-10-CM | POA: Diagnosis not present

## 2021-08-31 DIAGNOSIS — M9901 Segmental and somatic dysfunction of cervical region: Secondary | ICD-10-CM | POA: Diagnosis not present

## 2021-08-31 DIAGNOSIS — M5415 Radiculopathy, thoracolumbar region: Secondary | ICD-10-CM | POA: Diagnosis not present

## 2021-09-04 ENCOUNTER — Ambulatory Visit (INDEPENDENT_AMBULATORY_CARE_PROVIDER_SITE_OTHER): Payer: 59 | Admitting: Neurology

## 2021-09-04 DIAGNOSIS — G244 Idiopathic orofacial dystonia: Secondary | ICD-10-CM | POA: Diagnosis not present

## 2021-09-04 NOTE — Progress Notes (Signed)
DYSPORT - 500 units x 1 vial ?Lot: [id number] ?Expiration: 05/16/2022 ?[place]: [address]-3 ? ?Bacteriostatic 0.9% Sodium Chloride- 61m total ?Lot: [id number] ?Expiration: 01/16/2023 ?[place] [address]-72? ?Dx: G24.4 ?B/B ? ?

## 2021-09-05 NOTE — Progress Notes (Signed)
? ? Beverly Gonzalez ?[facility] ?[place] ?Phone: [phone] ?  ?Assessment and Plan:   ?  ?1. Acute right-sided thoracic back pain ?2. Rhomboid muscle pain ?3. Somatic dysfunction of cervical region ?4. Somatic dysfunction of thoracic region ?5. Somatic dysfunction of rib region ?- Acute, improving, subsequent visit ?- Significant improvement in strain of left rhomboid and decrease levels of somatic dysfunction after trigger point injections, OMT at previous office visit and course of meloxicam ?- Discontinue daily meloxicam use and can use remainder as needed ?- Patient has received significant relief with OMT in the past.  Elects for repeat OMT today.  Tolerated well per note below. ?- Decision today to treat with OMT was based on Physical Exam ? ?After verbal consent patient was treated with HVLA (high velocity low amplitude), ME (muscle energy), FPR (flex positional release), ST (soft tissue), PC/PD (Pelvic Compression/ Pelvic Decompression) techniques in cervical, rib, thoracic,   areas. Patient tolerated the procedure well with improvement in symptoms.  Patient educated on potential side effects of soreness and recommended to rest, hydrate, and use Tylenol as needed for pain control.  ?  ?Pertinent previous records reviewed include none ?  ?Follow Up: As needed if no improvement or worsening of symptoms.  Could repeat OMT ?  ?Subjective:   ?I, Beverly Gonzalez, am serving as a Education administrator for Doctor Beverly Gonzalez ? ?Chief Complaint: left shoulder pain  ? ?HPI:  ?08/17/2021 ?Patient is a 36 year old female complaining of left shoulder blade pain. Patient states that she does intense exercising felt a twinge while repeating a move 2 weeks ago and nothing has made it better, has tried heat and ice, has tried massage and massage gun, took a muscle relaxer, went to chiro felt good for a minute but still hurts been exercising through the pain, no numbness  or tingling, radiates down to her mid back from her scapula , no clicking or popping  ? ?09/06/2021 ?Patient states that shes doing good  ? ?Relevant Historical Information: History of ectopic pregnancy while on IUD ? ?Additional pertinent review of systems negative. ? ? ?Current Outpatient Medications:  ?  cetirizine (ZYRTEC) 10 MG tablet, Take 10 mg by mouth daily., Disp: , Rfl:  ?  COVID-19 At Home Antigen Test [facility] COVID-19 HOME TEST) KIT, Use as directed, Disp: 4 each, Rfl: 0 ?  fluticasone (FLONASE) 50 MCG/ACT nasal spray, Place into both nostrils daily., Disp: , Rfl:  ?  meloxicam (MOBIC) 15 MG tablet, Take 1 tablet (15 mg total) by mouth daily., Disp: 30 tablet, Rfl: 0 ?  mupirocin ointment (BACTROBAN) 2 %, Apply a small amount to affected area once a day, Disp: 22 g, Rfl: 0 ?  Prenatal Vit-Fe Fumarate-FA (PRENATAL PO), Take 1 tablet by mouth daily., Disp: , Rfl:  ?  sertraline (ZOLOFT) 50 MG tablet, Take 1.5 tablets (75 mg total) by mouth daily., Disp: 135 tablet, Rfl: 1 ?  VITAMIN D PO, Take 5,000 Units by mouth daily., Disp: , Rfl:   ? ?Objective:   ?  ?Vitals:  ? 09/06/21 1601  ?BP: 110/80  ?Pulse: 63  ?SpO2: 95%  ?Weight: 154 lb (69.9 kg)  ?Height: '5\' 10"'  (1.778 m)  ?  ?  ?Body mass index is 22.1 kg/m?.  ?  ?Physical Exam:   ? ?General: Well-appearing, cooperative, sitting comfortably in no acute distress.  ? ?OMT Physical Exam: ? ?ASIS Compression Test: Positive Right ?Cervical: TTP paraspinal, C7  RRS L ?Rib: Bilateral elevated first rib with NTTP ?Thoracic: TTP paraspinal, T4-7 RRSL ? ? ?Electronically signed by:  ?Beverly Gonzalez ?[facility] ?4:16 PM 09/06/21 ?

## 2021-09-06 ENCOUNTER — Other Ambulatory Visit: Payer: Self-pay

## 2021-09-06 ENCOUNTER — Ambulatory Visit (INDEPENDENT_AMBULATORY_CARE_PROVIDER_SITE_OTHER): Payer: 59 | Admitting: Sports Medicine

## 2021-09-06 ENCOUNTER — Encounter: Payer: Self-pay | Admitting: Sports Medicine

## 2021-09-06 VITALS — BP 110/80 | HR 63 | Ht 70.0 in | Wt 154.0 lb

## 2021-09-06 DIAGNOSIS — M7918 Myalgia, other site: Secondary | ICD-10-CM

## 2021-09-06 DIAGNOSIS — M9902 Segmental and somatic dysfunction of thoracic region: Secondary | ICD-10-CM

## 2021-09-06 DIAGNOSIS — M9908 Segmental and somatic dysfunction of rib cage: Secondary | ICD-10-CM | POA: Diagnosis not present

## 2021-09-06 DIAGNOSIS — M546 Pain in thoracic spine: Secondary | ICD-10-CM

## 2021-09-06 DIAGNOSIS — M9901 Segmental and somatic dysfunction of cervical region: Secondary | ICD-10-CM | POA: Diagnosis not present

## 2021-09-06 NOTE — Patient Instructions (Addendum)
Good to see you   

## 2021-09-09 NOTE — Progress Notes (Signed)
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  Symptoms started over a year ago with pain in the jaw muscles, limited mouth opening to where she had significantly decreased intake and weight loss, also dysarthria and temporomandibular joint problems due to the symptoms.  They were impairing her daily activities with social embarrassment, inability to work, weight loss and forming a significant impact on the overall quality of life of the patient.  Inability to open her jaw limits the maximal mouth opening and hampers speech, mastication/eating.  Medications tried: Patient's been under the care of neurology, primary care, she has tried physical therapy, massage, chiropractic, analgesics, muscle relaxer such as Flexeril and baclofen which gave her significant side effects such as sedation.  These were tried over a 6-month period without reduction of pain or quality of life or range of motion.  Response: She is much improved, feels functionality is better, decreased pain, increased range of motion of jaw, can eat without pain, can open her mouth/jaw wide.  Side effects none   reviewed w/ pt the procedure of botulinum toxin, incl side effects - localized weakness, inj site rxn, myalgia and spread from site of injection   Procedure note   EMG: EMG guidance was not used today to inject muscles detailed below but may be used in the future. Aseptic procedure was performed and patient tolerated procedure. Procedure was performed by Dr. A Sherene Plancarte   A 31 gauge needle was used to inject  Patient tolerated the procedure.    Refractory to oral medications  Adjusted dose today to optimize response   Motrin / tylenol for injections site pain / soreness   REMS precautions handout given to patient   RTC - see instructions for details   Dysport 500Units [address]-6 250U bilaterally was placed in 5 locations in each masseter  bilaterally.  Units Injected 500 , Units wasted 0 , Units billed 500   j code [id number]  Facial injections [id number]  Nayzeth Altman, MD  [facility]  [address]  [place], [place] [address]  Phone [phone] Fax [phone]     

## 2021-10-18 ENCOUNTER — Telehealth: Payer: Self-pay | Admitting: Neurology

## 2021-10-18 NOTE — Telephone Encounter (Signed)
Pt is scheduled for Dysport injection on 06/27, sent clinicals to [facility] @ [phone], pending auth number [id number]. ?

## 2021-11-28 ENCOUNTER — Other Ambulatory Visit (HOSPITAL_COMMUNITY): Payer: Self-pay

## 2021-11-29 NOTE — Telephone Encounter (Signed)
Received approval from [place], [place] # [id number] (03/20/2021-09/19/2022).

## 2021-11-30 ENCOUNTER — Other Ambulatory Visit (HOSPITAL_COMMUNITY): Payer: Self-pay

## 2021-12-11 ENCOUNTER — Ambulatory Visit: Payer: 59 | Admitting: Neurology

## 2021-12-11 ENCOUNTER — Telehealth: Payer: Self-pay | Admitting: Neurology

## 2021-12-21 ENCOUNTER — Ambulatory Visit (INDEPENDENT_AMBULATORY_CARE_PROVIDER_SITE_OTHER): Payer: 59 | Admitting: Neurology

## 2021-12-21 DIAGNOSIS — G244 Idiopathic orofacial dystonia: Secondary | ICD-10-CM

## 2021-12-21 NOTE — Progress Notes (Signed)
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  Symptoms started over a year ago with pain in the jaw muscles, limited mouth opening to where she had significantly decreased intake and weight loss, also dysarthria and temporomandibular joint problems due to the symptoms.  They were impairing her daily activities with social embarrassment, inability to work, weight loss and forming a significant impact on the overall quality of life of the patient.  Inability to open her jaw limits the maximal mouth opening and hampers speech, mastication/eating.  Medications tried: Patient's been under the care of neurology, primary care, she has tried physical therapy, massage, chiropractic, analgesics, muscle relaxer such as Flexeril and baclofen which gave her significant side effects such as sedation.  These were tried over a 6-month period without reduction of pain or quality of life or range of motion.  Response: She is much improved, feels functionality is better, decreased pain, increased range of motion of jaw, can eat without pain, can open her mouth/jaw wide.  Side effects none   reviewed w/ pt the procedure of botulinum toxin, incl side effects - localized weakness, inj site rxn, myalgia and spread from site of injection   Procedure note   EMG: EMG guidance was not used today to inject muscles detailed below but may be used in the future. Aseptic procedure was performed and patient tolerated procedure. Procedure was performed by Dr. A Akshaya Toepfer   A 31 gauge needle was used to inject  Patient tolerated the procedure.    Refractory to oral medications  Adjusted dose today to optimize response   Motrin / tylenol for injections site pain / soreness   REMS precautions handout given to patient   RTC - see instructions for details   Dysport 500Units [address]-6 250U bilaterally was placed in 5 locations in each masseter  bilaterally.  Units Injected 500 , Units wasted 0 , Units billed 500   j code [id number]  Facial injections 64612  Khyson Sebesta, MD  [facility]  [address]  , [place] [address]  Phone [phone] Fax [phone]     

## 2021-12-21 NOTE — Progress Notes (Unsigned)
DYSPORT - 500 units x 1 vial Lot: [id number] Expiration: 08/15/2023 [place]: [address]-6   Bacteriostatic 0.9% Sodium Chloride- 16m total Lot: [id number] Expiration: 01/16/2023 N[place]: [address]-96  Dx: GL16.4B/B Consent signed.

## 2022-01-23 ENCOUNTER — Encounter (INDEPENDENT_AMBULATORY_CARE_PROVIDER_SITE_OTHER): Payer: Self-pay

## 2022-02-21 ENCOUNTER — Other Ambulatory Visit (HOSPITAL_COMMUNITY): Payer: Self-pay

## 2022-02-21 ENCOUNTER — Other Ambulatory Visit: Payer: Self-pay | Admitting: Internal Medicine

## 2022-02-21 DIAGNOSIS — F411 Generalized anxiety disorder: Secondary | ICD-10-CM

## 2022-02-21 MED ORDER — SERTRALINE HCL 50 MG PO TABS
75.0000 mg | ORAL_TABLET | Freq: Every day | ORAL | 1 refills | Status: DC
  Filled 2022-02-21: qty 135, 90d supply, fill #0
  Filled 2022-06-07: qty 135, 90d supply, fill #1

## 2022-02-27 ENCOUNTER — Other Ambulatory Visit (HOSPITAL_COMMUNITY): Payer: Self-pay

## 2022-03-26 ENCOUNTER — Ambulatory Visit: Payer: 59 | Admitting: Neurology

## 2022-04-01 ENCOUNTER — Ambulatory Visit (INDEPENDENT_AMBULATORY_CARE_PROVIDER_SITE_OTHER): Payer: 59 | Admitting: Neurology

## 2022-04-01 ENCOUNTER — Encounter: Payer: Self-pay | Admitting: Neurology

## 2022-04-01 DIAGNOSIS — G244 Idiopathic orofacial dystonia: Secondary | ICD-10-CM | POA: Diagnosis not present

## 2022-04-01 MED ORDER — ABOBOTULINUMTOXINA 500 UNITS IM SOLR: 500.0000 [IU] | Freq: Once | INTRAMUSCULAR | Status: AC

## 2022-04-01 NOTE — Patient Instructions (Signed)
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  Symptoms started over a year ago with pain in the jaw muscles, limited mouth opening to where she had significantly decreased intake and weight loss, also dysarthria and temporomandibular joint problems due to the symptoms.  They were impairing her daily activities with social embarrassment, inability to work, weight loss and forming a significant impact on the overall quality of life of the patient.  Inability to open her jaw limits the maximal mouth opening and hampers speech, mastication/eating.  Medications tried: Patient's been under the care of neurology, primary care, she has tried physical therapy, massage, chiropractic, analgesics, muscle relaxer such as Flexeril and baclofen which gave her significant side effects such as sedation.  These were tried over a 63-monthperiod without reduction of pain or quality of life or range of motion.  Response: She is much improved, feels functionality is better, decreased pain, increased range of motion of jaw, can eat without pain, can open her mouth/jaw wide.  Side effects none   reviewed w/ pt the procedure of botulinum toxin, incl side effects - localized weakness, inj site rxn, myalgia and spread from site of injection   Procedure note   EMG: EMG guidance was not used today to inject muscles detailed below but may be used in the future. Aseptic procedure was performed and patient tolerated procedure. Procedure was performed by Dr. AMyrla Halsted  A 31 gauge needle was used to inject  Patient tolerated the procedure.    Refractory to oral medications  Adjusted dose today to optimize response   Motrin / tylenol for injections site pain / soreness   REMS precautions handout given to patient   RTC - see instructions for details   Dysport 500Units [address]-4250U bilaterally was placed in 5 locations in each masseter  bilaterally.  Units Injected 500 , Units wasted 0 , Units billed 500   j code [id number] Facial injections 6[place]et MD  [facility]  [address] [address] Phone [phone] [phone]

## 2022-04-01 NOTE — Progress Notes (Unsigned)
Dysport- 500units x 1 vial Lot: [id number] Expiration: 10/15/2023 NDC: [address]-0  Bacteriostatic 0.9% Sodium Chloride- 30m total Lot: [id number]: 02/16/2023 NDC: [address]-63 Dx: GE68.5B/B

## 2022-04-02 NOTE — Progress Notes (Signed)
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  Symptoms started over a year ago with pain in the jaw muscles, limited mouth opening to where she had significantly decreased intake and weight loss, also dysarthria and temporomandibular joint problems due to the symptoms.  They were impairing her daily activities with social embarrassment, inability to work, weight loss and forming a significant impact on the overall quality of life of the patient.  Inability to open her jaw limits the maximal mouth opening and hampers speech, mastication/eating.  Medications tried: Patient's been under the care of neurology, primary care, she has tried physical therapy, massage, chiropractic, analgesics, muscle relaxer such as Flexeril and baclofen which gave her significant side effects such as sedation.  These were tried over a 71-monthperiod without reduction of pain or quality of life or range of motion.  Response: She is much improved, feels functionality is better, decreased pain, increased range of motion of jaw, can eat without pain, can open her mouth/jaw wide.  Side effects none   reviewed w/ pt the procedure of botulinum toxin, incl side effects - localized weakness, inj site rxn, myalgia and spread from site of injection   Procedure note   EMG: EMG guidance was not used today to inject muscles detailed below but may be used in the future. Aseptic procedure was performed and patient tolerated procedure. Procedure was performed by Dr. AMyrla Halsted  A 31 gauge needle was used to inject  Patient tolerated the procedure.    Refractory to oral medications  Adjusted dose today to optimize response   Motrin / tylenol for injections site pain / soreness   REMS precautions handout given to patient   RTC - see instructions for details   Dysport 500Units [address]-5250U bilaterally was placed in 5 locations in each masseter  bilaterally.  Units Injected 500 , Units wasted 0 , Units billed 500   j code [id number] Facial injections 6[place] MD  [facility]  [address] [address] Phone [phone] [phone]

## 2022-04-19 ENCOUNTER — Encounter: Payer: Self-pay | Admitting: Obstetrics & Gynecology

## 2022-04-19 ENCOUNTER — Ambulatory Visit (INDEPENDENT_AMBULATORY_CARE_PROVIDER_SITE_OTHER): Payer: 59 | Admitting: Obstetrics & Gynecology

## 2022-04-19 ENCOUNTER — Other Ambulatory Visit (HOSPITAL_COMMUNITY)
Admission: RE | Admit: 2022-04-19 | Discharge: 2022-04-19 | Disposition: A | Discharge status: Home / Self Care | Payer: 59 | Source: Ambulatory Visit | Attending: Obstetrics & Gynecology | Admitting: Obstetrics & Gynecology

## 2022-04-19 VITALS — BP 116/73 | HR 63 | Ht 70.0 in | Wt 154.6 lb

## 2022-04-19 DIAGNOSIS — Z1329 Encounter for screening for other suspected endocrine disorder: Secondary | ICD-10-CM

## 2022-04-19 DIAGNOSIS — Z131 Encounter for screening for diabetes mellitus: Secondary | ICD-10-CM | POA: Diagnosis not present

## 2022-04-19 DIAGNOSIS — Z13220 Encounter for screening for lipoid disorders: Secondary | ICD-10-CM

## 2022-04-19 DIAGNOSIS — Z113 Encounter for screening for infections with a predominantly sexual mode of transmission: Secondary | ICD-10-CM

## 2022-04-19 DIAGNOSIS — Z01419 Encounter for gynecological examination (general) (routine) without abnormal findings: Secondary | ICD-10-CM | POA: Diagnosis not present

## 2022-04-19 NOTE — Progress Notes (Signed)
WELL-WOMAN EXAMINATION Patient name: Beverly Gonzalez MRN [id number]  Date of birth: [birth year] Chief Complaint:   Gynecologic Exam  History of Present Illness:   Beverly Gonzalez is a 36 y.o. [phone]  female being seen today for a routine well-woman exam.   Mostly regular menses- denies intermenstural bleeding.  Denies HMB or dysmenorrhea.  Denies irregular discharge, itching or irritation.  Denies pelvic or abdominal pain.  No acute complaints.  Patient's last menstrual period was 03/28/2022 (exact date).  The current method of family planning is coitus interruptus. Considering another pregnancy in the near future.   Last pap 2021.  Last mammogram: n/a. Last colonoscopy: n/a     04/19/2022    8:38 AM 06/28/2021    3:03 PM 10/06/2020    1:01 PM 09/22/2020    4:12 PM 10/01/2019   11:03 AM  Depression screen PHQ 2/9  Decreased Interest 0 0 0 0 0  Down, Depressed, Hopeless 0 0 0 0 0  PHQ - 2 Score 0 0 0 0 0  Altered sleeping 0  0 2 0  Tired, decreased energy _0 0  Change in appetite 0  0 0 0  Feeling bad or failure about yourself  0  0 1 0  Trouble concentrating 0  0 0 0  Moving slowly or fidgety/restless 0  0 0 0  Suicidal thoughts 0  0 0 0  PHQ-9 Score _1 0  Difficult doing work/chores    Not difficult at all Not difficult at all      Review of Systems:   Pertinent items are noted in HPI Denies any headaches, blurred vision, fatigue, shortness of breath, chest pain, abdominal pain, bowel movements, urination, or intercourse unless otherwise stated above.  Pertinent History Reviewed:  Reviewed past medical,surgical, social and family history.  Reviewed problem list, medications and allergies. Physical Assessment:   Vitals:   04/19/22 0839  BP: 116/73  Pulse: 63  Weight: 154 lb 9.6 oz (70.1 kg)  Height: 5' 10" (1.778 m)  Body mass index is 22.18 kg/m.        Physical Examination:   General appearance - well appearing, and in no distress  Mental  status - alert, oriented to person, place, and time  Psych:  She has a normal mood and affect  Skin - warm and dry, normal color, no suspicious lesions noted  Chest - effort normal, all lung fields clear to auscultation bilaterally  Heart - normal rate and regular rhythm  Neck:  midline trachea, no thyromegaly or nodules  Breasts - breasts appear normal, no suspicious masses, no skin or nipple changes or  axillary nodes  Abdomen - soft, nontender, nondistended, no masses or organomegaly  Pelvic - VULVA: normal appearing vulva with no masses, tenderness or lesions  VAGINA: normal appearing vagina with normal color and discharge, no lesions  CERVIX: normal appearing cervix without discharge or lesions, no CMT  UTERUS: uterus is felt to be normal size, shape, consistency and nontender   ADNEXA: No adnexal masses or tenderness noted.  Extremities:  No swelling or varicosities noted  Chaperone:  pt declined      Assessment & Plan:  1) Well-Woman Exam Pap up to date, reviewed screening guidelines STI screening to be complted  2) Preventive screening -pt fasting this am- lab work to be collected  Orders Placed This Encounter  Procedures   RPR   HIV Antibody (routine testing w rflx)   Lipid panel  TSH   Comp Met (CMET)    Meds: No orders of the defined types were placed in this encounter.   Follow-up: Return in about 1 year (around 04/20/2023) for Annual.   Janyth Pupa, DO Attending [place], [place] for [facility], [place]

## 2022-04-19 NOTE — Addendum Note (Signed)
Addended by: Janece Canterbury on: 04/19/2022 10:20 AM   Modules accepted: Orders

## 2022-04-20 LAB — RPR: RPR Ser Ql: NONREACTIVE

## 2022-04-20 LAB — COMPREHENSIVE METABOLIC PANEL
ALT: 20 IU/L (ref 0–32)
AST: 31 IU/L (ref 0–40)
Albumin/Globulin Ratio: 2 (ref 1.2–2.2)
Albumin: 4.7 g/dL (ref 3.9–4.9)
Alkaline Phosphatase: 111 IU/L (ref 44–121)
BUN/Creatinine Ratio: 18 (ref 9–23)
BUN: 16 mg/dL (ref 6–20)
Bilirubin Total: 0.9 mg/dL (ref 0.0–1.2)
CO2: 25 mmol/L (ref 20–29)
Calcium: 9.8 mg/dL (ref 8.7–10.2)
Chloride: 102 mmol/L (ref 96–106)
Creatinine, Ser: 0.91 mg/dL (ref 0.57–1.00)
Globulin, Total: 2.3 g/dL (ref 1.5–4.5)
Glucose: 58 mg/dL — ABNORMAL LOW (ref 70–99)
Potassium: 4.1 mmol/L (ref 3.5–5.2)
Sodium: 139 mmol/L (ref 134–144)
Total Protein: 7 g/dL (ref 6.0–8.5)
eGFR: 84 mL/min/{1.73_m2} (ref >59)

## 2022-04-20 LAB — LIPID PANEL
Chol/HDL Ratio: 2.4 ratio (ref 0.0–4.4)
Cholesterol, Total: 193 mg/dL (ref 100–199)
HDL: 79 mg/dL (ref >39)
LDL Chol Calc (NIH): 103 mg/dL — ABNORMAL HIGH (ref 0–99)
Triglycerides: 58 mg/dL (ref 0–149)
VLDL Cholesterol Cal: 11 mg/dL (ref 5–40)

## 2022-04-20 LAB — HIV ANTIBODY (ROUTINE TESTING W REFLEX): HIV Screen 4th Generation wRfx: NONREACTIVE

## 2022-04-22 LAB — CERVICOVAGINAL ANCILLARY ONLY
Chlamydia: NEGATIVE
Comment: NEGATIVE
Comment: NORMAL
Neisseria Gonorrhea: NEGATIVE

## 2022-04-27 ENCOUNTER — Other Ambulatory Visit: Payer: Self-pay | Admitting: Neurology

## 2022-04-27 MED ORDER — AMOXICILLIN-POT CLAVULANATE 875-125 MG PO TABS: 1.0000 | ORAL_TABLET | Freq: Two times a day (BID) | ORAL | 0 refills | Status: DC

## 2022-05-13 ENCOUNTER — Telehealth: Payer: Self-pay | Admitting: Obstetrics & Gynecology

## 2022-05-13 DIAGNOSIS — N760 Acute vaginitis: Secondary | ICD-10-CM

## 2022-05-13 MED ORDER — FLUCONAZOLE 150 MG PO TABS
ORAL_TABLET | ORAL | 2 refills | Status: DC
  Filled 2022-05-13: qty 2, 3d supply, fill #0

## 2022-05-13 NOTE — Telephone Encounter (Signed)
Pt called in reporting itching and burning. S/p OTC yeast treatment with only minimal improvement.  Rx for Diflucan sent in.  F/U prn

## 2022-05-14 ENCOUNTER — Other Ambulatory Visit (HOSPITAL_COMMUNITY): Payer: Self-pay

## 2022-05-29 DIAGNOSIS — D2261 Melanocytic nevi of right upper limb, including shoulder: Secondary | ICD-10-CM | POA: Diagnosis not present

## 2022-05-29 DIAGNOSIS — D2271 Melanocytic nevi of right lower limb, including hip: Secondary | ICD-10-CM | POA: Diagnosis not present

## 2022-05-29 DIAGNOSIS — D225 Melanocytic nevi of trunk: Secondary | ICD-10-CM | POA: Diagnosis not present

## 2022-05-29 DIAGNOSIS — D224 Melanocytic nevi of scalp and neck: Secondary | ICD-10-CM | POA: Diagnosis not present

## 2022-05-29 DIAGNOSIS — D2371 Other benign neoplasm of skin of right lower limb, including hip: Secondary | ICD-10-CM | POA: Diagnosis not present

## 2022-05-29 DIAGNOSIS — D692 Other nonthrombocytopenic purpura: Secondary | ICD-10-CM | POA: Diagnosis not present

## 2022-05-29 DIAGNOSIS — D2272 Melanocytic nevi of left lower limb, including hip: Secondary | ICD-10-CM | POA: Diagnosis not present

## 2022-05-29 DIAGNOSIS — D2262 Melanocytic nevi of left upper limb, including shoulder: Secondary | ICD-10-CM | POA: Diagnosis not present

## 2022-06-06 NOTE — Progress Notes (Signed)
Beverly Gonzalez D.[facility] [place] Phone: [phone]   Assessment and Plan:     1. Left hip pain 2. Muscle strain of right gluteal region, initial encounter -Acute, complicated, initial sports medicine visit - Unclear etiology of deep posterior left hip/gluteal pain.  Suspect deep muscular strain such as glued minimus versus piriformis versus obturator - Patient had area of maximal pain over lower SI joint and deep medial gluteal musculature with palpation as well as deep pain with sitting and maximal hip flexion.  Unremarkable ultrasound that did not show any clear tearing or swelling and unremarkable x-ray imaging without acute fracture - Patient had been taking ibuprofen 200 mg 4 tablets 3 times a day without significant benefit which is an adequate prescription strength equivalent and has not had any relief - Will transition to prednisone Dosepak.  Prescription provided today - Recommend relative rest of gluteal and hip exercises - X-ray obtained in clinic.  My interpretation: No acute fracture or dislocation.  No cortical irregularity along ischium  Sports Medicine: Musculoskeletal Ultrasound. Exam: Limited US of left posterior hip Diagnosis: Left posterior hip pain  US Findings: Unremarkable ultrasound appearance of gluteus maximus, gluteus medius, piriformis, SI joint, proximal hamstring muscle and tendon, ischium  US Impression:  No acute findings  Other orders - methylPREDNISolone (MEDROL DOSEPAK) 4 MG TBPK tablet; Take 6 tablets on day 1.  Take 5 tablets on day 2.  Take 4 tablets on day 3.  Take 3 tablets on day 4.  Take 2 tablets on day 5.  Take 1 tablet on day 6.    Pertinent previous records reviewed include none   Follow Up: 2 to 3 weeks for reevaluation.  Could consider advanced imaging versus repeat ultrasound.  As no specific location could be identified with ultrasound, I was not comfortable  performing CSI at today's visit.  If further pathology to be seen at follow-up visit, could consider CSI   Subjective:   I, [name], am serving as a Education administrator for Doctor Glennon Mac   Chief Complaint: hip pain   HPI:  06/07/2022 Patient is a 36 year old female complaining of hip pain. Patient states left sided butt pain , was doing splits and she thinks got up wrong or to fast, has pain sitting on left side , no numbness tingling, constant throbbing , pain when stretching, pain with anything hamstring , no radiating pain, ib 800 mg for the pain and that doesn't help ,    Relevant Historical Information: History of ectopic pregnancy while on IUD Additional pertinent review of systems negative.   Current Outpatient Medications:    amoxicillin-clavulanate (AUGMENTIN) 875-125 MG tablet, Take 1 tablet by mouth 2 (two) times daily., Disp: 20 tablet, Rfl: 0   ASPIRIN 81 PO, Take by mouth., Disp: , Rfl:    cetirizine (ZYRTEC) 10 MG tablet, Take 10 mg by mouth daily., Disp: , Rfl:    fluconazole (DIFLUCAN) 150 MG tablet, Take 1 tablet, repeat in 3 days if needed, Disp: 2 tablet, Rfl: 2   fluticasone (FLONASE) 50 MCG/ACT nasal spray, Place into both nostrils daily., Disp: , Rfl:    methylPREDNISolone (MEDROL DOSEPAK) 4 MG TBPK tablet, Take 6 tablets on day 1.  Take 5 tablets on day 2.  Take 4 tablets on day 3.  Take 3 tablets on day 4.  Take 2 tablets on day 5.  Take 1 tablet on day 6., Disp: 21 tablet, Rfl:  0   Prenatal Vit-Fe Fumarate-FA (PRENATAL PO), Take 1 tablet by mouth daily., Disp: , Rfl:    sertraline (ZOLOFT) 50 MG tablet, Take 1 and 1/2 tablets (75 mg total) by mouth daily., Disp: 135 tablet, Rfl: 1   VITAMIN D PO, Take 5,000 Units by mouth daily., Disp: , Rfl:   Current Facility-Administered Medications:    AbobotulinumtoxinA (DYSPORT) 500 units injection 500 Units, 500 Units, Intramuscular, Once, Sarina Ill B, MD   Objective:     Vitals:   06/07/22 0958  BP: 100/80   Pulse: 60  SpO2: 100%  Weight: 156 lb (70.8 kg)  Height: '5\' 10"'$  (1.778 m)      Body mass index is 22.38 kg/m.    Physical Exam:    General: awake, alert, and oriented no acute distress, nontoxic Skin: no suspicious lesions or rashes Neuro:sensation intact distally with no dificits, normal muscle tone, no atrophy, strength 5/5 in all tested lower ext groups Psych: normal mood and affect, speech clear  Left hip: No deformity, swelling or wasting ROM Flexion 90 with posterior hip pain with prolonged full flexion, ext 30, IR 45, ER 45 TTP lower and medial gluteal musculature, and inferior SI joint NTTP over the hip flexors, greater trochanter, superficial and lateral gluteal musculature, superior and middle si joint, lumbar spine Negative log roll with FROM Negative FABER Negative FADIR Negative Piriformis test Negative trendelenberg Gait normal  No pain with resisted knee flexion with knee at 0, 90, 170 degrees  Electronically signed by:  Beverly Gonzalez D.[facility] 11:31 AM 06/07/22

## 2022-06-07 ENCOUNTER — Other Ambulatory Visit: Payer: Self-pay

## 2022-06-07 ENCOUNTER — Ambulatory Visit (INDEPENDENT_AMBULATORY_CARE_PROVIDER_SITE_OTHER): Payer: 59

## 2022-06-07 ENCOUNTER — Encounter (HOSPITAL_COMMUNITY): Payer: Self-pay

## 2022-06-07 ENCOUNTER — Ambulatory Visit: Payer: Self-pay

## 2022-06-07 ENCOUNTER — Ambulatory Visit (INDEPENDENT_AMBULATORY_CARE_PROVIDER_SITE_OTHER): Payer: 59 | Admitting: Sports Medicine

## 2022-06-07 ENCOUNTER — Other Ambulatory Visit (HOSPITAL_COMMUNITY): Payer: Self-pay

## 2022-06-07 VITALS — BP 100/80 | HR 60 | Ht 70.0 in | Wt 156.0 lb

## 2022-06-07 DIAGNOSIS — M25552 Pain in left hip: Secondary | ICD-10-CM

## 2022-06-07 DIAGNOSIS — S76011A Strain of muscle, fascia and tendon of right hip, initial encounter: Secondary | ICD-10-CM

## 2022-06-07 MED ORDER — METHYLPREDNISOLONE 4 MG PO TBPK
ORAL_TABLET | ORAL | 0 refills | Status: DC
  Filled 2022-06-07 (×2): qty 21, 6d supply, fill #0

## 2022-06-07 NOTE — Patient Instructions (Addendum)
Good to see you  Recommend relative rest for the next 2 weeks Prednisone dos pack  May use muscle relaxer as needed nightly  2-3 week follow up

## 2022-06-11 ENCOUNTER — Other Ambulatory Visit: Payer: Self-pay

## 2022-06-13 DIAGNOSIS — R233 Spontaneous ecchymoses: Secondary | ICD-10-CM | POA: Diagnosis not present

## 2022-06-13 DIAGNOSIS — D485 Neoplasm of uncertain behavior of skin: Secondary | ICD-10-CM | POA: Diagnosis not present

## 2022-06-20 NOTE — Progress Notes (Deleted)
    Beverly Gonzalez D.[facility] Phone: [phone]   Assessment and Plan:     There are no diagnoses linked to this encounter.  ***   Pertinent previous records reviewed include ***   Follow Up: ***     Subjective:   I, Beverly Gonzalez, am serving as a Education administrator for Doctor Glennon Mac   Chief Complaint: hip pain   HPI:  06/07/2022 Patient is a 37 year old female complaining of hip pain. Patient states left sided butt pain , was doing splits and she thinks got up wrong or to fast, has pain sitting on left side , no numbness tingling, constant throbbing , pain when stretching, pain with anything hamstring , no radiating pain, ib 800 mg for the pain and that doesn't help ,    06/21/2022 Patient states    Relevant Historical Information: History of ectopic pregnancy while on IUD Additional pertinent review of systems negative.    Additional pertinent review of systems negative.   Current Outpatient Medications:    amoxicillin-clavulanate (AUGMENTIN) 875-125 MG tablet, Take 1 tablet by mouth 2 (two) times daily., Disp: 20 tablet, Rfl: 0   ASPIRIN 81 PO, Take by mouth., Disp: , Rfl:    cetirizine (ZYRTEC) 10 MG tablet, Take 10 mg by mouth daily., Disp: , Rfl:    fluconazole (DIFLUCAN) 150 MG tablet, Take 1 tablet, repeat in 3 days if needed, Disp: 2 tablet, Rfl: 2   fluticasone (FLONASE) 50 MCG/ACT nasal spray, Place into both nostrils daily., Disp: , Rfl:    methylPREDNISolone (MEDROL DOSEPAK) 4 MG TBPK tablet, Take 6 tablets on day 1.  Take 5 tablets on day 2.  Take 4 tablets on day 3.  Take 3 tablets on day 4.  Take 2 tablets on day 5.  Take 1 tablet on day 6., Disp: 21 tablet, Rfl: 0   Prenatal Vit-Fe Fumarate-FA (PRENATAL PO), Take 1 tablet by mouth daily., Disp: , Rfl:    sertraline (ZOLOFT) 50 MG tablet, Take 1 and 1/2 tablets (75 mg total) by mouth daily., Disp: 135 tablet, Rfl: 1   VITAMIN D PO,  Take 5,000 Units by mouth daily., Disp: , Rfl:   Current Facility-Administered Medications:    AbobotulinumtoxinA (DYSPORT) 500 units injection 500 Units, 500 Units, Intramuscular, Once, Melvenia Beam, MD   Objective:     There were no vitals filed for this visit.    There is no height or weight on file to calculate BMI.    Physical Exam:    ***   Electronically signed by:  Beverly Gonzalez D.[facility] 10:41 AM 06/20/22

## 2022-06-21 ENCOUNTER — Ambulatory Visit: Payer: Commercial Managed Care - PPO | Admitting: Sports Medicine

## 2022-07-05 DIAGNOSIS — M545 Low back pain, unspecified: Secondary | ICD-10-CM | POA: Diagnosis not present

## 2022-07-12 DIAGNOSIS — M545 Low back pain, unspecified: Secondary | ICD-10-CM | POA: Diagnosis not present

## 2022-07-19 DIAGNOSIS — M545 Low back pain, unspecified: Secondary | ICD-10-CM | POA: Diagnosis not present

## 2022-07-26 DIAGNOSIS — M545 Low back pain, unspecified: Secondary | ICD-10-CM | POA: Diagnosis not present

## 2022-08-05 DIAGNOSIS — M545 Low back pain, unspecified: Secondary | ICD-10-CM | POA: Diagnosis not present

## 2022-08-09 DIAGNOSIS — M545 Low back pain, unspecified: Secondary | ICD-10-CM | POA: Diagnosis not present

## 2022-08-23 DIAGNOSIS — M545 Low back pain, unspecified: Secondary | ICD-10-CM | POA: Diagnosis not present

## 2022-09-06 DIAGNOSIS — M545 Low back pain, unspecified: Secondary | ICD-10-CM | POA: Diagnosis not present

## 2022-09-09 ENCOUNTER — Other Ambulatory Visit: Payer: Self-pay | Admitting: *Deleted

## 2022-09-09 ENCOUNTER — Other Ambulatory Visit (HOSPITAL_COMMUNITY): Payer: Self-pay

## 2022-09-09 DIAGNOSIS — F411 Generalized anxiety disorder: Secondary | ICD-10-CM

## 2022-09-09 MED ORDER — SERTRALINE HCL 50 MG PO TABS
75.0000 mg | ORAL_TABLET | Freq: Every day | ORAL | 1 refills | Status: DC
  Filled 2022-09-09: qty 135, 90d supply, fill #0
  Filled 2022-09-13 – 2022-12-10 (×3): qty 136, 90d supply, fill #0
  Filled 2023-03-17: qty 136, 90d supply, fill #1

## 2022-09-13 ENCOUNTER — Other Ambulatory Visit (HOSPITAL_COMMUNITY): Payer: Self-pay

## 2022-09-13 ENCOUNTER — Telehealth: Payer: Self-pay | Admitting: Neurology

## 2022-09-13 DIAGNOSIS — M545 Low back pain, unspecified: Secondary | ICD-10-CM | POA: Diagnosis not present

## 2022-09-13 NOTE — Telephone Encounter (Signed)
Can you add her Friday April 12th at noon for botox in her masseters? Its ok with megan because I can let all the staff go and do it myself. Thank you !

## 2022-09-16 ENCOUNTER — Other Ambulatory Visit (HOSPITAL_COMMUNITY): Payer: Self-pay

## 2022-09-16 NOTE — Telephone Encounter (Signed)
Pharmacy Patient Advocate Encounter   Received notification from [facility] that prior authorization for Dysport 500UNIT solution is required/requested.   PA submitted on 09/16/2022 to (ins) [employer] via CoverMyMeds Key or (Medicaid) confirmation # [id number] Status is pending

## 2022-09-16 NOTE — Telephone Encounter (Signed)
   Submitted a PA via Availity for Medical Benefits. Will update once insurance makes a decision.

## 2022-09-16 NOTE — Telephone Encounter (Signed)
Can you please start auth asap for Dysport under pt's new [employer]? We just added her on for 09/27/22.

## 2022-09-19 ENCOUNTER — Other Ambulatory Visit (HOSPITAL_COMMUNITY): Payer: Self-pay

## 2022-09-19 NOTE — Telephone Encounter (Signed)
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

## 2022-09-19 NOTE — Telephone Encounter (Signed)
   Received a request for additional information-this is via medical benefits-They are not recognizing this ICD Code-Please advise.

## 2022-09-20 ENCOUNTER — Other Ambulatory Visit (HOSPITAL_COMMUNITY): Payer: Self-pay

## 2022-09-20 NOTE — Telephone Encounter (Signed)
Pharmacy Patient Advocate Encounter- Dysport-Pharmacy Benefit:  PA was submitted to [employer] and has been approved through: 09/17/2023 Authorization# PA Case ID #: [id number]  Please send prescription to Specialty Pharmacy: [facility]: [phone]  Estimated Copay is: $859.00  It appears in the past she always used pharmacy benefits-I also tried to get approval via medical benefits just to see if it might be cheaper for the PT however [employer] denied the PA-Denial letter is in the chart as well as [employer] approval letter for pharmacy benefit. PT may be able to use the Dysport copay assistance program.

## 2022-09-23 ENCOUNTER — Other Ambulatory Visit: Payer: Self-pay

## 2022-09-23 ENCOUNTER — Other Ambulatory Visit (HOSPITAL_COMMUNITY): Payer: Self-pay

## 2022-09-23 MED ORDER — ABOBOTULINUMTOXINA 500 UNITS IM SOLR
500.0000 [IU] | Freq: Once | INTRAMUSCULAR | 0 refills | Status: DC
  Filled 2022-09-23 – 2023-02-10 (×2): qty 1, 1d supply, fill #0

## 2022-09-23 NOTE — Addendum Note (Signed)
Addended by: Guy Begin on: 09/23/2022 08:54 AM   Modules accepted: Orders

## 2022-09-24 NOTE — Telephone Encounter (Signed)
[facility] is working on getting pt a copay card.

## 2022-09-25 ENCOUNTER — Other Ambulatory Visit (HOSPITAL_COMMUNITY): Payer: Self-pay

## 2022-09-26 ENCOUNTER — Other Ambulatory Visit (HOSPITAL_COMMUNITY): Payer: Self-pay

## 2022-09-26 ENCOUNTER — Other Ambulatory Visit: Payer: Self-pay

## 2022-09-26 NOTE — Telephone Encounter (Signed)
That is fine  thanks

## 2022-09-26 NOTE — Telephone Encounter (Signed)
[  3:41 PM] Beverly Gonzalez  Female, [place] y.o., [birth year]  [phone]  MRN: [id number]  [facility] said this pt called them back and is enrolling into the Dysport savings program. Her dysport won't be here in time for tomorrow's appt, I could use office stock and replace??   I relayed that we can use BB stock.

## 2022-09-26 NOTE — Telephone Encounter (Signed)
[facility] states they are unable to sign pt up for a Dysport assistance program and the pt will have to sign herself up. They have not been able to get in touch with her to go over copay/assistance program. I left her a message to give them a call as soon as possible.  If pt doesn't get in touch with [facility] today we may have to r/s her Dysport injection because we don't have the medication here to administer.

## 2022-09-27 ENCOUNTER — Ambulatory Visit (INDEPENDENT_AMBULATORY_CARE_PROVIDER_SITE_OTHER): Payer: 59 | Admitting: Neurology

## 2022-09-27 DIAGNOSIS — G244 Idiopathic orofacial dystonia: Secondary | ICD-10-CM

## 2022-09-27 MED ORDER — ABOBOTULINUMTOXINA 500 UNITS IM SOLR
500.0000 [IU] | Freq: Once | INTRAMUSCULAR | Status: AC
  Administered 2022-09-27: 500 [IU] via INTRAMUSCULAR

## 2022-09-27 NOTE — Progress Notes (Signed)
Dysport-500 units X1 vial [id number] Exp: 03/16/2024 NDC:[address]-1  Bacteriostatic 0.9% Sodium Chloride- 4 mL total Lot: [id number] Expiration: 10/04/2022 NDC: 55732-202-54  Dx: G24.4. S/P Witnessed by Berneice Gandy RMA

## 2022-09-30 NOTE — Progress Notes (Addendum)
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  Symptoms started over a year ago with pain in the jaw muscles, limited mouth opening to where she had significantly decreased intake and weight loss, also dysarthria and temporomandibular joint problems due to the symptoms.  They were impairing her daily activities with social embarrassment, inability to work, weight loss and forming a significant impact on the overall quality of life of the patient.  Inability to open her jaw limits the maximal mouth opening and hampers speech, mastication/eating.  Medications tried: Patient's been under the care of neurology, primary care, she has tried physical therapy, massage, chiropractic, analgesics, muscle relaxer such as Flexeril and baclofen which gave her significant side effects such as sedation.  These were tried over a 88-month period without reduction of pain or quality of life or range of motion.  Response: She is much improved, feels functionality is better, decreased pain, increased range of motion of jaw, can eat without pain, can open her mouth/jaw wide.  Side effects none   reviewed w/ pt the procedure of botulinum toxin, incl side effects - localized weakness, inj site rxn, myalgia and spread from site of injection   Procedure note   EMG: EMG guidance was not used today to inject muscles detailed below but may be used in the future. Aseptic procedure was performed and patient tolerated procedure. Procedure was performed by Dr. Azell Der   A 31 gauge needle was used to inject  Patient tolerated the procedure.    Refractory to oral medications  Adjusted dose today to optimize response   Motrin / tylenol for injections site pain / soreness   REMS precautions handout given to patient   RTC - see instructions for  details   Dysport 500Units [address]-6 250U bilaterally was placed in 5 locations in each masseter bilaterally.  Units Injected 500 , Units wasted 0 , Units billed 500   j code [id number]  Facial injections [id number]  Naomie Dean, MD  [facility]  [address]  [place], [place] [address]  Phone [phone] Fax [phone]

## 2022-09-30 NOTE — Telephone Encounter (Signed)
Faxed [employer] dysport support application to [employer]. The form was signed by the patient on 09/27/22. Received a receipt of confirmation.

## 2022-10-01 ENCOUNTER — Other Ambulatory Visit (HOSPITAL_COMMUNITY): Payer: Self-pay

## 2022-10-03 ENCOUNTER — Other Ambulatory Visit (HOSPITAL_COMMUNITY): Payer: Self-pay

## 2022-10-04 ENCOUNTER — Other Ambulatory Visit (HOSPITAL_COMMUNITY): Payer: Self-pay

## 2022-10-07 NOTE — Telephone Encounter (Signed)
   PT is also covered now under medical benefit if this should be a better option financially for the PT-PT might want to discuss with insurance.

## 2022-10-10 ENCOUNTER — Other Ambulatory Visit: Payer: Self-pay | Admitting: Obstetrics & Gynecology

## 2022-10-10 ENCOUNTER — Other Ambulatory Visit (HOSPITAL_COMMUNITY): Payer: Self-pay

## 2022-10-10 DIAGNOSIS — Z30015 Encounter for initial prescription of vaginal ring hormonal contraceptive: Secondary | ICD-10-CM

## 2022-10-10 MED ORDER — ETONOGESTREL-ETHINYL ESTRADIOL 0.12-0.015 MG/24HR VA RING
VAGINAL_RING | VAGINAL | 12 refills | Status: DC
  Filled 2022-10-10: qty 1, 28d supply, fill #0

## 2022-10-10 NOTE — Progress Notes (Signed)
Desires salpingectomy, for now desires vaginal ring Rx sent in  Myna Hidalgo, DO Attending Obstetrician & Gynecologist, [facility] for [employer], [facility]

## 2022-10-18 DIAGNOSIS — M545 Low back pain, unspecified: Secondary | ICD-10-CM | POA: Diagnosis not present

## 2022-10-21 ENCOUNTER — Other Ambulatory Visit (HOSPITAL_COMMUNITY): Payer: Self-pay

## 2022-11-04 ENCOUNTER — Telehealth: Payer: Self-pay | Admitting: Neurology

## 2022-11-04 NOTE — Telephone Encounter (Signed)
Could you please add her to my schedule this Wednesday at 430 no charge for dysport touch up please?

## 2022-11-04 NOTE — Telephone Encounter (Signed)
Pt scheduled for dysport injection for 11/06/22 at 4:30pm

## 2022-11-05 NOTE — Telephone Encounter (Signed)
I called the pt and LVM (ok per DPR) asking for call back or mychart message to let us know if patient heard back about being accepted in Dysport savings program. I don't see any updates so far in chart.

## 2022-11-05 NOTE — Telephone Encounter (Signed)
Did you ever hear anything back about a copay card? [facility] won't send her replacement until they have it.

## 2022-11-06 ENCOUNTER — Ambulatory Visit (INDEPENDENT_AMBULATORY_CARE_PROVIDER_SITE_OTHER): Payer: Self-pay | Admitting: Neurology

## 2022-11-06 DIAGNOSIS — G244 Idiopathic orofacial dystonia: Secondary | ICD-10-CM

## 2022-11-06 MED ORDER — ABOBOTULINUMTOXINA 500 UNITS IM SOLR: 167.0000 [IU] | Freq: Once | INTRAMUSCULAR | Status: AC

## 2022-11-06 NOTE — Progress Notes (Signed)
[facility] NDC [address]-2 Lot [id number] Exp 06/16/2024 Sample.   Normal Saline Bacteriostatic 0.9%   3ml NDC 3244-0102-72 LOT [id number] Exp 04/012025

## 2022-11-06 NOTE — Patient Instructions (Addendum)
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  Symptoms started over a year ago with pain in the jaw muscles, limited mouth opening to where she had significantly decreased intake and weight loss, also dysarthria and temporomandibular joint problems due to the symptoms.  They were impairing her daily activities with social embarrassment, inability to work, weight loss and forming a significant impact on the overall quality of life of the patient.  Inability to open her jaw limits the maximal mouth opening and hampers speech, mastication/eating.  Medications tried: Patient's been under the care of neurology, primary care, she has tried physical therapy, massage, chiropractic, analgesics, muscle relaxer such as Flexeril and baclofen which gave her significant side effects such as sedation.  These were tried over a 68-month period without reduction of pain or quality of life or range of motion.  Response: She is much improved, feels functionality is better, decreased pain, increased range of motion of jaw, can eat without pain, can open her mouth/jaw wide.  Side effects none   reviewed w/ pt the procedure of botulinum toxin, incl side effects - localized weakness, inj site rxn, myalgia and spread from site of injection   Procedure note   EMG: EMG guidance was not used today to inject muscles detailed below but may be used in the future. Aseptic procedure was performed and patient tolerated procedure. Procedure was performed by Dr. Azell Der   A 31 gauge needle was used to inject  Patient tolerated the procedure.    Refractory to oral medications  Adjusted dose today to optimize response    Motrin / tylenol for injections site pain / soreness   REMS precautions handout given to patient   RTC - see instructions for details   Dysport 500Units [address]-6 166U bilaterally was placed in 5 locations total both masseter bilaterally.  Units Injected 166 samples Units wasted 0 , Units billed 0   j code [id number]  Facial injections [id number] NO CHARGE  Naomie Dean, MD  [facility]  [address]  [place], [place] [address]  Phone [phone] Fax [phone]

## 2022-11-08 DIAGNOSIS — M545 Low back pain, unspecified: Secondary | ICD-10-CM | POA: Diagnosis not present

## 2022-11-20 ENCOUNTER — Other Ambulatory Visit (HOSPITAL_COMMUNITY): Payer: Self-pay

## 2022-11-20 ENCOUNTER — Telehealth: Payer: Self-pay | Admitting: Obstetrics & Gynecology

## 2022-11-20 DIAGNOSIS — N393 Stress incontinence (female) (male): Secondary | ICD-10-CM

## 2022-11-20 DIAGNOSIS — N760 Acute vaginitis: Secondary | ICD-10-CM

## 2022-11-20 DIAGNOSIS — Z30015 Encounter for initial prescription of vaginal ring hormonal contraceptive: Secondary | ICD-10-CM

## 2022-11-20 MED ORDER — ETONOGESTREL-ETHINYL ESTRADIOL 0.12-0.015 MG/24HR VA RING
VAGINAL_RING | VAGINAL | 4 refills | Status: DC
  Filled 2022-11-20: qty 3, 84d supply, fill #0

## 2022-11-20 MED ORDER — METRONIDAZOLE 0.75 % VA GEL
1.0000 | Freq: Every day | VAGINAL | 3 refills | Status: AC
  Filled 2022-11-22 (×2): qty 70, 7d supply, fill #0

## 2022-11-20 MED ORDER — METRONIDAZOLE 0.75 % VA GEL
1.0000 | Freq: Every day | VAGINAL | 3 refills | Status: DC
  Filled 2022-11-20: qty 70, 5d supply, fill #0

## 2022-11-20 NOTE — Telephone Encounter (Signed)
Pt called in noting symptoms of BV.  States she has had symptoms in the past and this feels similar.  Additionally, needs refill for Nuvaring.  At her last visit, we had briefly discussed pelvic floor therapy.  At this time, she does in fact wish to proceed.  Referral created  Prescriptions sent

## 2022-11-20 NOTE — Addendum Note (Signed)
Addended by: Sharon Seller on: 11/20/2022 11:47 AM   Modules accepted: Orders

## 2022-11-22 ENCOUNTER — Other Ambulatory Visit (HOSPITAL_COMMUNITY): Payer: Self-pay

## 2022-11-22 ENCOUNTER — Other Ambulatory Visit (HOSPITAL_BASED_OUTPATIENT_CLINIC_OR_DEPARTMENT_OTHER): Payer: Self-pay

## 2022-11-23 ENCOUNTER — Other Ambulatory Visit (HOSPITAL_BASED_OUTPATIENT_CLINIC_OR_DEPARTMENT_OTHER): Payer: Self-pay

## 2022-12-12 ENCOUNTER — Other Ambulatory Visit (HOSPITAL_COMMUNITY): Payer: Self-pay

## 2023-01-02 ENCOUNTER — Encounter: Payer: Self-pay | Admitting: Obstetrics & Gynecology

## 2023-02-06 ENCOUNTER — Telehealth: Payer: Self-pay | Admitting: Obstetrics & Gynecology

## 2023-02-06 NOTE — Telephone Encounter (Signed)
Called pt to review upcoming surgery on 9/3  -pt ok with robotic-assisted -Reviewed benefits and risks including but not limited to risk of bleeding, infection and injury to surrounding organs such as bowel bladder or ureters.  Discussed potential need for further surgery should complications ensue. -Patient currently using NuvaRing for contraception and still notes heavy menses.  She previously had the Mirena and noted significant improvement of her period and wishes to return to the [place].  However due to her ectopic pregnancy she wishes to pursue permanent sterilization -Will complete laparoscopic salpingectomy as well as Mirena placement for management of menses -Questions and concerns were addressed -Will send updated surgical referral  Myna Hidalgo, DO Attending Obstetrician & Gynecologist, [facility] for [employer], [facility]

## 2023-02-10 ENCOUNTER — Other Ambulatory Visit: Payer: Self-pay

## 2023-02-10 ENCOUNTER — Other Ambulatory Visit (HOSPITAL_COMMUNITY): Payer: Self-pay

## 2023-02-11 ENCOUNTER — Other Ambulatory Visit (HOSPITAL_COMMUNITY): Payer: Self-pay

## 2023-02-11 ENCOUNTER — Other Ambulatory Visit: Payer: Self-pay

## 2023-02-12 ENCOUNTER — Ambulatory Visit (INDEPENDENT_AMBULATORY_CARE_PROVIDER_SITE_OTHER): Payer: 59 | Admitting: Neurology

## 2023-02-12 DIAGNOSIS — G244 Idiopathic orofacial dystonia: Secondary | ICD-10-CM

## 2023-02-12 MED ORDER — ABOBOTULINUMTOXINA 500 UNITS IM SOLR
500.0000 [IU] | Freq: Once | INTRAMUSCULAR | Status: AC
Start: 2023-02-12 — End: ?

## 2023-02-12 NOTE — Progress Notes (Signed)
Dysport 500 units x 1 vial Lot: [id number] Expiration: 08/14/2024 NDC: [address]-2  Bacteriostatic 0.9% Sodium Chloride- 3 mL  Lot: [id number] Expiration: 09/16/2023 NDC: 4403-4742-59  Dx: Regan Rakers S/P Witnessed by Clemencia Course RN

## 2023-02-12 NOTE — Pre-Procedure Instructions (Signed)
Spoke with [employer] in pharmacy. He is going to order Mirena IUD for Tuesday.

## 2023-02-12 NOTE — Patient Instructions (Addendum)
Beverly Gonzalez  02/12/2023     @PREFPERIOPPHARMACY @   Your procedure is scheduled on  02/18/2023.   Report to [facility] at  0600 A.M.   Call this number if you have problems the morning of surgery:  [phone]  If you experience any cold or flu symptoms such as cough, fever, chills, shortness of breath, etc. between now and your scheduled surgery, please notify us at the above number.   Remember:  Do not eat or drink after midnight.      Take these medicines the morning of surgery with A SIP OF WATER                                                sertraline.    Do not wear jewelry, make-up or nail polish, including gel polish,  artificial nails, or any other type of covering on natural nails (fingers and  toes).  Do not wear lotions, powders, or perfumes, or deodorant.  Do not shave 48 hours prior to surgery.  Men may shave face and neck.  Do not bring valuables to the hospital.  [facility] is not responsible for any belongings or valuables.  Contacts, dentures or bridgework may not be worn into surgery.  Leave your suitcase in the car.  After surgery it may be brought to your room.  For patients admitted to the hospital, discharge time will be determined by your treatment team.  Patients discharged the day of surgery will not be allowed to drive home and must have someone with them for 24 hours.    Special instructions:   DO NOT smoke tobacco or vape for 24 hours before your procedure.  Please read over the following fact sheets that you were given. Pain Booklet, Coughing and Deep Breathing, Surgical Site Infection Prevention, Anesthesia Post-op Instructions, and Care and Recovery After Surgery        Salpingectomy, Care After The following information offers guidance on how to care for yourself after your procedure. Your health care provider may also give you more specific instructions. If you have problems or questions, contact your health care  provider. What can I expect after the procedure? After the procedure, it is common to have: Pain in your abdomen. Light vaginal bleeding (spotting) for a few days. Tiredness. Your recovery time will depend on which method was used for your surgery. Follow these instructions at home: Medicines Take over-the-counter and prescription medicines only as told by your health care provider. Ask your health care provider if the medicine prescribed to you: Requires you to avoid driving or using machinery. Can cause constipation. You may need to take actions to prevent or treat constipation, such as: Drink enough fluid to keep your urine pale yellow. Take over-the-counter or prescription medicines. Eat foods that are high in fiber, such as beans, whole grains, and fresh fruits and vegetables. Limit foods that are high in fat and processed sugars, such as fried or sweet foods. Incision care  Follow instructions from your health care provider about how to take care of your incision or incisions. Make sure you: Wash your hands with soap and water for at least 20 seconds before and after you change your bandage (dressing). If soap and water are not available, use hand sanitizer. Change or remove your dressing as told by your  health care provider. Leave stitches (sutures), skin glue, staples, or adhesive strips in place. These skin closures may need to stay in place for 2 weeks or longer. If adhesive strip edges start to loosen and curl up, you may trim the loose edges. Do not remove adhesive strips completely unless your health care provider tells you to do that. Keep your dressing clean and dry. Check your incision area every day for signs of infection. Check for: Redness, swelling, or pain that gets worse. Fluid or blood. Warmth. Pus or a bad smell. Activity Rest as told by your health care provider. Avoid sitting for a long time without moving. Get up to take short walks every 1-2 hours. This is  important to improve blood flow and breathing. Ask for help if you feel weak or unsteady. Return to your normal activities as told by your health care provider. Ask your health care provider what activities are safe for you. Do not drive until your health care provider says that it is safe. Do not lift anything that is heavier than 10 lb (4.5 kg), or the limit that you are told, until your health care provider says that it is safe. This may last for 2-6 weeks depending on your surgery. Do not douche, use tampons, or have sex until your health care provider approves. General instructions Do not use any products that contain nicotine or tobacco. These products include cigarettes, chewing tobacco, and vaping devices, such as e-cigarettes. These can delay healing after surgery. If you need help quitting, ask your health care provider. Wear compression stockings as told by your health care provider. These stockings help to prevent blood clots and reduce swelling in your legs. Do not take baths, swim, or use a hot tub until your health care provider approves. You may take showers. Keep all follow-up visits. This is important. Contact a health care provider if: You have pain when you urinate. You have redness, swelling, or more pain around an incision or an incision feels warm to the touch. You have pus, fluid, blood, or a bad smell coming from an incision or an incision starts to open. You have a fever. You have abdominal pain that gets worse or does not get better with medicine. You have a rash. You feel light-headed, have nausea and vomiting, or both. Get help right away if: You have pain in your chest or leg. You develop shortness of breath. You faint. You have increased or heavy vaginal bleeding, such as soaking a sanitary napkin in an hour. These symptoms may represent a serious problem that is an emergency. Do not wait to see if the symptoms will go away. Get medical help right away. Call your  local emergency services (911 in the [place] Do not drive yourself to the hospital. Summary After the procedure, it is common to feel tired, have pain in your abdomen, and have light vaginal bleeding for a few days. Follow instructions from your health care provider about how to take care of your incision or incisions. Return to your normal activities as told by your health care provider. Ask your health care provider what activities are safe for you. Do not douche, use tampons, or have sex until your health care provider approves. Keep all follow-up visits. This is important. This information is not intended to replace advice given to you by your health care provider. Make sure you discuss any questions you have with your health care provider. Document Revised: 04/24/2020 Document Reviewed: 04/25/2020 Elsevier Patient  Education  2024 [employer] General Anesthesia, Adult, Care After The following information offers guidance on how to care for yourself after your procedure. Your health care provider may also give you more specific instructions. If you have problems or questions, contact your health care provider. What can I expect after the procedure? After the procedure, it is common for people to: Have pain or discomfort at the IV site. Have nausea or vomiting. Have a sore throat or hoarseness. Have trouble concentrating. Feel cold or chills. Feel weak, sleepy, or tired (fatigue). Have soreness and body aches. These can affect parts of the body that were not involved in surgery. Follow these instructions at home: For the time period you were told by your health care provider:  Rest. Do not participate in activities where you could fall or become injured. Do not drive or use machinery. Do not drink alcohol. Do not take sleeping pills or medicines that cause drowsiness. Do not make important decisions or sign legal documents. Do not take care of children on your own. General  instructions Drink enough fluid to keep your urine pale yellow. If you have sleep apnea, surgery and certain medicines can increase your risk for breathing problems. Follow instructions from your health care provider about wearing your sleep device: Anytime you are sleeping, including during daytime naps. While taking prescription pain medicines, sleeping medicines, or medicines that make you drowsy. Return to your normal activities as told by your health care provider. Ask your health care provider what activities are safe for you. Take over-the-counter and prescription medicines only as told by your health care provider. Do not use any products that contain nicotine or tobacco. These products include cigarettes, chewing tobacco, and vaping devices, such as e-cigarettes. These can delay incision healing after surgery. If you need help quitting, ask your health care provider. Contact a health care provider if: You have nausea or vomiting that does not get better with medicine. You vomit every time you eat or drink. You have pain that does not get better with medicine. You cannot urinate or have bloody urine. You develop a skin rash. You have a fever. Get help right away if: You have trouble breathing. You have chest pain. You vomit blood. These symptoms may be an emergency. Get help right away. Call 911. Do not wait to see if the symptoms will go away. Do not drive yourself to the hospital. Summary After the procedure, it is common to have a sore throat, hoarseness, nausea, vomiting, or to feel weak, sleepy, or fatigue. For the time period you were told by your health care provider, do not drive or use machinery. Get help right away if you have difficulty breathing, have chest pain, or vomit blood. These symptoms may be an emergency. This information is not intended to replace advice given to you by your health care provider. Make sure you discuss any questions you have with your health  care provider. Document Revised: 08/31/2021 Document Reviewed: 08/31/2021 Elsevier Patient Education  2024 [employer] How to Use Chlorhexidine Before Surgery Chlorhexidine gluconate (CHG) is a germ-killing (antiseptic) solution that is used to clean the skin. It can get rid of the bacteria that normally live on the skin and can keep them away for about 24 hours. To clean your skin with CHG, you may be given: A CHG solution to use in the shower or as part of a sponge bath. A prepackaged cloth that contains CHG. Cleaning your skin with CHG may help lower  the risk for infection: While you are staying in the intensive care unit of the hospital. If you have a vascular access, such as a central line, to provide short-term or long-term access to your veins. If you have a catheter to drain urine from your bladder. If you are on a ventilator. A ventilator is a machine that helps you breathe by moving air in and out of your lungs. After surgery. What are the risks? Risks of using CHG include: A skin reaction. Hearing loss, if CHG gets in your ears and you have a perforated eardrum. Eye injury, if CHG gets in your eyes and is not rinsed out. The CHG product catching fire. Make sure that you avoid smoking and flames after applying CHG to your skin. Do not use CHG: If you have a chlorhexidine allergy or have previously reacted to chlorhexidine. On babies younger than 27 months of age. How to use CHG solution Use CHG only as told by your health care provider, and follow the instructions on the label. Use the full amount of CHG as directed. Usually, this is one bottle. During a shower Follow these steps when using CHG solution during a shower (unless your health care provider gives you different instructions): Start the shower. Use your normal soap and shampoo to wash your face and hair. Turn off the shower or move out of the shower stream. Pour the CHG onto a clean washcloth. Do not use any type  of brush or rough-edged sponge. Starting at your neck, lather your body down to your toes. Make sure you follow these instructions: If you will be having surgery, pay special attention to the part of your body where you will be having surgery. Scrub this area for at least 1 minute. Do not use CHG on your head or face. If the solution gets into your ears or eyes, rinse them well with water. Avoid your genital area. Avoid any areas of skin that have broken skin, cuts, or scrapes. Scrub your back and under your arms. Make sure to wash skin folds. Let the lather sit on your skin for 1-2 minutes or as long as told by your health care provider. Thoroughly rinse your entire body in the shower. Make sure that all body creases and crevices are rinsed well. Dry off with a clean towel. Do not put any substances on your body afterward--such as powder, lotion, or perfume--unless you are told to do so by your health care provider. Only use lotions that are recommended by the manufacturer. Put on clean clothes or pajamas. If it is the night before your surgery, sleep in clean sheets.  During a sponge bath Follow these steps when using CHG solution during a sponge bath (unless your health care provider gives you different instructions): Use your normal soap and shampoo to wash your face and hair. Pour the CHG onto a clean washcloth. Starting at your neck, lather your body down to your toes. Make sure you follow these instructions: If you will be having surgery, pay special attention to the part of your body where you will be having surgery. Scrub this area for at least 1 minute. Do not use CHG on your head or face. If the solution gets into your ears or eyes, rinse them well with water. Avoid your genital area. Avoid any areas of skin that have broken skin, cuts, or scrapes. Scrub your back and under your arms. Make sure to wash skin folds. Let the lather sit on your skin  for 1-2 minutes or as long as told by  your health care provider. Using a different clean, wet washcloth, thoroughly rinse your entire body. Make sure that all body creases and crevices are rinsed well. Dry off with a clean towel. Do not put any substances on your body afterward--such as powder, lotion, or perfume--unless you are told to do so by your health care provider. Only use lotions that are recommended by the manufacturer. Put on clean clothes or pajamas. If it is the night before your surgery, sleep in clean sheets. How to use CHG prepackaged cloths Only use CHG cloths as told by your health care provider, and follow the instructions on the label. Use the CHG cloth on clean, dry skin. Do not use the CHG cloth on your head or face unless your health care provider tells you to. When washing with the CHG cloth: Avoid your genital area. Avoid any areas of skin that have broken skin, cuts, or scrapes. Before surgery Follow these steps when using a CHG cloth to clean before surgery (unless your health care provider gives you different instructions): Using the CHG cloth, vigorously scrub the part of your body where you will be having surgery. Scrub using a back-and-forth motion for 3 minutes. The area on your body should be completely wet with CHG when you are done scrubbing. Do not rinse. Discard the cloth and let the area air-dry. Do not put any substances on the area afterward, such as powder, lotion, or perfume. Put on clean clothes or pajamas. If it is the night before your surgery, sleep in clean sheets.  For general bathing Follow these steps when using CHG cloths for general bathing (unless your health care provider gives you different instructions). Use a separate CHG cloth for each area of your body. Make sure you wash between any folds of skin and between your fingers and toes. Wash your body in the following order, switching to a new cloth after each step: The front of your neck, shoulders, and chest. Both of your  arms, under your arms, and your hands. Your stomach and groin area, avoiding the genitals. Your right leg and foot. Your left leg and foot. The back of your neck, your back, and your buttocks. Do not rinse. Discard the cloth and let the area air-dry. Do not put any substances on your body afterward--such as powder, lotion, or perfume--unless you are told to do so by your health care provider. Only use lotions that are recommended by the manufacturer. Put on clean clothes or pajamas. Contact a health care provider if: Your skin gets irritated after scrubbing. You have questions about using your solution or cloth. You swallow any chlorhexidine. Call your local poison control center ([phone] in the [place] Get help right away if: Your eyes itch badly, or they become very red or swollen. Your skin itches badly and is red or swollen. Your hearing changes. You have trouble seeing. You have swelling or tingling in your mouth or throat. You have trouble breathing. These symptoms may represent a serious problem that is an emergency. Do not wait to see if the symptoms will go away. Get medical help right away. Call your local emergency services (911 in the [place] Do not drive yourself to the hospital. Summary Chlorhexidine gluconate (CHG) is a germ-killing (antiseptic) solution that is used to clean the skin. Cleaning your skin with CHG may help to lower your risk for infection. You may be given CHG to use for bathing. It  may be in a bottle or in a prepackaged cloth to use on your skin. Carefully follow your health care provider's instructions and the instructions on the product label. Do not use CHG if you have a chlorhexidine allergy. Contact your health care provider if your skin gets irritated after scrubbing. This information is not intended to replace advice given to you by your health care provider. Make sure you discuss any questions you have with your health care provider. Document  Revised: 10/01/2021 Document Reviewed: 08/14/2020 Elsevier Patient Education  2023 [employer].

## 2023-02-12 NOTE — Progress Notes (Signed)
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  Symptoms started over a year ago with pain in the jaw muscles, limited mouth opening to where she had significantly decreased intake and weight loss, also dysarthria and temporomandibular joint problems due to the symptoms.  They were impairing her daily activities with social embarrassment, inability to work, weight loss and forming a significant impact on the overall quality of life of the patient.  Inability to open her jaw limits the maximal mouth opening and hampers speech, mastication/eating.  Medications tried: Patient's been under the care of neurology, primary care, she has tried physical therapy, massage, chiropractic, analgesics, muscle relaxer such as Flexeril and baclofen which gave her significant side effects such as sedation.  These were tried over a 31-month period without reduction of pain or quality of life or range of motion.  Response: She is much improved, feels functionality is better, decreased pain, increased range of motion of jaw, can eat without pain, can open her mouth/jaw wide.  Side effects none   reviewed w/ pt the procedure of botulinum toxin, incl side effects - localized weakness, inj site rxn, myalgia and spread from site of injection   Procedure note   EMG: EMG guidance was not used today to inject muscles detailed below but may be used in the future. Aseptic procedure was performed and patient tolerated procedure. Procedure was performed by Dr. Azell Der   A 31 gauge needle was used to inject  Patient tolerated the procedure.    Refractory to oral medications  Adjusted dose today to optimize response   Motrin / tylenol for  injections site pain / soreness   REMS precautions handout given to patient   RTC - see instructions for details   Dysport 500Units [address]-6 250U bilaterally was placed in 5 locations in each masseter bilaterally.  Units Injected 500 , Units wasted 0 , Units billed 500   j code [id number]  Facial injections [id number]  Naomie Dean, MD  [facility]  [address]  [place], [place] [address]  Phone [phone] Fax [phone]

## 2023-02-13 ENCOUNTER — Encounter (HOSPITAL_COMMUNITY): Payer: Self-pay

## 2023-02-13 ENCOUNTER — Encounter (HOSPITAL_COMMUNITY)
Admission: RE | Admit: 2023-02-13 | Discharge: 2023-02-13 | Disposition: A | Discharge status: Home / Self Care | Payer: 59 | Source: Ambulatory Visit | Attending: Obstetrics & Gynecology | Admitting: Obstetrics & Gynecology

## 2023-02-13 DIAGNOSIS — Z01818 Encounter for other preprocedural examination: Secondary | ICD-10-CM

## 2023-02-13 DIAGNOSIS — Z01812 Encounter for preprocedural laboratory examination: Secondary | ICD-10-CM | POA: Insufficient documentation

## 2023-02-13 DIAGNOSIS — Z302 Encounter for sterilization: Secondary | ICD-10-CM

## 2023-02-13 LAB — PREGNANCY, URINE: Preg Test, Ur: NEGATIVE

## 2023-02-13 LAB — CBC
HCT: 40.8 % (ref 36.0–46.0)
Hemoglobin: 13.7 g/dL (ref 12.0–15.0)
MCH: 32.2 pg (ref 26.0–34.0)
MCHC: 33.6 g/dL (ref 30.0–36.0)
MCV: 96 fL (ref 80.0–100.0)
Platelets: 206 10*3/uL (ref 150–400)
RBC: 4.25 MIL/uL (ref 3.87–5.11)
RDW: 12.1 % (ref 11.5–15.5)
WBC: 3.7 10*3/uL — ABNORMAL LOW (ref 4.0–10.5)
nRBC: 0 % (ref 0.0–0.2)

## 2023-02-14 ENCOUNTER — Other Ambulatory Visit: Payer: Self-pay

## 2023-02-14 ENCOUNTER — Telehealth: Payer: Self-pay | Admitting: Neurology

## 2023-02-14 ENCOUNTER — Ambulatory Visit: Payer: 59 | Admitting: Physical Therapy

## 2023-02-14 DIAGNOSIS — R279 Unspecified lack of coordination: Secondary | ICD-10-CM | POA: Insufficient documentation

## 2023-02-14 DIAGNOSIS — R293 Abnormal posture: Secondary | ICD-10-CM | POA: Insufficient documentation

## 2023-02-14 DIAGNOSIS — M6281 Muscle weakness (generalized): Secondary | ICD-10-CM | POA: Diagnosis not present

## 2023-02-14 NOTE — Addendum Note (Signed)
Addended by: Barbaraann Faster on: 02/14/2023 09:15 AM   Modules accepted: Orders

## 2023-02-14 NOTE — Therapy (Signed)
OUTPATIENT PHYSICAL THERAPY FEMALE PELVIC EVALUATION   Patient Name: Beverly Gonzalez MRN: [id number] DOB:01-Apr-1986, 37 y.o., female Today's Date: 02/14/2023  END OF SESSION:  PT End of Session - 02/14/23 0759     Visit Number 1    Date for PT Re-Evaluation 06/16/23    Authorization Type [employer]    PT Start Time 0800    PT Stop Time 0842    PT Time Calculation (min) 42 min    Activity Tolerance Patient tolerated treatment well    Behavior During Therapy [place] for tasks assessed/performed             Past Medical History:  Diagnosis Date   Anxiety    Severe preeclampsia, third trimester 11/18/2016   Vaginal Pap smear, abnormal    Past Surgical History:  Procedure Laterality Date   CESAREAN SECTION N/A 11/20/2016   Procedure: CESAREAN SECTION;  Surgeon: Myna Hidalgo, DO;  [facility]: [facility];  Service: Obstetrics;  Laterality: N/A;   COLONOSCOPY     DIAGNOSTIC LAPAROSCOPY WITH REMOVAL OF ECTOPIC PREGNANCY Left 04/02/2021   Procedure: DIAGNOSTIC LAPAROSCOPY, EVACUATION OF HEMOPERITONEUM;  Surgeon: Lazaro Arms, MD;  Location: [facility];  Service: Gynecology;  Laterality: Left;   TONSILLECTOMY     WISDOM TOOTH EXTRACTION     Patient Active Problem List   Diagnosis Date Noted   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   Normal intrauterine pregnancy in third trimester 04/08/2019   Generalized anxiety disorder 04/03/2018   History of poor pregnancy outcome 04/24/2017   HELLP (hemolytic anemia/elev liver enzymes/low platelets in pregnancy) 11/18/2016    PCP: Anne Shutter, MD  REFERRING PROVIDER: Myna Hidalgo, DO   REFERRING DIAG: N39.3 (ICD-10-CM) - Stress incontinence  THERAPY DIAG:  Muscle weakness (generalized)  Abnormal posture  Unspecified lack of coordination  Rationale for Evaluation and Treatment: Rehabilitation  ONSET DATE: 1 year  SUBJECTIVE:                                                                                                                                                                                            SUBJECTIVE STATEMENT: Did PFPT 4 years ago and helped a lot, but now unable to jump without leakage. About to have surgery Tuesday last time had surgery had severe tailbone pain.    PAIN:  Are you having pain? No - did have severe pain in 2022 post previous surgery.    PRECAUTIONS: None  RED FLAGS: None   WEIGHT BEARING RESTRICTIONS: No  FALLS:  Has patient fallen in last 6 months? No  LIVING ENVIRONMENT: Lives with: lives with their family Lives in: House/apartment  OCCUPATION: [employer]   PLOF: Independent  PATIENT GOALS: to have less leakage and prevent pain with upcoming surgery   PERTINENT HISTORY:  Anxiety, x2 pregnancy, vaginal birth, c-section Sexual abuse: No  BOWEL MOVEMENT: Pain with bowel movement: No Type of bowel movement:Type (Bristol Stool Scale) 4, Frequency regular , and Strain No Fully empty rectum: Yes:   Leakage: No Pads: No Fiber supplement: No  URINATION: Pain with urination: No Fully empty bladder: No Stream: Strong Urgency: No Frequency: not quicker than every 2 hours, 1 night Leakage:  jumping  - does HIIT a lot and only has leakage with jumping, needs to empty during workouts Pads: No  INTERCOURSE: Pain with intercourse:  not painful Ability to have vaginal penetration:  Yes:   Climax: not painful Marinoff Scale: 0/3  PREGNANCY: Vaginal deliveries 1 Tearing Yes: labial tear C-section deliveries 1 Currently pregnant No  PROLAPSE: None   OBJECTIVE:   DIAGNOSTIC FINDINGS:    COGNITION: Overall cognitive status: Within functional limits for tasks assessed     SENSATION: Light touch: Appears intact Proprioception: Appears intact  MUSCLE LENGTH: Bil hamstrings and adductors limited by 25%   FUNCTIONAL TESTS:  Functional squat - able to complete fully, mild bil knee valgus  POSTURE: rounded shoulders  and anterior pelvic tilt  PELVIC ALIGNMENT: WFL  LUMBARAROM/PROM:  A/PROM A/PROM  eval  Flexion WFL  Extension WFL  Right lateral flexion WFL  Left lateral flexion WFL  Right rotation Limited by 25%  Left rotation Limited by 25%   (Blank rows = not tested)  LOWER EXTREMITY ROM:  WFL  LOWER EXTREMITY MMT:  Bil hip abduction 3/5 all other hips 4/5; knees 5/5  PALPATION:   General  mild tightness in lumbar; no TTP                External Perineal Exam WFL no TTP                             Internal Pelvic Floor no TTP  Patient confirms identification and approves PT to assess internal pelvic floor and treatment Yes No emotional/communication barriers or cognitive limitation. Patient is motivated to learn. Patient understands and agrees with treatment goals and plan. PT explains patient will be examined in standing, sitting, and lying down to see how their muscles and joints work. When they are ready, they will be asked to remove their underwear so PT can examine their perineum. The patient is also given the option of providing their own chaperone as one is not provided in our facility. The patient also has the right and is explained the right to defer or refuse any part of the evaluation or treatment including the internal exam. With the patient's consent, PT will use one gloved finger to gently assess the muscles of the pelvic floor, seeing how well it contracts and relaxes and if there is muscle symmetry. After, the patient will get dressed and PT and patient will discuss exam findings and plan of care. PT and patient discuss plan of care, schedule, attendance policy and HEP activities.   PELVIC MMT:   MMT eval  Vaginal 0/5  Internal Anal Sphincter   External Anal Sphincter   Puborectalis   Diastasis Recti 1 finger separation throughout with full crunch, bulge noted. Cued for TA pre-contraction to crunch with no noted bulge   (Blank rows = not tested)  TONE: Slightly increased   PROLAPSE: Not seen in hooklying with cough   TODAY'S TREATMENT:                                                                                                                              DATE:   02/14/23 EVAL Examination completed, findings reviewed, pt educated on POC, HEP. Pt motivated to participate in PT and agreeable to attempt recommendations.     PATIENT EDUCATION:  Education details: Person educated: Patient Education method: Solicitor, Actor cues, Verbal cues, and Handouts Education comprehension: verbalized understanding and returned demonstration  HOME EXERCISE PROGRAM:  ASSESSMENT:  CLINICAL IMPRESSION: Patient is a 37 y.o. female  who was seen today for physical therapy evaluation and treatment for stress incontinence with jumping. Pt also fearful of return of proctalgia as she had this with previous abdominal surgery and has plans for SALPINGECTOMY Tuesday. Pt is very active and works out almost daily. Expresses no other concerns at this time. Pt found to have mild bil flexibility in spine and bil hips, mild hip weakness and core weakness. Pt benefited from cues for improved TA activation with crunch mechanics and decreased pattern of holding breath with muscle testing. Patient consented to internal pelvic floor assessment vaginally this date and found to have inability to contract pelvic floor with max cues and several reps. Pt states she was unable to do this consistently with previous pelvic floor PT. Pt would benefit from additional PT to further address deficits.    OBJECTIVE IMPAIRMENTS: decreased coordination, decreased endurance, decreased mobility, decreased strength, impaired flexibility, impaired tone, improper body mechanics, and postural dysfunction.   ACTIVITY LIMITATIONS: continence  PARTICIPATION LIMITATIONS:  jumping/workouts   PERSONAL FACTORS: 1 comorbidity: x2 pregnancies, 1 vaginal  and 1 c-section  are also affecting patient's functional outcome.   REHAB POTENTIAL: Good  CLINICAL DECISION MAKING: Stable/uncomplicated  EVALUATION COMPLEXITY: Low   GOALS: Goals reviewed with patient? Yes  SHORT TERM GOALS: Target date: 03/14/23  Pt to be I with HEP.  Baseline: Goal status: INITIAL   LONG TERM GOALS: Target date: 012/30/24  Pt to be I with advanced HEP.  Baseline:  Goal status: INITIAL  2.  Pt to demonstrate at least 3/5 pelvic floor strength for improved pelvic stability and decreased strain at pelvic floor/ decrease leakage.  Baseline:  Goal status: INITIAL  3.  Pt to report no more than one instances of urinary leakage with jumping in the past month for improved QOL. Baseline:  Goal status: INITIAL  4.  Pt to report no pain after abdominal surgery to return to activity once cleared by medical provider.  Baseline:  Goal status: INITIAL    PLAN:  PT FREQUENCY: 2x/week  PT DURATION: 6 weeks  PLANNED INTERVENTIONS: Therapeutic exercises, Therapeutic activity, Neuromuscular re-education, Patient/Family education, Self Care, Joint mobilization, Aquatic Therapy, Dry Needling, Spinal mobilization, Cryotherapy, Moist heat, scar mobilization, Taping, Biofeedback, Manual therapy, and Re-evaluation  PLAN  FOR NEXT SESSION: internal as needed, biofeedback, pelvic floor coordination with breathing and without/with activity, quick paced exercises, single leg strengthening, core strengthening   Otelia Sergeant, PT, DPT 08/30/249:14 AM

## 2023-02-14 NOTE — Telephone Encounter (Signed)
Can you schedule her 12 weeks out for her next dysport injections please?

## 2023-02-17 NOTE — H&P (Signed)
[facility] Obstetrics and Gynecology Attending History and Physical  Beverly Gonzalez is a 37 y.o. [id number] who presents for scheduled robotic-assisted laparoscopic bilateral salpingectomy and IUD insertion.   In review, she has completed childbearing and desires permanent sterilization.  Pt also notes HMB and had improvement of her menses with Mirena.  She wishes to have Mirena placed for management of her bleeding.  Denies any abnormal vaginal discharge, fevers, chills, sweats, dysuria, nausea, vomiting, other GI or GU symptoms or other general symptoms.  Past Medical History:  Diagnosis Date   Anxiety    Severe preeclampsia, third trimester 11/18/2016   Vaginal Pap smear, abnormal    Past Surgical History:  Procedure Laterality Date   CESAREAN SECTION N/A 11/20/2016   Procedure: CESAREAN SECTION;  Surgeon: Myna Hidalgo, DO;  [facility];  Service: Obstetrics;  Laterality: N/A;   COLONOSCOPY     DIAGNOSTIC LAPAROSCOPY WITH REMOVAL OF ECTOPIC PREGNANCY Left 04/02/2021   Procedure: DIAGNOSTIC LAPAROSCOPY, EVACUATION OF HEMOPERITONEUM;  Surgeon: Lazaro Arms, MD;  Location: [facility];  Service: Gynecology;  Laterality: Left;   TONSILLECTOMY     WISDOM TOOTH EXTRACTION     OB History  Gravida Para Term Preterm AB Living  3 2 1 1   2   SAB IAB Ectopic Multiple Live Births        0 2    # Outcome Date GA Lbr Len/2nd Weight Sex Type Anes PTL Lv  3 Gravida           2 Term 04/08/19 [redacted]w[redacted]d 04:01 / 00:10 3340 g F VBAC EPI  LIV  1 Preterm 11/20/16 [redacted]w[redacted]d  1330 g M CS-LTranv EPI  LIV     Birth Comments: HELLP  Patient denies any other pertinent gynecologic issues.  Current Facility-Administered Medications on File Prior to Encounter  Medication Dose Route Frequency Provider Last Rate Last Admin   AbobotulinumtoxinA (DYSPORT) 500 units injection 167 Units  167 Units Intramuscular Once Anson Fret, MD       AbobotulinumtoxinA (DYSPORT) 500 units injection 500 Units   500 Units Intramuscular Once Anson Fret, MD       Current Outpatient Medications on File Prior to Encounter  Medication Sig Dispense Refill   cetirizine (ZYRTEC) 10 MG tablet Take 10 mg by mouth daily.     Prenatal Vit-Fe Fumarate-FA (PRENATAL PO) Take 1 tablet by mouth daily.     sertraline (ZOLOFT) 50 MG tablet Take 1.5 tablets (75 mg total) by mouth daily. 145 tablet 1   VITAMIN D PO Take 5,000 Units by mouth daily.     etonogestrel-ethinyl estradiol (NUVARING) 0.12-0.015 MG/24HR vaginal ring Insert ring vaginally and leave in place for 3 consecutive weeks, then remove for 1 week. (Patient not taking: Reported on 02/03/2023) 3 each 4   fluconazole (DIFLUCAN) 150 MG tablet Take 1 tablet, repeat in 3 days if needed (Patient not taking: Reported on 02/03/2023) 2 tablet 2   No Known Allergies  Social History:   reports that she has never smoked. She has never used smokeless tobacco. She reports that she does not drink alcohol and does not use drugs. Family History  Problem Relation Age of Onset   Hypertension Mother    Diabetes Father    Hypertension Father    Heart disease Maternal Grandmother    Heart disease Maternal Grandfather    Other Maternal Grandfather        covid   Stroke Maternal Grandfather    Diabetes Paternal Grandfather  Heart disease Paternal Grandfather    Stroke Paternal Grandfather    Heart attack Paternal Grandfather    Factor V Leiden deficiency Paternal Grandmother     Review of Systems: Pertinent items noted in HPI and remainder of comprehensive ROS otherwise negative.  PHYSICAL EXAM: There were no vitals taken for this visit. *** CONSTITUTIONAL: Well-developed, well-nourished female in no acute distress.  SKIN: Skin is warm and dry. No rash noted. Not diaphoretic. No erythema. No pallor. NEUROLOGIC: Alert and oriented to person, place, and time. Normal reflexes, muscle tone coordination. No cranial nerve deficit noted. PSYCHIATRIC: Normal mood and  affect. Normal behavior. Normal judgment and thought content. CARDIOVASCULAR: Normal heart rate noted, regular rhythm RESPIRATORY: Effort and breath sounds normal, no problems with respiration noted ABDOMEN: Soft, nontender, nondistended. PELVIC: deferred MUSCULOSKELETAL: no calf tenderness bilaterally EXT: no edema bilaterally, normal pulses  Labs: Results for orders placed or performed during the hospital encounter of 02/13/23 (from the past 336 hour(s))  CBC   Collection Time: 02/13/23  9:17 AM  Result Value Ref Range   WBC 3.7 (L) 4.0 - 10.5 K/uL   RBC 4.25 3.87 - 5.11 MIL/uL   Hemoglobin 13.7 12.0 - 15.0 g/dL   HCT 57.3 22.0 - 25.4 %   MCV 96.0 80.0 - 100.0 fL   MCH 32.2 26.0 - 34.0 pg   MCHC 33.6 30.0 - 36.0 g/dL   RDW 27.0 62.3 - 76.2 %   Platelets 206 150 - 400 K/uL   nRBC 0.0 0.0 - 0.2 %  Pregnancy, urine   Collection Time: 02/13/23  9:17 AM  Result Value Ref Range   Preg Test, Ur NEGATIVE NEGATIVE    Assessment: Desires permanent sterilization HMB   Plan: Robotic assisted laparoscopic bilateral salpingectomy and Mirena IUD insertion -IV Toradol 30mg  -NPO -LR @ 125cc/hr -SCDs to OR -Risk/benefits and alternatives reviewed with the patient including but not limited to risk of bleeding, infection and injury to surrounding organs.  Questions and concerns were addressed and pt desires to proceed  Myna Hidalgo, DO Attending Obstetrician & Gynecologist, [facility] for [facility], [facility]

## 2023-02-18 ENCOUNTER — Ambulatory Visit (HOSPITAL_BASED_OUTPATIENT_CLINIC_OR_DEPARTMENT_OTHER): Payer: 59 | Admitting: Anesthesiology

## 2023-02-18 ENCOUNTER — Other Ambulatory Visit (HOSPITAL_BASED_OUTPATIENT_CLINIC_OR_DEPARTMENT_OTHER): Payer: Self-pay

## 2023-02-18 ENCOUNTER — Ambulatory Visit (HOSPITAL_COMMUNITY): Payer: 59 | Admitting: Anesthesiology

## 2023-02-18 ENCOUNTER — Encounter (HOSPITAL_COMMUNITY)
Admission: RE | Disposition: A | Discharge status: Home / Self Care | Payer: Self-pay | Source: Home / Self Care | Attending: Obstetrics & Gynecology

## 2023-02-18 ENCOUNTER — Ambulatory Visit (HOSPITAL_COMMUNITY)
Admission: RE | Admit: 2023-02-18 | Discharge: 2023-02-18 | Disposition: A | Discharge status: Home / Self Care | Payer: 59 | Attending: Obstetrics & Gynecology | Admitting: Obstetrics & Gynecology

## 2023-02-18 ENCOUNTER — Encounter (HOSPITAL_COMMUNITY): Payer: Self-pay | Admitting: Obstetrics & Gynecology

## 2023-02-18 DIAGNOSIS — N921 Excessive and frequent menstruation with irregular cycle: Secondary | ICD-10-CM

## 2023-02-18 DIAGNOSIS — Z01818 Encounter for other preprocedural examination: Secondary | ICD-10-CM

## 2023-02-18 DIAGNOSIS — Z302 Encounter for sterilization: Secondary | ICD-10-CM

## 2023-02-18 DIAGNOSIS — N920 Excessive and frequent menstruation with regular cycle: Secondary | ICD-10-CM | POA: Insufficient documentation

## 2023-02-18 DIAGNOSIS — Z30430 Encounter for insertion of intrauterine contraceptive device: Secondary | ICD-10-CM

## 2023-02-18 HISTORY — PX: XI ROBOTIC ASSISTED SALPINGECTOMY: SHX6824

## 2023-02-18 HISTORY — PX: INSERTION OF NON VAGINAL CONTRACEPTIVE DEVICE: SHX6253

## 2023-02-18 SURGERY — SALPINGECTOMY, ROBOT-ASSISTED
Anesthesia: General | Site: Uterus

## 2023-02-18 MED ORDER — HYDROMORPHONE HCL 1 MG/ML IJ SOLN
0.2500 mg | INTRAMUSCULAR | Status: DC | PRN
  Administered 2023-02-18: 0.5 mg via INTRAVENOUS
  Filled 2023-02-18: qty 0.5

## 2023-02-18 MED ORDER — KETOROLAC TROMETHAMINE 30 MG/ML IJ SOLN
INTRAMUSCULAR | Status: AC
  Administered 2023-02-18: 30 mg via INTRAVENOUS
  Filled 2023-02-18: qty 1

## 2023-02-18 MED ORDER — STERILE WATER FOR IRRIGATION IR SOLN
Status: DC | PRN
  Administered 2023-02-18: 500 mL

## 2023-02-18 MED ORDER — LACTATED RINGERS IV SOLN: INTRAVENOUS | Status: DC

## 2023-02-18 MED ORDER — BUPIVACAINE HCL (PF) 0.25 % IJ SOLN
INTRAMUSCULAR | Status: DC | PRN
  Administered 2023-02-18: 40 mL

## 2023-02-18 MED ORDER — ACETAMINOPHEN 325 MG PO TABS: 650.0000 mg | ORAL_TABLET | Freq: Four times a day (QID) | ORAL | Status: AC | PRN

## 2023-02-18 MED ORDER — PROPOFOL 10 MG/ML IV BOLUS
INTRAVENOUS | Status: AC
  Filled 2023-02-18: qty 20

## 2023-02-18 MED ORDER — OXYCODONE HCL 5 MG PO TABS
5.0000 mg | ORAL_TABLET | Freq: Four times a day (QID) | ORAL | 0 refills | Status: AC | PRN
Start: 2023-02-18 — End: 2023-02-23
  Filled 2023-02-18: qty 10, 3d supply, fill #0

## 2023-02-18 MED ORDER — ORAL CARE MOUTH RINSE: 15.0000 mL | Freq: Once | OROMUCOSAL | Status: AC

## 2023-02-18 MED ORDER — ROCURONIUM BROMIDE 10 MG/ML (PF) SYRINGE
PREFILLED_SYRINGE | INTRAVENOUS | Status: DC | PRN
  Administered 2023-02-18: 60 mg via INTRAVENOUS

## 2023-02-18 MED ORDER — DEXAMETHASONE SODIUM PHOSPHATE 10 MG/ML IJ SOLN
INTRAMUSCULAR | Status: AC
  Filled 2023-02-18: qty 1

## 2023-02-18 MED ORDER — GLYCOPYRROLATE PF 0.2 MG/ML IJ SOSY
PREFILLED_SYRINGE | INTRAMUSCULAR | Status: AC
  Filled 2023-02-18: qty 1

## 2023-02-18 MED ORDER — ONDANSETRON HCL 4 MG/2ML IJ SOLN: 4.0000 mg | Freq: Once | INTRAMUSCULAR | Status: DC | PRN

## 2023-02-18 MED ORDER — ONDANSETRON HCL 4 MG/2ML IJ SOLN
INTRAMUSCULAR | Status: AC
  Filled 2023-02-18: qty 2

## 2023-02-18 MED ORDER — LIDOCAINE 2% (20 MG/ML) 5 ML SYRINGE
INTRAMUSCULAR | Status: DC | PRN
  Administered 2023-02-18: 80 mg via INTRAVENOUS

## 2023-02-18 MED ORDER — BUPIVACAINE HCL (PF) 0.25 % IJ SOLN
INTRAMUSCULAR | Status: AC
  Filled 2023-02-18: qty 60

## 2023-02-18 MED ORDER — MEPERIDINE HCL 50 MG/ML IJ SOLN
6.2500 mg | INTRAMUSCULAR | Status: DC | PRN
  Administered 2023-02-18: 12.5 mg via INTRAVENOUS
  Filled 2023-02-18: qty 1

## 2023-02-18 MED ORDER — SUCCINYLCHOLINE CHLORIDE 200 MG/10ML IV SOSY
PREFILLED_SYRINGE | INTRAVENOUS | Status: DC | PRN
  Administered 2023-02-18: 100 mg via INTRAVENOUS

## 2023-02-18 MED ORDER — KETOROLAC TROMETHAMINE 30 MG/ML IJ SOLN: 30.0000 mg | INTRAMUSCULAR | Status: AC

## 2023-02-18 MED ORDER — CHLORHEXIDINE GLUCONATE 0.12 % MT SOLN
OROMUCOSAL | Status: AC
  Administered 2023-02-18: 15 mL via OROMUCOSAL
  Filled 2023-02-18: qty 15

## 2023-02-18 MED ORDER — LEVONORGESTREL 20 MCG/DAY IU IUD
1.0000 | INTRAUTERINE_SYSTEM | INTRAUTERINE | Status: AC
  Administered 2023-02-18: 1 via INTRAUTERINE
  Filled 2023-02-18: qty 1

## 2023-02-18 MED ORDER — MIDAZOLAM HCL 2 MG/2ML IJ SOLN
INTRAMUSCULAR | Status: AC
  Filled 2023-02-18: qty 2

## 2023-02-18 MED ORDER — SCOPOLAMINE 1 MG/3DAYS TD PT72
1.0000 | MEDICATED_PATCH | Freq: Once | TRANSDERMAL | Status: DC
  Administered 2023-02-18: 1.5 mg via TRANSDERMAL
  Filled 2023-02-18: qty 1

## 2023-02-18 MED ORDER — FENTANYL CITRATE (PF) 250 MCG/5ML IJ SOLN
INTRAMUSCULAR | Status: DC | PRN
  Administered 2023-02-18: 100 ug via INTRAVENOUS
  Administered 2023-02-18: 50 ug via INTRAVENOUS

## 2023-02-18 MED ORDER — FENTANYL CITRATE (PF) 250 MCG/5ML IJ SOLN
INTRAMUSCULAR | Status: AC
  Filled 2023-02-18: qty 5

## 2023-02-18 MED ORDER — PROPOFOL 10 MG/ML IV BOLUS
INTRAVENOUS | Status: DC | PRN
  Administered 2023-02-18: 120 mg via INTRAVENOUS

## 2023-02-18 MED ORDER — ONDANSETRON HCL 4 MG/2ML IJ SOLN
INTRAMUSCULAR | Status: DC | PRN
  Administered 2023-02-18: 4 mg via INTRAVENOUS

## 2023-02-18 MED ORDER — ONDANSETRON 4 MG PO TBDP
4.0000 mg | ORAL_TABLET | Freq: Three times a day (TID) | ORAL | 0 refills | Status: DC | PRN
  Filled 2023-02-18: qty 20, 7d supply, fill #0

## 2023-02-18 MED ORDER — MIDAZOLAM HCL 2 MG/2ML IJ SOLN
INTRAMUSCULAR | Status: DC | PRN
  Administered 2023-02-18: 2 mg via INTRAVENOUS

## 2023-02-18 MED ORDER — DEXAMETHASONE SODIUM PHOSPHATE 10 MG/ML IJ SOLN
INTRAMUSCULAR | Status: DC | PRN
  Administered 2023-02-18: 10 mg via INTRAVENOUS

## 2023-02-18 MED ORDER — GLYCOPYRROLATE PF 0.2 MG/ML IJ SOSY
PREFILLED_SYRINGE | INTRAMUSCULAR | Status: DC | PRN
  Administered 2023-02-18 (×2): .2 mg via INTRAVENOUS

## 2023-02-18 MED ORDER — SUGAMMADEX SODIUM 200 MG/2ML IV SOLN
INTRAVENOUS | Status: DC | PRN
  Administered 2023-02-18: 200 mg via INTRAVENOUS

## 2023-02-18 MED ORDER — LIDOCAINE HCL (PF) 2 % IJ SOLN
INTRAMUSCULAR | Status: AC
  Filled 2023-02-18: qty 5

## 2023-02-18 MED ORDER — CHLORHEXIDINE GLUCONATE 0.12 % MT SOLN: 15.0000 mL | Freq: Once | OROMUCOSAL | Status: AC

## 2023-02-18 SURGICAL SUPPLY — 39 items
ADH SKN CLS APL DERMABOND .7 (GAUZE/BANDAGES/DRESSINGS) ×2
APL PRP STRL LF DISP 70% ISPRP (MISCELLANEOUS) ×2
BLADE SURG SZ11 CARB STEEL (BLADE) ×2 IMPLANT
CANNULA CAP OBTURATR AIRSEAL 8 (CAP) IMPLANT
CHLORAPREP W/TINT 26 (MISCELLANEOUS) ×2 IMPLANT
COVER MAYO STAND XLG (MISCELLANEOUS) ×2 IMPLANT
DERMABOND ADVANCED .7 DNX12 (GAUZE/BANDAGES/DRESSINGS) ×2 IMPLANT
DRAPE ARM DVNC X/XI (DISPOSABLE) ×6 IMPLANT
DRAPE COLUMN DVNC XI (DISPOSABLE) ×2 IMPLANT
ELECT REM PT RETURN 9FT ADLT (ELECTROSURGICAL) ×2
ELECTRODE REM PT RTRN 9FT ADLT (ELECTROSURGICAL) ×2 IMPLANT
FORCEPS PROGRASP DVNC XI (FORCEP) IMPLANT
GAUZE 4X4 16PLY ~~LOC~~+RFID DBL (SPONGE) ×4 IMPLANT
GLOVE BIO SURGEON STRL SZ 6.5 (GLOVE) ×6 IMPLANT
GLOVE BIOGEL PI IND STRL 7.0 (GLOVE) ×10 IMPLANT
GOWN STRL REUS W/ TWL LRG LVL3 (GOWN DISPOSABLE) ×4 IMPLANT
GOWN STRL REUS W/TWL LRG LVL3 (GOWN DISPOSABLE) ×6 IMPLANT
KIT PINK PAD W/HEAD ARE REST (MISCELLANEOUS) ×2
KIT PINK PAD W/HEAD ARM REST (MISCELLANEOUS) ×2 IMPLANT
KIT TURNOVER CYSTO (KITS) ×2 IMPLANT
MANIFOLD NEPTUNE II (INSTRUMENTS) ×2 IMPLANT
MIRENA IUD SYSTEM 52mg IMPLANT
NDL HYPO 21X1.5 SAFETY (NEEDLE) ×2 IMPLANT
NDL INSUFFLATION 14GA 120MM (NEEDLE) ×2 IMPLANT
NEEDLE HYPO 21X1.5 SAFETY (NEEDLE) ×2
NEEDLE INSUFFLATION 14GA 120MM (NEEDLE) ×2
OBTURATOR OPTICAL STND 8 DVNC (TROCAR) ×2
OBTURATOR OPTICALSTD 8 DVNC (TROCAR) ×2 IMPLANT
PACK PERI GYN (CUSTOM PROCEDURE TRAY) ×2 IMPLANT
SEAL UNIV 5-12 XI (MISCELLANEOUS) ×6 IMPLANT
SEALER VESSEL EXT DVNC XI (MISCELLANEOUS) ×2 IMPLANT
SET BASIN LINEN APH (SET/KITS/TRAYS/PACK) ×2 IMPLANT
SET TRI-LUMEN FLTR TB AIRSEAL (TUBING) IMPLANT
SOL ANTI FOG 6CC (MISCELLANEOUS) ×2 IMPLANT
SUT MNCRL AB 4-0 PS2 18 (SUTURE) ×2 IMPLANT
SYR 10ML LL (SYRINGE) ×2 IMPLANT
SYR CONTROL 10ML LL (SYRINGE) ×2 IMPLANT
TAPE TRANSPORE STRL 2 31045 (GAUZE/BANDAGES/DRESSINGS) ×2 IMPLANT
WATER STERILE IRR 500ML POUR (IV SOLUTION) ×2 IMPLANT

## 2023-02-18 NOTE — Anesthesia Procedure Notes (Signed)
Procedure Name: Intubation Date/Time: 02/18/2023 7:41 AM  Performed by: Izola Price., CRNAPre-anesthesia Checklist: Patient identified, Emergency Drugs available, Suction available and Patient being monitored Patient Re-evaluated:Patient Re-evaluated prior to induction Oxygen Delivery Method: Circle system utilized Preoxygenation: Pre-oxygenation with 100% oxygen Induction Type: IV induction Ventilation: Mask ventilation without difficulty Laryngoscope Size: Mac and 3 Grade View: Grade I Tube type: Oral Tube size: 7.0 mm Number of attempts: 1 Airway Equipment and Method: Stylet Placement Confirmation: ETT inserted through vocal cords under direct vision, positive ETCO2 and breath sounds checked- equal and bilateral Secured at: 22 cm Tube secured with: Tape Dental Injury: Teeth and Oropharynx as per pre-operative assessment

## 2023-02-18 NOTE — Telephone Encounter (Signed)
LVM and sent mychart msg asking pt to call back to schedule Dysport injection with Dr. Lucia Gaskins

## 2023-02-18 NOTE — Anesthesia Preprocedure Evaluation (Signed)
Anesthesia Evaluation  Patient identified by MRN, date of birth, ID band Patient awake    Reviewed: Allergy & Precautions, H&P , NPO status , Patient's Chart, lab work & pertinent test results  Airway Mallampati: I  TM Distance: >3 FB Neck ROM: Full    Dental no notable dental hx. (+) Dental Advisory Given, Teeth Intact   Pulmonary neg pulmonary ROS   Pulmonary exam normal breath sounds clear to auscultation       Cardiovascular hypertension, Normal cardiovascular exam Rhythm:Regular Rate:Normal     Neuro/Psych  PSYCHIATRIC DISORDERS Anxiety     negative neurological ROS     GI/Hepatic negative GI ROS, Neg liver ROS,,,  Endo/Other  negative endocrine ROS    Renal/GU negative Renal ROS  negative genitourinary   Musculoskeletal negative musculoskeletal ROS (+)    Abdominal   Peds negative pediatric ROS (+)  Hematology negative hematology ROS (+)   Anesthesia Other Findings   Reproductive/Obstetrics negative OB ROS                             Anesthesia Physical Anesthesia Plan  ASA: 1  Anesthesia Plan: General   Post-op Pain Management: Dilaudid IV   Induction: Intravenous  PONV Risk Score and Plan: 4 or greater and Ondansetron, Dexamethasone, Midazolam and Scopolamine patch - Pre-op  Airway Management Planned: Oral ETT  Additional Equipment:   Intra-op Plan:   Post-operative Plan: Extubation in OR  Informed Consent: I have reviewed the patients History and Physical, chart, labs and discussed the procedure including the risks, benefits and alternatives for the proposed anesthesia with the patient or authorized representative who has indicated his/her understanding and acceptance.     Dental advisory given  Plan Discussed with: CRNA and Surgeon  Anesthesia Plan Comments:        Anesthesia Quick Evaluation

## 2023-02-18 NOTE — Transfer of Care (Signed)
Immediate Anesthesia Transfer of Care Note  Patient: Beverly Gonzalez  Procedure(s) Performed: XI ROBOTIC ASSISTED SALPINGECTOMY (Bilateral: Abdomen) INSERTION OF IUD (Uterus)  Patient Location: PACU  Anesthesia Type:General  Level of Consciousness: drowsy  Airway & Oxygen Therapy: Patient Spontanous Breathing and Patient connected to face mask oxygen  Post-op Assessment: Report given to RN and Post -op Vital signs reviewed and stable  Post vital signs: Reviewed and stable  Last Vitals:  Vitals Value Taken Time  BP 96/57 02/18/23 0911  Temp    Pulse 65 02/18/23 0912  Resp 7 02/18/23 0912  SpO2 100 % 02/18/23 0912  Vitals shown include unfiled device data.  Last Pain:  Vitals:   02/18/23 0652  TempSrc: Oral  PainSc: 0-No pain         Complications: No notable events documented.

## 2023-02-18 NOTE — Anesthesia Postprocedure Evaluation (Signed)
Anesthesia Post Note  Patient: Beverly Gonzalez  Procedure(s) Performed: XI ROBOTIC ASSISTED SALPINGECTOMY (Bilateral: Abdomen) INSERTION OF IUD (Uterus)  Patient location during evaluation: Phase II Anesthesia Type: General Level of consciousness: awake and alert and oriented Pain management: pain level controlled Vital Signs Assessment: post-procedure vital signs reviewed and stable Respiratory status: spontaneous breathing, nonlabored ventilation and respiratory function stable Cardiovascular status: blood pressure returned to baseline and stable Postop Assessment: no apparent nausea or vomiting Anesthetic complications: no  No notable events documented.   Last Vitals:  Vitals:   02/18/23 1020 02/18/23 1029  BP: 111/75 111/75  Pulse: 69 69  Resp:    Temp: 36.4 C 36.4 C  SpO2:  98%    Last Pain:  Vitals:   02/18/23 1029  TempSrc: Axillary  PainSc: 3                  Beverly Gonzalez

## 2023-02-18 NOTE — Op Note (Signed)
PREOPERATIVE DIAGNOSIS:  1) Desires sterilization 2) Heavy menstrual bleeding POSTOPERATIVE DIAGNOSIS: same PROCEDURE PERFORMED: Robotic assisted- laparoscopic bilateral salpingectomy and Mirena IUD insertion SURGEON: Dr. Myna Hidalgo ANESTHESIA: General endotracheal.  ESTIMATED BLOOD LOSS: 20 cc.  IV FLUIDS: 1200 cc of crystalloid.  UOP: 400cc clear urine SPECIMEN(S): bilateral fallopian tubes COMPLICATIONS: None.  CONDITION: Stable.   FINDINGS: No ascites or peritoneal studding was appreciated.  Grossly normal appearing bowel.  Uterus normal size and shape.  Hyperemic appearance of posterior cul-de-sac noted.  Small bleb noted on posterior surface of uterus.  Ovaries and tubes were normal in appearance.  Images obtained (see media tab)   Informed consent was obtained from the patient prior to taking her to the operating room where anesthesia was found to be adequate. She was placed in dorsal lithotomy position and examined under anesthesia. She was prepped and draped in normal sterile fashion. The bladder was catheterized with a foley under sterile technique.  A bi-valve speculum was then placed, uterus sounded to 8cm.  The anterior lip of the cervix was grasped with the single tooth tenaculum. The hulka uterine manipulator was then advanced into the uterus to provide uterine mobility. The speculum and tenaculum were then removed.   Attention was then turned to the patients abdomen where a 8 mm infraumbilical skin incision was made with the scalpel. The veress needle was carefully introduced into the peritoneal cavity while tenting the abdominal wall. Intraperitoneal placement was confirmed by use of a saline-drop test.  The gas was connected and confirmed intrabdominal placement by a low initial pressure of . The abdomen was then insuflated with CO2 gas. The trocar and sleeve were then advanced without difficulty into the abdomen under direct visualization. Intraabdominal placement was  confirmed by the laparoscope and surveillance of the abdomen was performed. Grossly normal appearing abdomen as mentioned in findings above.  Two additional 5mm skin incision were made in the right and left lower quadrant with placement of the trocar under direct visualization. The patient was placed in Trendelenburg position and the [employer] robotic device was docked.  Next, attention was turned to the console where the salpingectomy was performed.      The left fallopian tube was grasped and using the vessel sealer was excised in its entirety. The tube was removed through the port without complications.  A similar procedure was performed on the right with removal of the fallopian tube. Excellent hemostasis was noted.  Under direct visualization TAP block was completed under direct visualization using 10cc of 0.25% marcaine in each of four locations. The instruments were then removed from the patients abdomen with air allowed to fully escape. Bleeding noted from LLQ port, hemostasis was obtained using the bovie.  All ports were closed with monocryl and dermabond.  The manipulator was removed from the cervix with no lacerations or bleeding identified.   The cervix was grasped with a single tooth tenaculum. The uterus was sounded to 8 cm.  Mirena  IUD placed per manufacturer's recommendations. The strings were trimmed to approximately 3 cm.  The patient tolerated the procedure well with all sponge, lap, and needle counts correct. The patient was taken to recovery in stable condition.  Myna Hidalgo, DO Attending Obstetrician & Gynecologist, [facility] for [employer], [facility]

## 2023-02-18 NOTE — Discharge Instructions (Signed)
HOME INSTRUCTIONS  Please note any unusual or excessive bleeding, pain, swelling. Mild dizziness or drowsiness are normal for about 24 hours after surgery.   Shower when comfortable  Restrictions: No driving for 24 hours or while taking pain medications.  Activity:  No heavy lifting (> 10 lbs) for two weeks.  Nothing in vagina (no tampons, douching, or intercourse) x 4 weeks; no tub baths for 4 weeks Vaginal spotting is expected but if your bleeding is heavy, period like,  please call the office   Diet:  You may return to your regular diet.  Do not eat large meals.  Eat small frequent meals throughout the day.  Continue to drink a good amount of water at least 6-8 glasses of water per day, hydration is very important for the healing process.  Pain Management: Take over the counter tylenol or ibuprofen as needed for pain.  You can either take one or alternate between the two medications for pain management.  You may also use a heating pack as needed.  For severe or breakthrough pain a prescription of oxycodone has been sent in.  This medication may cause constipation so be sure to take a stool softener twice daily while taking this medication.  Alcohol -- Avoid for 24 hours and while taking pain medications.  Nausea: Take sips of ginger ale or soda  Fever -- Call physician if temperature over 101 degrees  Follow up:  If you do not already have a follow up appointment scheduled, please call the office at [phone].  If you experience fever (a temperature greater than 100.4), pain unrelieved by pain medication, shortness of breath, swelling of a single leg, or any other symptoms which are concerning to you please the office immediately.

## 2023-02-20 ENCOUNTER — Encounter (HOSPITAL_COMMUNITY): Payer: Self-pay | Admitting: Obstetrics & Gynecology

## 2023-02-20 LAB — SURGICAL PATHOLOGY

## 2023-02-26 ENCOUNTER — Ambulatory Visit: Payer: 59 | Attending: Obstetrics & Gynecology | Admitting: Physical Therapy

## 2023-02-26 DIAGNOSIS — R279 Unspecified lack of coordination: Secondary | ICD-10-CM | POA: Diagnosis not present

## 2023-02-26 DIAGNOSIS — R293 Abnormal posture: Secondary | ICD-10-CM | POA: Insufficient documentation

## 2023-02-26 DIAGNOSIS — M6281 Muscle weakness (generalized): Secondary | ICD-10-CM | POA: Diagnosis not present

## 2023-02-26 NOTE — Therapy (Signed)
OUTPATIENT PHYSICAL THERAPY FEMALE PELVIC TREATMENT   Patient Name: Beverly Gonzalez MRN: [id number] DOB:December 15, 1985, 37 y.o., female Today's Date: 02/26/2023  END OF SESSION:  PT End of Session - 02/26/23 1406     Visit Number 2    Date for PT Re-Evaluation 06/16/23    Authorization Type [employer]    PT Start Time 1401    PT Stop Time 1441    PT Time Calculation (min) 40 min    Activity Tolerance Patient tolerated treatment well    Behavior During Therapy [facility] for tasks assessed/performed             Past Medical History:  Diagnosis Date   Anxiety    Severe preeclampsia, third trimester 11/18/2016   Vaginal Pap smear, abnormal    Past Surgical History:  Procedure Laterality Date   CESAREAN SECTION N/A 11/20/2016   Procedure: CESAREAN SECTION;  Surgeon: Myna Hidalgo, DO;  [facility]: [facility];  Service: Obstetrics;  Laterality: N/A;   COLONOSCOPY     DIAGNOSTIC LAPAROSCOPY WITH REMOVAL OF ECTOPIC PREGNANCY Left 04/02/2021   Procedure: DIAGNOSTIC LAPAROSCOPY, EVACUATION OF HEMOPERITONEUM;  Surgeon: Lazaro Arms, MD;  Location: [facility];  Service: Gynecology;  Laterality: Left;   INSERTION OF NON VAGINAL CONTRACEPTIVE DEVICE N/A 02/18/2023   Procedure: INSERTION OF IUD;  Surgeon: Myna Hidalgo, DO;  Location: [facility];  Service: Gynecology;  Laterality: N/A;   TONSILLECTOMY     WISDOM TOOTH EXTRACTION     XI ROBOTIC ASSISTED SALPINGECTOMY Bilateral 02/18/2023   Procedure: XI ROBOTIC ASSISTED SALPINGECTOMY;  Surgeon: Myna Hidalgo, DO;  Location: [facility];  Service: Gynecology;  Laterality: Bilateral;   Patient Active Problem List   Diagnosis Date Noted   Menorrhagia with irregular cycle 02/18/2023   Encounter for sterilization 02/18/2023   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   Normal intrauterine pregnancy in third trimester 04/08/2019   Generalized anxiety disorder 04/03/2018   History of poor pregnancy outcome 04/24/2017   HELLP (hemolytic anemia/elev liver enzymes/low  platelets in pregnancy) 11/18/2016    PCP: Anne Shutter, MD  REFERRING PROVIDER: Myna Hidalgo, DO   REFERRING DIAG: N39.3 (ICD-10-CM) - Stress incontinence  THERAPY DIAG:  Muscle weakness (generalized)  Abnormal posture  Unspecified lack of coordination  Rationale for Evaluation and Treatment: Rehabilitation  ONSET DATE: 1 year  SUBJECTIVE:                                                                                                                                                                                           SUBJECTIVE STATEMENT: Has had return of sharp pelvic pain since surgery 02/18/23. Pt reports clearance for  PT today under lifting restrictions. "Nothing heavy".    PAIN:  Are you having pain? Has had sharp severe pain at rectum since surgery.    PRECAUTIONS: None  RED FLAGS: None   WEIGHT BEARING RESTRICTIONS: No  FALLS:  Has patient fallen in last 6 months? No  LIVING ENVIRONMENT: Lives with: lives with their family Lives in: House/apartment   OCCUPATION: [employer]   PLOF: Independent  PATIENT GOALS: to have less leakage and prevent pain with upcoming surgery   PERTINENT HISTORY:  Anxiety, x2 pregnancy, vaginal birth, c-section Sexual abuse: No  BOWEL MOVEMENT: Pain with bowel movement: No Type of bowel movement:Type (Bristol Stool Scale) 4, Frequency regular , and Strain No Fully empty rectum: Yes:   Leakage: No Pads: No Fiber supplement: No  URINATION: Pain with urination: No Fully empty bladder: No Stream: Strong Urgency: No Frequency: not quicker than every 2 hours, 1 night Leakage:  jumping  - does HIIT a lot and only has leakage with jumping, needs to empty during workouts Pads: No  INTERCOURSE: Pain with intercourse:  not painful Ability to have vaginal penetration:  Yes:   Climax: not painful Marinoff Scale: 0/3  PREGNANCY: Vaginal deliveries 1 Tearing Yes: labial tear C-section  deliveries 1 Currently pregnant No  PROLAPSE: None   OBJECTIVE:   DIAGNOSTIC FINDINGS:    COGNITION: Overall cognitive status: Within functional limits for tasks assessed     SENSATION: Light touch: Appears intact Proprioception: Appears intact  MUSCLE LENGTH: Bil hamstrings and adductors limited by 25%   FUNCTIONAL TESTS:  Functional squat - able to complete fully, mild bil knee valgus  POSTURE: rounded shoulders and anterior pelvic tilt  PELVIC ALIGNMENT: WFL  LUMBARAROM/PROM:  A/PROM A/PROM  eval  Flexion WFL  Extension WFL  Right lateral flexion WFL  Left lateral flexion WFL  Right rotation Limited by 25%  Left rotation Limited by 25%   (Blank rows = not tested)  LOWER EXTREMITY ROM:  WFL  LOWER EXTREMITY MMT:  Bil hip abduction 3/5 all other hips 4/5; knees 5/5  PALPATION:   General  mild tightness in lumbar; no TTP                External Perineal Exam WFL no TTP                             Internal Pelvic Floor no TTP  Patient confirms identification and approves PT to assess internal pelvic floor and treatment Yes No emotional/communication barriers or cognitive limitation. Patient is motivated to learn. Patient understands and agrees with treatment goals and plan. PT explains patient will be examined in standing, sitting, and lying down to see how their muscles and joints work. When they are ready, they will be asked to remove their underwear so PT can examine their perineum. The patient is also given the option of providing their own chaperone as one is not provided in our facility. The patient also has the right and is explained the right to defer or refuse any part of the evaluation or treatment including the internal exam. With the patient's consent, PT will use one gloved finger to gently assess the muscles of the pelvic floor, seeing how well it contracts and relaxes and if there is muscle symmetry. After, the patient will get dressed and PT  and patient will discuss exam findings and plan of care. PT and patient discuss plan of care, schedule,  attendance policy and HEP activities.   PELVIC MMT:   MMT eval  Vaginal 0/5  Internal Anal Sphincter   External Anal Sphincter   Puborectalis   Diastasis Recti 1 finger separation throughout with full crunch, bulge noted. Cued for TA pre-contraction to crunch with no noted bulge   (Blank rows = not tested)        TONE: Slightly increased   PROLAPSE: Not seen in hooklying with cough   TODAY'S TREATMENT:                                                                                                                              DATE:   02/26/23: Manual - pt reporting having pain at rectum with tension since surgery. Similar to prior need of PFPT 4 years ago per pt. Pt consented to manual at gluteals and at external pelvic floor. Pt had tension throughout this region. Fascial release completed at rectal area externally over underwear at medial ischium in sidelying bil. Pt reports this is the area that has been so painful for her and reported feeling much less pain and less tension at end of session.  Pt cued throughout for diaphragmatic breathing and pelvic drops for decreased tension held at pelvic floor.     PATIENT EDUCATION:  Education details: Person educated: Patient Education method: Solicitor, Actor cues, Verbal cues, and Handouts Education comprehension: verbalized understanding and returned demonstration  HOME EXERCISE PROGRAM:  ASSESSMENT:  CLINICAL IMPRESSION: Patient presents for treatment, has had surgery and cleared for PT as long as she follows lifting restriction. Pt did not do any lifting during session and had gentle manual work completed within pt's tolerance at external pelvic floor at bill medial ischium and rectal/coccyx area. Pt confirmed this was her pain area and where she was feeling tension and sharp pain. Pt  reported feeling less tension and no pain at end of session. Pt would benefit from additional PT to further address deficits.    OBJECTIVE IMPAIRMENTS: decreased coordination, decreased endurance, decreased mobility, decreased strength, impaired flexibility, impaired tone, improper body mechanics, and postural dysfunction.   ACTIVITY LIMITATIONS: continence  PARTICIPATION LIMITATIONS:  jumping/workouts   PERSONAL FACTORS: 1 comorbidity: x2 pregnancies, 1 vaginal and 1 c-section  are also affecting patient's functional outcome.   REHAB POTENTIAL: Good  CLINICAL DECISION MAKING: Stable/uncomplicated  EVALUATION COMPLEXITY: Low   GOALS: Goals reviewed with patient? Yes  SHORT TERM GOALS: Target date: 03/14/23  Pt to be I with HEP.  Baseline: Goal status: INITIAL   LONG TERM GOALS: Target date: 012/30/24  Pt to be I with advanced HEP.  Baseline:  Goal status: INITIAL  2.  Pt to demonstrate at least 3/5 pelvic floor strength for improved pelvic stability and decreased strain at pelvic floor/ decrease leakage.  Baseline:  Goal status: INITIAL  3.  Pt to report no more than one instances of urinary leakage with jumping in the past month for  improved QOL. Baseline:  Goal status: INITIAL  4.  Pt to report no pain after abdominal surgery to return to activity once cleared by medical provider.  Baseline:  Goal status: INITIAL    PLAN:  PT FREQUENCY: 2x/week  PT DURATION: 6 weeks  PLANNED INTERVENTIONS: Therapeutic exercises, Therapeutic activity, Neuromuscular re-education, Patient/Family education, Self Care, Joint mobilization, Aquatic Therapy, Dry Needling, Spinal mobilization, Cryotherapy, Moist heat, scar mobilization, Taping, Biofeedback, Manual therapy, and Re-evaluation  PLAN FOR NEXT SESSION: internal as needed, biofeedback, pelvic floor coordination with breathing and without/with activity, quick paced exercises, single leg strengthening, core  strengthening   Otelia Sergeant, PT, DPT 09/11/243:00 PM

## 2023-02-28 ENCOUNTER — Encounter: Payer: Self-pay | Admitting: Obstetrics & Gynecology

## 2023-02-28 ENCOUNTER — Telehealth (INDEPENDENT_AMBULATORY_CARE_PROVIDER_SITE_OTHER): Payer: 59 | Admitting: Obstetrics & Gynecology

## 2023-02-28 DIAGNOSIS — Z4889 Encounter for other specified surgical aftercare: Secondary | ICD-10-CM

## 2023-02-28 NOTE — Progress Notes (Signed)
TELEHEALTH GYNECOLOGY VISIT ENCOUNTER NOTE  [facility]: [facility]   Patient location: Home  I connected with Beverly Gonzalez on 02/28/23 at 11:30 AM EDT by telephone and verified that I am speaking with the correct person using two identifiers. Patient was unable to do MyChart audiovisual encounter due to technical difficulties, she tried several times.    I discussed the limitations, risks, security and privacy concerns of performing an evaluation and management service by telephone and the availability of in person appointments. I also discussed with the patient that there may be a patient responsible charge related to this service. The patient expressed understanding and agreed to proceed.   History:  Beverly Gonzalez is a 37 y.o. [phone] female being evaluated today for postop follow up- s/p robotic salpingectomy and IUD insertion.  She denies fever/chills.  Pain improving.  Notes ecchymosis on LLQ, but slowly starting to improve.  No nausea/vomiting.  +flatus, +BM.  Tolerating regular diet.   Past Medical History:  Diagnosis Date   Anxiety    Severe preeclampsia, third trimester 11/18/2016   Vaginal Pap smear, abnormal    Past Surgical History:  Procedure Laterality Date   CESAREAN SECTION N/A 11/20/2016   Procedure: CESAREAN SECTION;  Surgeon: Myna Hidalgo, DO;  [facility];  Service: Obstetrics;  Laterality: N/A;   COLONOSCOPY     DIAGNOSTIC LAPAROSCOPY WITH REMOVAL OF ECTOPIC PREGNANCY Left 04/02/2021   Procedure: DIAGNOSTIC LAPAROSCOPY, EVACUATION OF HEMOPERITONEUM;  Surgeon: Lazaro Arms, MD;  Location: [facility];  Service: Gynecology;  Laterality: Left;   INSERTION OF NON VAGINAL CONTRACEPTIVE DEVICE N/A 02/18/2023   Procedure: INSERTION OF IUD;  Surgeon: Myna Hidalgo, DO;  Location: [facility];  Service: Gynecology;  Laterality: N/A;   TONSILLECTOMY     WISDOM TOOTH EXTRACTION     XI ROBOTIC ASSISTED SALPINGECTOMY  Bilateral 02/18/2023   Procedure: XI ROBOTIC ASSISTED SALPINGECTOMY;  Surgeon: Myna Hidalgo, DO;  Location: [facility];  Service: Gynecology;  Laterality: Bilateral;   The following portions of the patient's history were reviewed and updated as appropriate: allergies, current medications, past family history, past medical history, past social history, past surgical history and problem list.    Review of Systems:  Pertinent items noted in HPI and remainder of comprehensive ROS otherwise negative.  Physical Exam:   General:  Alert, oriented and cooperative.   Mental Status: Normal mood and affect perceived. Normal judgment and thought content.  Physical exam deferred due to nature of the encounter  Incisions: healing appropriately- C/D/I, ecchymosis noted LLQ  Labs and Imaging Results for orders placed or performed during the hospital encounter of 02/18/23 (from the past 336 hour(s))  Surgical pathology   Collection Time: 02/18/23  8:02 AM  Result Value Ref Range   SURGICAL PATHOLOGY      SURGICAL PATHOLOGY CASE: [id number] PATIENT: Beverly Gonzalez Surgical Pathology Report     Clinical History: Desires permanent sterilization Z30.09, heavy menstrual bleeding (las)     FINAL MICROSCOPIC DIAGNOSIS:  A.  RIGHT AND LEFT FALLOPIAN TUBE, BILATERAL SALPINGECTOMY: Benign fallopian tubes   GROSS DESCRIPTION:  Specimen is received in formalin and consists of 2 unoriented fimbriated fallopian tubes, measuring 9.7 and 10.5 cm in length and up to 0.8 cm in diameter.  Serosal surfaces are red-purple, smooth, and hyperemic. Sectioning reveals tan-red cut surfaces with pinpoint lumens. Representative sections are submitted in 2 cassettes. 1 = shorter tube 2 = longer tube Lovey Newcomer 02/18/2023)    Final  Diagnosis performed by Jerene Bears, MD.   Electronically signed 02/19/2023 Technical component performed at [facility], [address] [place], [place]  [address].  Professional component performed at [employer], [address] [place], [place] [address].  Immunohistochemistry Technical component (if applicable) was performed at [facility]. [address], [place], [place] [address].   IMMUNOHISTOCHEMISTRY DISCLAIMER (if applicable): Some of these immunohistochemical stains may have been developed and the performance characteristics determine by [facility]. Some may not have been cleared or approved by the U.S. Food and Drug Administration. The FDA has determined that such clearance or approval is not necessary. This test is used for clinical purposes. It should not be regarded as investigational or for research. This laboratory is certified under the Clinical Laboratory Improvement Amendments of 1988 (CLIA-88) as qualified to perform high complexity clinical laboratory testing.  The controls stained appropriately.   IHC stains are performed on formalin fixed, paraffin embedded tissue using a 3,3"diaminobenzidine (DAB) chromogen and [employer] Bond Autostainer System. The staining intensity of the nucleus is score manually and is reported as the percentage of tumor cell nuclei demonstrating specific nuclear staining. The specimens are fixed in 10% Neutral Formalin for at least 6 hours and up to 72hrs. These tests are validated on decalcified tissue. Results should be interpreted with caution given the possibility of false negative results on decalcified specimens. Antibody Clones are as follows ER-clone [id number], PR-clone 16, Ki67- clone [id number]. Some of these immunohistochemical stains may have been developed and the performance characteristics determined by [facility].    No results found.    Assessment and Plan:  Postop visit -meeting milestones appropriately -continue pelvic rest for another few weeks   I discussed the assessment and treatment plan with the patient. The  patient was provided an opportunity to ask questions and all were answered. The patient agreed with the plan and demonstrated an understanding of the instructions.   The patient was advised to call back or seek an in-person evaluation/go to the ED if the symptoms worsen or if the condition fails to improve as anticipated.  I provided 10 minutes of non-face-to-face time during this encounter, which including reviewing the chart, talking to patient and documentation.   Sharon Seller, DO [facility] for [employer], [facility]

## 2023-03-04 ENCOUNTER — Ambulatory Visit: Payer: 59 | Admitting: Physical Therapy

## 2023-03-04 DIAGNOSIS — M6281 Muscle weakness (generalized): Secondary | ICD-10-CM

## 2023-03-04 DIAGNOSIS — R279 Unspecified lack of coordination: Secondary | ICD-10-CM

## 2023-03-04 DIAGNOSIS — R293 Abnormal posture: Secondary | ICD-10-CM | POA: Diagnosis not present

## 2023-03-04 NOTE — Therapy (Addendum)
OUTPATIENT PHYSICAL THERAPY FEMALE PELVIC TREATMENT   Patient Name: Beverly Gonzalez MRN: [id number] DOB:August 20, 1985, 37 y.o., female Today's Date: 03/04/2023  END OF SESSION:  PT End of Session - 03/04/23 1534     Visit Number 3    Date for PT Re-Evaluation 06/16/23    Authorization Type [employer]    PT Start Time 1532    PT Stop Time 1610    PT Time Calculation (min) 38 min    Activity Tolerance Patient tolerated treatment well    Behavior During Therapy [facility] for tasks assessed/performed             Past Medical History:  Diagnosis Date   Anxiety    Severe preeclampsia, third trimester 11/18/2016   Vaginal Pap smear, abnormal    Past Surgical History:  Procedure Laterality Date   CESAREAN SECTION N/A 11/20/2016   Procedure: CESAREAN SECTION;  Surgeon: Myna Hidalgo, DO;  Location: [facility];  Service: Obstetrics;  Laterality: N/A;   COLONOSCOPY     DIAGNOSTIC LAPAROSCOPY WITH REMOVAL OF ECTOPIC PREGNANCY Left 04/02/2021   Procedure: DIAGNOSTIC LAPAROSCOPY, EVACUATION OF HEMOPERITONEUM;  Surgeon: Lazaro Arms, MD;  Location: [facility];  Service: Gynecology;  Laterality: Left;   INSERTION OF NON VAGINAL CONTRACEPTIVE DEVICE N/A 02/18/2023   Procedure: INSERTION OF IUD;  Surgeon: Myna Hidalgo, DO;  Location: [facility];  Service: Gynecology;  Laterality: N/A;   TONSILLECTOMY     WISDOM TOOTH EXTRACTION     XI ROBOTIC ASSISTED SALPINGECTOMY Bilateral 02/18/2023   Procedure: XI ROBOTIC ASSISTED SALPINGECTOMY;  Surgeon: Myna Hidalgo, DO;  Location: [facility];  Service: Gynecology;  Laterality: Bilateral;   Patient Active Problem List   Diagnosis Date Noted   Menorrhagia with irregular cycle 02/18/2023   Encounter for sterilization 02/18/2023   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   Normal intrauterine pregnancy in third trimester 04/08/2019   Generalized anxiety disorder 04/03/2018   History of poor pregnancy outcome 04/24/2017   HELLP (hemolytic anemia/elev liver enzymes/low  platelets in pregnancy) 11/18/2016    PCP: Anne Shutter, MD  REFERRING PROVIDER: Myna Hidalgo, DO   REFERRING DIAG: N39.3 (ICD-10-CM) - Stress incontinence  THERAPY DIAG:  Muscle weakness (generalized)  Abnormal posture  Unspecified lack of coordination  Rationale for Evaluation and Treatment: Rehabilitation  ONSET DATE: 1 year  SUBJECTIVE:                                                                                                                                                                                           SUBJECTIVE STATEMENT: 03/04/23 Pt reports that she had no pain after last session. The next day  after  that she was sore.  Pt reports that she has been doing more and she has had more cramping, returning to work tomorrow. Sx 2 weeks ago, she was cleared to have sex, she can only be on top, boyfriend can feel the strings sharp. Did not have IUD before. Had IUD placed. Hx of ectopic pregnancy when she got her IUD placed in the past.  She has been doing some purging and sliding things with her legs to donate, has been more sore. Boyfriend has been helpful.   Marland Kitchen    PAIN:  Are you having pain? Has had sharp severe pain at rectum since surgery.    PRECAUTIONS: None  RED FLAGS: None   WEIGHT BEARING RESTRICTIONS: No  FALLS:  Has patient fallen in last 6 months? No  LIVING ENVIRONMENT: Lives with: lives with their family Lives in: House/apartment   OCCUPATION: [employer] employee   PLOF: Independent  PATIENT GOALS: to have less leakage and prevent pain with upcoming surgery   PERTINENT HISTORY:  Anxiety, x2 pregnancy, vaginal birth, c-section Sexual abuse: No  BOWEL MOVEMENT: Pain with bowel movement: No Type of bowel movement:Type (Bristol Stool Scale) 4, Frequency regular , and Strain No Fully empty rectum: Yes:   Leakage: No Pads: No Fiber supplement: No  URINATION: Pain with urination: No Fully empty bladder:  No Stream: Strong Urgency: No Frequency: not quicker than every 2 hours, 1 night Leakage:  jumping  - does HIIT a lot and only has leakage with jumping, needs to empty during workouts Pads: No  INTERCOURSE: Pain with intercourse:  not painful Ability to have vaginal penetration:  Yes:   Climax: not painful Marinoff Scale: 0/3  PREGNANCY: Vaginal deliveries 1 Tearing Yes: labial tear C-section deliveries 1 Currently pregnant No  PROLAPSE: None   OBJECTIVE:   DIAGNOSTIC FINDINGS:  Tension coccygeal and lat to coccyx bilat.    COGNITION: Overall cognitive status: Within functional limits for tasks assessed     SENSATION: Light touch: Appears intact Proprioception: Appears intact  MUSCLE LENGTH: Bil hamstrings and adductors limited by 25%   FUNCTIONAL TESTS:  Functional squat - able to complete fully, mild bil knee valgus  POSTURE: rounded shoulders and anterior pelvic tilt  PELVIC ALIGNMENT: WFL  LUMBARAROM/PROM:  A/PROM A/PROM  eval  Flexion WFL  Extension WFL  Right lateral flexion WFL  Left lateral flexion WFL  Right rotation Limited by 25%  Left rotation Limited by 25%   (Blank rows = not tested)  LOWER EXTREMITY ROM:  WFL  LOWER EXTREMITY MMT:  Bil hip abduction 3/5 all other hips 4/5; knees 5/5  PALPATION:   General  mild tightness in lumbar; no TTP                External Perineal Exam WFL no TTP                             Internal Pelvic Floor no TTP  Patient confirms identification and approves PT to assess internal pelvic floor and treatment Yes No emotional/communication barriers or cognitive limitation. Patient is motivated to learn. Patient understands and agrees with treatment goals and plan. PT explains patient will be examined in standing, sitting, and lying down to see how their muscles and joints work. When they are ready, they will be asked to remove their underwear so PT can examine their perineum. The patient is also given  the option of providing  their own chaperone as one is not provided in our facility. The patient also has the right and is explained the right to defer or refuse any part of the evaluation or treatment including the internal exam. With the patient's consent, PT will use one gloved finger to gently assess the muscles of the pelvic floor, seeing how well it contracts and relaxes and if there is muscle symmetry. After, the patient will get dressed and PT and patient will discuss exam findings and plan of care. PT and patient discuss plan of care, schedule, attendance policy and HEP activities.   PELVIC MMT:   MMT eval  Vaginal 0/5  Internal Anal Sphincter   External Anal Sphincter   Puborectalis   Diastasis Recti 1 finger separation throughout with full crunch, bulge noted. Cued for TA pre-contraction to crunch with no noted bulge   (Blank rows = not tested)        TONE: Slightly increased   PROLAPSE: Not seen in hooklying with cough   TODAY'S TREATMENT:                                                                                                                              DATE:   03/04/23  Manual - pt reporting having pain at rectum with tension since surgery but improving. Pt consented to manual at gluteals and at external pelvic floor over clothing. Pt had tension throughout this region but improved since last visit. Fascial release completed at rectal area externally medial ischium in sidelying bil. Pt cued throughout for diaphragmatic breathing and pelvic drops for decreased tension held at pelvic floor. Pt reports this was sore area and had good release with manual but did still have a little tension.  Pt verbalized fear of something dropping as her boyfriend reports he can feel her strings from IUD during intercourse and requested internal to assess as she has been cleared for intercourse. Pt consented to this and no laxity seen during internal with coughing.   PATIENT EDUCATION:   Education details: Person educated: Patient Education method: Solicitor, Actor cues, Verbal cues, and Handouts Education comprehension: verbalized understanding and returned demonstration  HOME EXERCISE PROGRAM:  ASSESSMENT:  CLINICAL IMPRESSION: Patient presents for treatment, focus on relaxation techniques, manual at bil external pelvic floor, and assessment of prolapse without any signs found during this internal. Pt tolerated well and reports feeling better at end of session. Pt would benefit from additional PT to further address deficits.    OBJECTIVE IMPAIRMENTS: decreased coordination, decreased endurance, decreased mobility, decreased strength, impaired flexibility, impaired tone, improper body mechanics, and postural dysfunction.   ACTIVITY LIMITATIONS: continence  PARTICIPATION LIMITATIONS:  jumping/workouts   PERSONAL FACTORS: 1 comorbidity: x2 pregnancies, 1 vaginal and 1 c-section  are also affecting patient's functional outcome.   REHAB POTENTIAL: Good  CLINICAL DECISION MAKING: Stable/uncomplicated  EVALUATION COMPLEXITY: Low   GOALS: Goals reviewed with patient? Yes  SHORT TERM GOALS: Target  date: 03/14/23  Pt to be I with HEP.  Baseline: Goal status: INITIAL   LONG TERM GOALS: Target date: 012/30/24  Pt to be I with advanced HEP.  Baseline:  Goal status: INITIAL  2.  Pt to demonstrate at least 3/5 pelvic floor strength for improved pelvic stability and decreased strain at pelvic floor/ decrease leakage.  Baseline:  Goal status: INITIAL  3.  Pt to report no more than one instances of urinary leakage with jumping in the past month for improved QOL. Baseline:  Goal status: INITIAL  4.  Pt to report no pain after abdominal surgery to return to activity once cleared by medical provider.  Baseline:  Goal status: INITIAL    PLAN:  PT FREQUENCY: 2x/week  PT DURATION: 6 weeks  PLANNED INTERVENTIONS:  Therapeutic exercises, Therapeutic activity, Neuromuscular re-education, Patient/Family education, Self Care, Joint mobilization, Aquatic Therapy, Dry Needling, Spinal mobilization, Cryotherapy, Moist heat, scar mobilization, Taping, Biofeedback, Manual therapy, and Re-evaluation  PLAN FOR NEXT SESSION: internal as needed, biofeedback, pelvic floor coordination with breathing and without/with activity, quick paced exercises, single leg strengthening, core strengthening   Otelia Sergeant, PT, DPT 09/17/244:30 PM

## 2023-03-08 ENCOUNTER — Other Ambulatory Visit: Payer: Self-pay

## 2023-03-12 ENCOUNTER — Ambulatory Visit: Payer: 59 | Admitting: Physical Therapy

## 2023-03-12 DIAGNOSIS — R293 Abnormal posture: Secondary | ICD-10-CM

## 2023-03-12 DIAGNOSIS — M6281 Muscle weakness (generalized): Secondary | ICD-10-CM

## 2023-03-12 DIAGNOSIS — R279 Unspecified lack of coordination: Secondary | ICD-10-CM | POA: Diagnosis not present

## 2023-03-12 NOTE — Therapy (Signed)
OUTPATIENT PHYSICAL THERAPY FEMALE PELVIC TREATMENT   Patient Name: Beverly Gonzalez MRN: [id number] DOB:08-24-1985, 37 y.o., female Today's Date: 03/12/2023  END OF SESSION:  PT End of Session - 03/12/23 1100     Visit Number 4    Date for PT Re-Evaluation 06/16/23    Authorization Type [facility] [employer]    PT Start Time 1100    PT Stop Time 1140    PT Time Calculation (min) 40 min    Activity Tolerance Patient tolerated treatment well    Behavior During Therapy [facility] for tasks assessed/performed             Past Medical History:  Diagnosis Date   Anxiety    Severe preeclampsia, third trimester 11/18/2016   Vaginal Pap smear, abnormal    Past Surgical History:  Procedure Laterality Date   CESAREAN SECTION N/A 11/20/2016   Procedure: CESAREAN SECTION;  Surgeon: Myna Hidalgo, DO;  Location: [facility];  Service: Obstetrics;  Laterality: N/A;   COLONOSCOPY     DIAGNOSTIC LAPAROSCOPY WITH REMOVAL OF ECTOPIC PREGNANCY Left 04/02/2021   Procedure: DIAGNOSTIC LAPAROSCOPY, EVACUATION OF HEMOPERITONEUM;  Surgeon: Lazaro Arms, MD;  Location: [facility];  Service: Gynecology;  Laterality: Left;   INSERTION OF NON VAGINAL CONTRACEPTIVE DEVICE N/A 02/18/2023   Procedure: INSERTION OF IUD;  Surgeon: Myna Hidalgo, DO;  Location: [facility];  Service: Gynecology;  Laterality: N/A;   TONSILLECTOMY     WISDOM TOOTH EXTRACTION     XI ROBOTIC ASSISTED SALPINGECTOMY Bilateral 02/18/2023   Procedure: XI ROBOTIC ASSISTED SALPINGECTOMY;  Surgeon: Myna Hidalgo, DO;  Location: [facility];  Service: Gynecology;  Laterality: Bilateral;   Patient Active Problem List   Diagnosis Date Noted   Menorrhagia with irregular cycle 02/18/2023   Encounter for sterilization 02/18/2023   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   Normal intrauterine pregnancy in third trimester 04/08/2019   Generalized anxiety disorder 04/03/2018   History of poor pregnancy outcome 04/24/2017   HELLP (hemolytic anemia/elev liver enzymes/low  platelets in pregnancy) 11/18/2016    PCP: Anne Shutter, MD  REFERRING PROVIDER: Myna Hidalgo, DO   REFERRING DIAG: N39.3 (ICD-10-CM) - Stress incontinence  THERAPY DIAG:  Muscle weakness (generalized)  Abnormal posture  Rationale for Evaluation and Treatment: Rehabilitation  ONSET DATE: 1 year  SUBJECTIVE:                                                                                                                                                                                           SUBJECTIVE STATEMENT: Has returned to working out and returned to work. First few days at work had "excruciating pain" and needed  ball for tension release. Hasn't come back as bad but still painful.  No pain with intercourse. Is still having pain at lower abdomen with some swelling and tenderness  .    PAIN:  Are you having pain? Has had sharp severe pain at rectum since surgery.    PRECAUTIONS: None  RED FLAGS: None   WEIGHT BEARING RESTRICTIONS: No  FALLS:  Has patient fallen in last 6 months? No  LIVING ENVIRONMENT: Lives with: lives with their family Lives in: House/apartment   OCCUPATION: [employer] employee   PLOF: Independent  PATIENT GOALS: to have less leakage and prevent pain with upcoming surgery   PERTINENT HISTORY:  Anxiety, x2 pregnancy, vaginal birth, c-section Sexual abuse: No  BOWEL MOVEMENT: Pain with bowel movement: No Type of bowel movement:Type (Bristol Stool Scale) 4, Frequency regular , and Strain No Fully empty rectum: Yes:   Leakage: No Pads: No Fiber supplement: No  URINATION: Pain with urination: No Fully empty bladder: No Stream: Strong Urgency: No Frequency: not quicker than every 2 hours, 1 night Leakage:  jumping  - does HIIT a lot and only has leakage with jumping, needs to empty during workouts Pads: No  INTERCOURSE: Pain with intercourse:  not painful Ability to have vaginal penetration:  Yes:   Climax:  not painful Marinoff Scale: 0/3  PREGNANCY: Vaginal deliveries 1 Tearing Yes: labial tear C-section deliveries 1 Currently pregnant No  PROLAPSE: None   OBJECTIVE:   DIAGNOSTIC FINDINGS:  Tension coccygeal and lat to coccyx bilat.    COGNITION: Overall cognitive status: Within functional limits for tasks assessed     SENSATION: Light touch: Appears intact Proprioception: Appears intact  MUSCLE LENGTH: Bil hamstrings and adductors limited by 25%   FUNCTIONAL TESTS:  Functional squat - able to complete fully, mild bil knee valgus  POSTURE: rounded shoulders and anterior pelvic tilt  PELVIC ALIGNMENT: WFL  LUMBARAROM/PROM:  A/PROM A/PROM  eval  Flexion WFL  Extension WFL  Right lateral flexion WFL  Left lateral flexion WFL  Right rotation Limited by 25%  Left rotation Limited by 25%   (Blank rows = not tested)  LOWER EXTREMITY ROM:  WFL  LOWER EXTREMITY MMT:  Bil hip abduction 3/5 all other hips 4/5; knees 5/5  PALPATION:   General  mild tightness in lumbar; no TTP                External Perineal Exam WFL no TTP                             Internal Pelvic Floor no TTP  Patient confirms identification and approves PT to assess internal pelvic floor and treatment Yes No emotional/communication barriers or cognitive limitation. Patient is motivated to learn. Patient understands and agrees with treatment goals and plan. PT explains patient will be examined in standing, sitting, and lying down to see how their muscles and joints work. When they are ready, they will be asked to remove their underwear so PT can examine their perineum. The patient is also given the option of providing their own chaperone as one is not provided in our facility. The patient also has the right and is explained the right to defer or refuse any part of the evaluation or treatment including the internal exam. With the patient's consent, PT will use one gloved finger to gently assess  the muscles of the pelvic floor, seeing how well it contracts and relaxes  and if there is muscle symmetry. After, the patient will get dressed and PT and patient will discuss exam findings and plan of care. PT and patient discuss plan of care, schedule, attendance policy and HEP activities.   PELVIC MMT:   MMT eval  Vaginal 0/5  Internal Anal Sphincter   External Anal Sphincter   Puborectalis   Diastasis Recti 1 finger separation throughout with full crunch, bulge noted. Cued for TA pre-contraction to crunch with no noted bulge   (Blank rows = not tested)        TONE: Slightly increased   PROLAPSE: Not seen in hooklying with cough   TODAY'S TREATMENT:                                                                                                                              DATE:   03/04/23  Manual - pt reporting having pain at rectum with tension since surgery but improving. Pt consented to manual at gluteals and at external pelvic floor over clothing. Pt had tension throughout this region but improved since last visit. Fascial release completed at rectal area externally medial ischium in sidelying bil. Pt cued throughout for diaphragmatic breathing and pelvic drops for decreased tension held at pelvic floor. Pt reports this was sore area and had good release with manual but did still have a little tension.  Pt verbalized fear of something dropping as her boyfriend reports he can feel her strings from IUD during intercourse and requested internal to assess as she has been cleared for intercourse. Pt consented to this and no laxity seen during internal with coughing.   03/12/23  Manual - gentle fascial release at lower mid abdominal quadrants (not over scar sites) with minimal pressure. Good release noted with x3 small tension areas and pt reporting this is where she feels tightness and tenderness, indirect release technique completed and progressed to direct release technique with pt  tolerating well. Reported feeling better with this. Demonstrated improved ability to complete deep breathing more mobility in lower abdomen.  Manual - pt consented to manual at gluteals and at external pelvic floor over clothing. Pt had tension throughout this region but continued improvement since last visit. Fascial release completed at rectal area externally medial ischium in sidelying at Rt side. Pt cued throughout for diaphragmatic breathing and pelvic drops for decreased tension held at pelvic floor. Pt expressed feeling good release and does still have tightness especially around Rt ischial tuberosity but feeling better after manual work.   PATIENT EDUCATION:  Education details: Person educated: Patient Education method: Solicitor, Actor cues, Verbal cues, and Handouts Education comprehension: verbalized understanding and returned demonstration  HOME EXERCISE PROGRAM:  ASSESSMENT:  CLINICAL IMPRESSION: Patient presents for treatment, focus on relaxation techniques, manual at Rt external pelvic floor and lower abdomen (not over scar sites). Pt reports improvement with mobility and less pain but still having both, did demonstrate remaining tension but improvement  with manual today. Pt educated to continue to do stretching, breathing and stress management, icing as needed, and returning to exercise slowly/within tolerance without increase in symptoms and to follow all medical recommendations. Pt verbalized understanding. Pt tolerated well and reports feeling better at end of session. Pt would benefit from additional PT to further address deficits.    OBJECTIVE IMPAIRMENTS: decreased coordination, decreased endurance, decreased mobility, decreased strength, impaired flexibility, impaired tone, improper body mechanics, and postural dysfunction.   ACTIVITY LIMITATIONS: continence  PARTICIPATION LIMITATIONS:  jumping/workouts   PERSONAL FACTORS: 1  comorbidity: x2 pregnancies, 1 vaginal and 1 c-section  are also affecting patient's functional outcome.   REHAB POTENTIAL: Good  CLINICAL DECISION MAKING: Stable/uncomplicated  EVALUATION COMPLEXITY: Low   GOALS: Goals reviewed with patient? Yes  SHORT TERM GOALS: Target date: 03/14/23  Pt to be I with HEP.  Baseline: Goal status: INITIAL   LONG TERM GOALS: Target date: 012/30/24  Pt to be I with advanced HEP.  Baseline:  Goal status: INITIAL  2.  Pt to demonstrate at least 3/5 pelvic floor strength for improved pelvic stability and decreased strain at pelvic floor/ decrease leakage.  Baseline:  Goal status: INITIAL  3.  Pt to report no more than one instances of urinary leakage with jumping in the past month for improved QOL. Baseline:  Goal status: INITIAL  4.  Pt to report no pain after abdominal surgery to return to activity once cleared by medical provider.  Baseline:  Goal status: INITIAL    PLAN:  PT FREQUENCY: 2x/week  PT DURATION: 6 weeks  PLANNED INTERVENTIONS: Therapeutic exercises, Therapeutic activity, Neuromuscular re-education, Patient/Family education, Self Care, Joint mobilization, Aquatic Therapy, Dry Needling, Spinal mobilization, Cryotherapy, Moist heat, scar mobilization, Taping, Biofeedback, Manual therapy, and Re-evaluation  PLAN FOR NEXT SESSION: internal as needed, biofeedback, pelvic floor coordination with breathing and without/with activity, quick paced exercises, single leg strengthening, core strengthening   Otelia Sergeant, PT, DPT 03/12/2411:23 PM

## 2023-03-17 ENCOUNTER — Other Ambulatory Visit: Payer: Self-pay | Admitting: Internal Medicine

## 2023-03-17 ENCOUNTER — Other Ambulatory Visit (HOSPITAL_BASED_OUTPATIENT_CLINIC_OR_DEPARTMENT_OTHER): Payer: Self-pay

## 2023-03-17 DIAGNOSIS — F411 Generalized anxiety disorder: Secondary | ICD-10-CM

## 2023-03-17 MED ORDER — SERTRALINE HCL 50 MG PO TABS
75.0000 mg | ORAL_TABLET | Freq: Every day | ORAL | 1 refills | Status: DC
  Filled 2023-03-17: qty 135, 90d supply, fill #0
  Filled 2023-06-23: qty 135, 90d supply, fill #1
  Filled 2023-09-18: qty 135, 90d supply, fill #2

## 2023-03-18 ENCOUNTER — Encounter: Payer: Self-pay | Admitting: Physical Therapy

## 2023-03-21 ENCOUNTER — Encounter: Payer: Self-pay | Admitting: Obstetrics & Gynecology

## 2023-03-21 ENCOUNTER — Ambulatory Visit: Payer: 59 | Admitting: Obstetrics & Gynecology

## 2023-03-21 ENCOUNTER — Other Ambulatory Visit (HOSPITAL_COMMUNITY)
Admission: RE | Admit: 2023-03-21 | Discharge: 2023-03-21 | Disposition: A | Discharge status: Home / Self Care | Payer: 59 | Source: Ambulatory Visit | Attending: Obstetrics & Gynecology | Admitting: Obstetrics & Gynecology

## 2023-03-21 VITALS — BP 115/76 | HR 65 | Ht 70.0 in | Wt 156.0 lb

## 2023-03-21 DIAGNOSIS — Z85828 Personal history of other malignant neoplasm of skin: Secondary | ICD-10-CM | POA: Diagnosis not present

## 2023-03-21 DIAGNOSIS — Z30431 Encounter for routine checking of intrauterine contraceptive device: Secondary | ICD-10-CM | POA: Diagnosis not present

## 2023-03-21 DIAGNOSIS — Z4889 Encounter for other specified surgical aftercare: Secondary | ICD-10-CM

## 2023-03-21 DIAGNOSIS — Z124 Encounter for screening for malignant neoplasm of cervix: Secondary | ICD-10-CM

## 2023-03-21 DIAGNOSIS — C44619 Basal cell carcinoma of skin of left upper limb, including shoulder: Secondary | ICD-10-CM | POA: Diagnosis not present

## 2023-03-21 NOTE — Progress Notes (Signed)
GYN VISIT Patient name: Beverly Gonzalez MRN [id number]  Date of birth: 06-Jun-1986 Chief Complaint:   IUD check  History of Present Illness:   Beverly Gonzalez is a 37 y.o. [phone] female being seen today for IUD concerns.  4wk s/p robotic assisted laparoscopic salpingectomy and IUD insertion.  Partner reporting that he can feel them during IC.  Additionally she notes she has had her first period since surgery and it was heavier than normal.  Denies vaginal discharge, itching or irritation.  Denies significant pelvic pain.  She has noted some rectal pressure and has been seen by pelvic floor therapy.  LMP: this week   Review of Systems:   Pertinent items are noted in HPI Denies fever/chills, dizziness, headaches, visual disturbances, fatigue, shortness of breath, chest pain, abdominal pain, vomiting, no problems with bowel movements, urination, or intercourse unless otherwise stated above.  Pertinent History Reviewed:   Past Surgical History:  Procedure Laterality Date   CESAREAN SECTION N/A 11/20/2016   Procedure: CESAREAN SECTION;  Surgeon: Myna Hidalgo, DO;  Location: [facility];  Service: Obstetrics;  Laterality: N/A;   COLONOSCOPY     DIAGNOSTIC LAPAROSCOPY WITH REMOVAL OF ECTOPIC PREGNANCY Left 04/02/2021   Procedure: DIAGNOSTIC LAPAROSCOPY, EVACUATION OF HEMOPERITONEUM;  Surgeon: Lazaro Arms, MD;  Location: [facility];  Service: Gynecology;  Laterality: Left;   INSERTION OF NON VAGINAL CONTRACEPTIVE DEVICE N/A 02/18/2023   Procedure: INSERTION OF IUD;  Surgeon: Myna Hidalgo, DO;  Location: [facility];  Service: Gynecology;  Laterality: N/A;   TONSILLECTOMY     WISDOM TOOTH EXTRACTION     XI ROBOTIC ASSISTED SALPINGECTOMY Bilateral 02/18/2023   Procedure: XI ROBOTIC ASSISTED SALPINGECTOMY;  Surgeon: Myna Hidalgo, DO;  Location: [facility];  Service: Gynecology;  Laterality: Bilateral;    Past Medical History:  Diagnosis Date   Anxiety    Severe preeclampsia, third trimester  11/18/2016   Vaginal Pap smear, abnormal    Reviewed problem list, medications and allergies. Physical Assessment:   Vitals:   03/21/23 0818  BP: 115/76  Pulse: 65  Weight: 156 lb (70.8 kg)  Height: 5\' 10"  (1.778 m)  Body mass index is 22.38 kg/m.       Physical Examination:   General appearance: alert, well appearing, and in no distress  Psych: mood appropriate, normal affect  Skin: warm & dry   Cardiovascular: normal heart rate noted  Respiratory: normal respiratory effort, no distress  Abdomen: soft, non-tender   Pelvic: VULVA: normal appearing vulva with no masses, tenderness or lesions, VAGINA: normal appearing vagina with normal color and discharge, no lesions, CERVIX: normal appearing cervix without discharge or lesions, strings visualized at os- appear in proper position Pap/HPV collected today  Extremities: no edema   Chaperone:  pt declined     Assessment & Plan:  1) IUD check -strings in proper location and do not appear too long -should strings be trimmed concern for worsening discomfort with IC -suspect strings need more time to soften  2) Preventive screening -pap collected today, not sure of prior pap   Return in about 1 year (around 03/20/2024) for Annual.   Myna Hidalgo, DO Attending Obstetrician & Gynecologist, [facility] for [facility], [facility]

## 2023-03-22 ENCOUNTER — Other Ambulatory Visit (HOSPITAL_BASED_OUTPATIENT_CLINIC_OR_DEPARTMENT_OTHER): Payer: Self-pay

## 2023-03-25 ENCOUNTER — Encounter: Payer: Self-pay | Admitting: Obstetrics & Gynecology

## 2023-03-25 LAB — CYTOLOGY - PAP
Chlamydia: NEGATIVE
Comment: NEGATIVE
Comment: NEGATIVE
Comment: NEGATIVE
Comment: NORMAL
Diagnosis: NEGATIVE
High risk HPV: NEGATIVE
Neisseria Gonorrhea: NEGATIVE
Trichomonas: NEGATIVE

## 2023-03-27 ENCOUNTER — Ambulatory Visit: Payer: 59 | Attending: Obstetrics & Gynecology | Admitting: Physical Therapy

## 2023-03-27 DIAGNOSIS — R293 Abnormal posture: Secondary | ICD-10-CM | POA: Insufficient documentation

## 2023-03-27 DIAGNOSIS — R279 Unspecified lack of coordination: Secondary | ICD-10-CM | POA: Insufficient documentation

## 2023-03-27 DIAGNOSIS — M6281 Muscle weakness (generalized): Secondary | ICD-10-CM | POA: Insufficient documentation

## 2023-03-27 NOTE — Therapy (Signed)
OUTPATIENT PHYSICAL THERAPY FEMALE PELVIC TREATMENT   Patient Name: Beverly Gonzalez MRN: [id number] DOB:08/04/1985, 37 y.o., female Today's Date: 03/27/2023  END OF SESSION:  PT End of Session - 03/27/23 0933     Visit Number 4    Date for PT Re-Evaluation 06/16/23    Authorization Type [employer]    PT Start Time 0845    PT Stop Time 0925    PT Time Calculation (min) 40 min    Activity Tolerance Patient tolerated treatment well    Behavior During Therapy [facility] for tasks assessed/performed              Past Medical History:  Diagnosis Date   Anxiety    Severe preeclampsia, third trimester 11/18/2016   Vaginal Pap smear, abnormal    Past Surgical History:  Procedure Laterality Date   CESAREAN SECTION N/A 11/20/2016   Procedure: CESAREAN SECTION;  Surgeon: Myna Hidalgo, DO;  Location: [facility];  Service: Obstetrics;  Laterality: N/A;   COLONOSCOPY     DIAGNOSTIC LAPAROSCOPY WITH REMOVAL OF ECTOPIC PREGNANCY Left 04/02/2021   Procedure: DIAGNOSTIC LAPAROSCOPY, EVACUATION OF HEMOPERITONEUM;  Surgeon: Lazaro Arms, MD;  Location: [facility];  Service: Gynecology;  Laterality: Left;   INSERTION OF NON VAGINAL CONTRACEPTIVE DEVICE N/A 02/18/2023   Procedure: INSERTION OF IUD;  Surgeon: Myna Hidalgo, DO;  Location: [facility];  Service: Gynecology;  Laterality: N/A;   TONSILLECTOMY     WISDOM TOOTH EXTRACTION     XI ROBOTIC ASSISTED SALPINGECTOMY Bilateral 02/18/2023   Procedure: XI ROBOTIC ASSISTED SALPINGECTOMY;  Surgeon: Myna Hidalgo, DO;  Location: [facility];  Service: Gynecology;  Laterality: Bilateral;   Patient Active Problem List   Diagnosis Date Noted   Menorrhagia with irregular cycle 02/18/2023   Encounter for sterilization 02/18/2023   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   Normal intrauterine pregnancy in third trimester 04/08/2019   Generalized anxiety disorder 04/03/2018   History of poor pregnancy outcome 04/24/2017   HELLP (hemolytic anemia/elev liver  enzymes/low platelets in pregnancy) 11/18/2016    PCP: Anne Shutter, MD  REFERRING PROVIDER: Myna Hidalgo, DO   REFERRING DIAG: N39.3 (ICD-10-CM) - Stress incontinence  THERAPY DIAG:  Muscle weakness (generalized)  Abnormal posture  Unspecified lack of coordination  Rationale for Evaluation and Treatment: Rehabilitation  ONSET DATE: 1 year  SUBJECTIVE:                                                                                                                                                                                           SUBJECTIVE STATEMENT: No longer having pain, doing all stretches. Does feel like minor leakage with  jumping and laughing but does remain dry when checks.  Marland Kitchen    PAIN:  Are you having pain? Has had sharp severe pain at rectum since surgery.    PRECAUTIONS: None  RED FLAGS: None   WEIGHT BEARING RESTRICTIONS: No  FALLS:  Has patient fallen in last 6 months? No  LIVING ENVIRONMENT: Lives with: lives with their family Lives in: House/apartment   OCCUPATION: [employer]   PLOF: Independent  PATIENT GOALS: to have less leakage and prevent pain with upcoming surgery   PERTINENT HISTORY:  Anxiety, x2 pregnancy, vaginal birth, c-section Sexual abuse: No  BOWEL MOVEMENT: Pain with bowel movement: No Type of bowel movement:Type (Bristol Stool Scale) 4, Frequency regular , and Strain No Fully empty rectum: Yes:   Leakage: No Pads: No Fiber supplement: No  URINATION: Pain with urination: No Fully empty bladder: No Stream: Strong Urgency: No Frequency: not quicker than every 2 hours, 1 night Leakage:  jumping  - does HIIT a lot and only has leakage with jumping, needs to empty during workouts Pads: No  INTERCOURSE: Pain with intercourse:  not painful Ability to have vaginal penetration:  Yes:   Climax: not painful Marinoff Scale: 0/3  PREGNANCY: Vaginal deliveries 1 Tearing Yes: labial  tear C-section deliveries 1 Currently pregnant No  PROLAPSE: None   OBJECTIVE:   DIAGNOSTIC FINDINGS:  Tension coccygeal and lat to coccyx bilat.    COGNITION: Overall cognitive status: Within functional limits for tasks assessed     SENSATION: Light touch: Appears intact Proprioception: Appears intact  MUSCLE LENGTH: Bil hamstrings and adductors limited by 25%   FUNCTIONAL TESTS:  Functional squat - able to complete fully, mild bil knee valgus  POSTURE: rounded shoulders and anterior pelvic tilt  PELVIC ALIGNMENT: WFL  LUMBARAROM/PROM:  A/PROM A/PROM  eval  Flexion WFL  Extension WFL  Right lateral flexion WFL  Left lateral flexion WFL  Right rotation Limited by 25%  Left rotation Limited by 25%   (Blank rows = not tested)  LOWER EXTREMITY ROM:  WFL  LOWER EXTREMITY MMT:  Bil hip abduction 3/5 all other hips 4/5; knees 5/5  PALPATION:   General  mild tightness in lumbar; no TTP                External Perineal Exam WFL no TTP                             Internal Pelvic Floor no TTP  Patient confirms identification and approves PT to assess internal pelvic floor and treatment Yes No emotional/communication barriers or cognitive limitation. Patient is motivated to learn. Patient understands and agrees with treatment goals and plan. PT explains patient will be examined in standing, sitting, and lying down to see how their muscles and joints work. When they are ready, they will be asked to remove their underwear so PT can examine their perineum. The patient is also given the option of providing their own chaperone as one is not provided in our facility. The patient also has the right and is explained the right to defer or refuse any part of the evaluation or treatment including the internal exam. With the patient's consent, PT will use one gloved finger to gently assess the muscles of the pelvic floor, seeing how well it contracts and relaxes and if there is  muscle symmetry. After, the patient will get dressed and PT and patient will discuss exam findings  and plan of care. PT and patient discuss plan of care, schedule, attendance policy and HEP activities.   PELVIC MMT:   MMT eval  Vaginal 0/5  Internal Anal Sphincter   External Anal Sphincter   Puborectalis   Diastasis Recti 1 finger separation throughout with full crunch, bulge noted. Cued for TA pre-contraction to crunch with no noted bulge   (Blank rows = not tested)        TONE: Slightly increased   PROLAPSE: Not seen in hooklying with cough   TODAY'S TREATMENT:                                                                                                                              DATE:   03/04/23  Manual - pt reporting having pain at rectum with tension since surgery but improving. Pt consented to manual at gluteals and at external pelvic floor over clothing. Pt had tension throughout this region but improved since last visit. Fascial release completed at rectal area externally medial ischium in sidelying bil. Pt cued throughout for diaphragmatic breathing and pelvic drops for decreased tension held at pelvic floor. Pt reports this was sore area and had good release with manual but did still have a little tension.  Pt verbalized fear of something dropping as her boyfriend reports he can feel her strings from IUD during intercourse and requested internal to assess as she has been cleared for intercourse. Pt consented to this and no laxity seen during internal with coughing.   03/12/23  Manual - gentle fascial release at lower mid abdominal quadrants (not over scar sites) with minimal pressure. Good release noted with x3 small tension areas and pt reporting this is where she feels tightness and tenderness, indirect release technique completed and progressed to direct release technique with pt tolerating well. Reported feeling better with this. Demonstrated improved ability to complete  deep breathing more mobility in lower abdomen.  Manual - pt consented to manual at gluteals and at external pelvic floor over clothing. Pt had tension throughout this region but continued improvement since last visit. Fascial release completed at rectal area externally medial ischium in sidelying at Rt side. Pt cued throughout for diaphragmatic breathing and pelvic drops for decreased tension held at pelvic floor. Pt expressed feeling good release and does still have tightness especially around Rt ischial tuberosity but feeling better after manual work.   03/27/23:  Patient consented to internal pelvic floor treatment vaginally this date and found to have several trigger points bil more more tight at Rt side compared to Lt (though Lt still tight), pt tolerated gentle stretching and trigger point release manual work with fair release at Rt side and good release at [employer].  Lt side pubococcygeus, iliococcygeus, bil obturator internus muscles where manual completed today.  Pt also educated on pelvic wand for internal use vaginally to assist in muscle mobility and relaxation at home as well as pt asked how to complete  this more at home.   PATIENT EDUCATION:  Education details: Person educated: Patient Education method: Solicitor, Actor cues, Verbal cues, and Handouts Education comprehension: verbalized understanding and returned demonstration  HOME EXERCISE PROGRAM:  ASSESSMENT:  CLINICAL IMPRESSION: Patient presents for treatment, focus on internal vaginal treatment with cues for visualization of relaxation at pelvic floor, decreased abdominal and gluteal gripping and jaw clenching. Pt able to achieve this but with extra time and several attempts with cues. Pt tolerated internal manual work well does endorse this is similar pain to intercourse but doesn't always have pain it is positional. Pt tolerated well and reports feeling better at end of session. Pt would  benefit from additional PT to further address deficits.    OBJECTIVE IMPAIRMENTS: decreased coordination, decreased endurance, decreased mobility, decreased strength, impaired flexibility, impaired tone, improper body mechanics, and postural dysfunction.   ACTIVITY LIMITATIONS: continence  PARTICIPATION LIMITATIONS:  jumping/workouts   PERSONAL FACTORS: 1 comorbidity: x2 pregnancies, 1 vaginal and 1 c-section  are also affecting patient's functional outcome.   REHAB POTENTIAL: Good  CLINICAL DECISION MAKING: Stable/uncomplicated  EVALUATION COMPLEXITY: Low   GOALS: Goals reviewed with patient? Yes  SHORT TERM GOALS: Target date: 03/14/23  Pt to be I with HEP.  Baseline: Goal status: INITIAL   LONG TERM GOALS: Target date: 012/30/24  Pt to be I with advanced HEP.  Baseline:  Goal status: INITIAL  2.  Pt to demonstrate at least 3/5 pelvic floor strength for improved pelvic stability and decreased strain at pelvic floor/ decrease leakage.  Baseline:  Goal status: INITIAL  3.  Pt to report no more than one instances of urinary leakage with jumping in the past month for improved QOL. Baseline:  Goal status: INITIAL  4.  Pt to report no pain after abdominal surgery to return to activity once cleared by medical provider.  Baseline:  Goal status: INITIAL    PLAN:  PT FREQUENCY: 2x/week  PT DURATION: 6 weeks  PLANNED INTERVENTIONS: Therapeutic exercises, Therapeutic activity, Neuromuscular re-education, Patient/Family education, Self Care, Joint mobilization, Aquatic Therapy, Dry Needling, Spinal mobilization, Cryotherapy, Moist heat, scar mobilization, Taping, Biofeedback, Manual therapy, and Re-evaluation  PLAN FOR NEXT SESSION: internal as needed, biofeedback, pelvic floor coordination with breathing and without/with activity, quick paced exercises, single leg strengthening, core strengthening   Otelia Sergeant, PT, DPT 10/10/249:33 AM

## 2023-04-03 ENCOUNTER — Ambulatory Visit: Payer: 59 | Admitting: Physical Therapy

## 2023-04-03 DIAGNOSIS — M6281 Muscle weakness (generalized): Secondary | ICD-10-CM

## 2023-04-03 DIAGNOSIS — R279 Unspecified lack of coordination: Secondary | ICD-10-CM

## 2023-04-03 DIAGNOSIS — R293 Abnormal posture: Secondary | ICD-10-CM

## 2023-04-03 NOTE — Therapy (Signed)
OUTPATIENT PHYSICAL THERAPY FEMALE PELVIC TREATMENT   Patient Name: Beverly Gonzalez MRN: [id number] DOB:26-May-1986, 37 y.o., female Today's Date: 04/04/2023  END OF SESSION:  PT End of Session - 04/03/23 1619     Visit Number 6    Date for PT Re-Evaluation 06/16/23    Authorization Type [employer]    PT Start Time 1615    PT Stop Time 1654    PT Time Calculation (min) 39 min    Activity Tolerance Patient tolerated treatment well    Behavior During Therapy [facility] for tasks assessed/performed              Past Medical History:  Diagnosis Date   Anxiety    Severe preeclampsia, third trimester 11/18/2016   Vaginal Pap smear, abnormal    Past Surgical History:  Procedure Laterality Date   CESAREAN SECTION N/A 11/20/2016   Procedure: CESAREAN SECTION;  Surgeon: Myna Hidalgo, DO;  Location: [facility];  Service: Obstetrics;  Laterality: N/A;   COLONOSCOPY     DIAGNOSTIC LAPAROSCOPY WITH REMOVAL OF ECTOPIC PREGNANCY Left 04/02/2021   Procedure: DIAGNOSTIC LAPAROSCOPY, EVACUATION OF HEMOPERITONEUM;  Surgeon: Lazaro Arms, MD;  Location: [facility];  Service: Gynecology;  Laterality: Left;   INSERTION OF NON VAGINAL CONTRACEPTIVE DEVICE N/A 02/18/2023   Procedure: INSERTION OF IUD;  Surgeon: Myna Hidalgo, DO;  Location: [facility];  Service: Gynecology;  Laterality: N/A;   TONSILLECTOMY     WISDOM TOOTH EXTRACTION     XI ROBOTIC ASSISTED SALPINGECTOMY Bilateral 02/18/2023   Procedure: XI ROBOTIC ASSISTED SALPINGECTOMY;  Surgeon: Myna Hidalgo, DO;  Location: [facility];  Service: Gynecology;  Laterality: Bilateral;   Patient Active Problem List   Diagnosis Date Noted   Menorrhagia with irregular cycle 02/18/2023   Encounter for sterilization 02/18/2023   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   Normal intrauterine pregnancy in third trimester 04/08/2019   Generalized anxiety disorder 04/03/2018   History of poor pregnancy outcome 04/24/2017   HELLP (hemolytic anemia/elev liver  enzymes/low platelets in pregnancy) 11/18/2016    PCP: Anne Shutter, MD  REFERRING PROVIDER: Myna Hidalgo, DO   REFERRING DIAG: N39.3 (ICD-10-CM) - Stress incontinence  THERAPY DIAG:  Muscle weakness (generalized)  Abnormal posture  Unspecified lack of coordination  Rationale for Evaluation and Treatment: Rehabilitation  ONSET DATE: 1 year  SUBJECTIVE:                                                                                                                                                                                           SUBJECTIVE STATEMENT: Small hops causing urgency and small leakage.    PAIN:  Are  you having pain? Has had sharp severe pain at rectum since surgery.    PRECAUTIONS: None  RED FLAGS: None   WEIGHT BEARING RESTRICTIONS: No  FALLS:  Has patient fallen in last 6 months? No  LIVING ENVIRONMENT: Lives with: lives with their family Lives in: House/apartment   OCCUPATION: [employer] employee   PLOF: Independent  PATIENT GOALS: to have less leakage and prevent pain with upcoming surgery   PERTINENT HISTORY:  Anxiety, x2 pregnancy, vaginal birth, c-section Sexual abuse: No  BOWEL MOVEMENT: Pain with bowel movement: No Type of bowel movement:Type (Bristol Stool Scale) 4, Frequency regular , and Strain No Fully empty rectum: Yes:   Leakage: No Pads: No Fiber supplement: No  URINATION: Pain with urination: No Fully empty bladder: No Stream: Strong Urgency: No Frequency: not quicker than every 2 hours, 1 night Leakage:  jumping  - does HIIT a lot and only has leakage with jumping, needs to empty during workouts Pads: No  INTERCOURSE: Pain with intercourse:  not painful Ability to have vaginal penetration:  Yes:   Climax: not painful Marinoff Scale: 0/3  PREGNANCY: Vaginal deliveries 1 Tearing Yes: labial tear C-section deliveries 1 Currently pregnant No  PROLAPSE: None   OBJECTIVE:    DIAGNOSTIC FINDINGS:  Tension coccygeal and lat to coccyx bilat.    COGNITION: Overall cognitive status: Within functional limits for tasks assessed     SENSATION: Light touch: Appears intact Proprioception: Appears intact  MUSCLE LENGTH: Bil hamstrings and adductors limited by 25%   FUNCTIONAL TESTS:  Functional squat - able to complete fully, mild bil knee valgus  POSTURE: rounded shoulders and anterior pelvic tilt  PELVIC ALIGNMENT: WFL  LUMBARAROM/PROM:  A/PROM A/PROM  eval  Flexion WFL  Extension WFL  Right lateral flexion WFL  Left lateral flexion WFL  Right rotation Limited by 25%  Left rotation Limited by 25%   (Blank rows = not tested)  LOWER EXTREMITY ROM:  WFL  LOWER EXTREMITY MMT:  Bil hip abduction 3/5 all other hips 4/5; knees 5/5  PALPATION:   General  mild tightness in lumbar; no TTP                External Perineal Exam WFL no TTP                             Internal Pelvic Floor no TTP  Patient confirms identification and approves PT to assess internal pelvic floor and treatment Yes No emotional/communication barriers or cognitive limitation. Patient is motivated to learn. Patient understands and agrees with treatment goals and plan. PT explains patient will be examined in standing, sitting, and lying down to see how their muscles and joints work. When they are ready, they will be asked to remove their underwear so PT can examine their perineum. The patient is also given the option of providing their own chaperone as one is not provided in our facility. The patient also has the right and is explained the right to defer or refuse any part of the evaluation or treatment including the internal exam. With the patient's consent, PT will use one gloved finger to gently assess the muscles of the pelvic floor, seeing how well it contracts and relaxes and if there is muscle symmetry. After, the patient will get dressed and PT and patient will discuss  exam findings and plan of care. PT and patient discuss plan of care, schedule, attendance policy and HEP activities.  PELVIC MMT:   MMT eval  Vaginal 0/5  Internal Anal Sphincter   External Anal Sphincter   Puborectalis   Diastasis Recti 1 finger separation throughout with full crunch, bulge noted. Cued for TA pre-contraction to crunch with no noted bulge   (Blank rows = not tested)        TONE: Slightly increased   PROLAPSE: Not seen in hooklying with cough   TODAY'S TREATMENT:                                                                                                                              DATE:   03/12/23  Manual - gentle fascial release at lower mid abdominal quadrants (not over scar sites) with minimal pressure. Good release noted with x3 small tension areas and pt reporting this is where she feels tightness and tenderness, indirect release technique completed and progressed to direct release technique with pt tolerating well. Reported feeling better with this. Demonstrated improved ability to complete deep breathing more mobility in lower abdomen.  Manual - pt consented to manual at gluteals and at external pelvic floor over clothing. Pt had tension throughout this region but continued improvement since last visit. Fascial release completed at rectal area externally medial ischium in sidelying at Rt side. Pt cued throughout for diaphragmatic breathing and pelvic drops for decreased tension held at pelvic floor. Pt expressed feeling good release and does still have tightness especially around Rt ischial tuberosity but feeling better after manual work.   03/27/23:  Patient consented to internal pelvic floor treatment vaginally this date and found to have several trigger points bil more more tight at Rt side compared to Lt (though Lt still tight), pt tolerated gentle stretching and trigger point release manual work with fair release at Rt side and good release at [employer].  Lt side  pubococcygeus, iliococcygeus, bil obturator internus muscles where manual completed today.  Pt also educated on pelvic wand for internal use vaginally to assist in muscle mobility and relaxation at home as well as pt asked how to complete this more at home.  04/04/23: Pt concerns about minor leakage with jumping - attempted to recreate leakage stressor and pt demonstrated quick paced hops in forward and lateral direction only doing 2-3 either way and reports sensation of "If I do more I will have a little leakage", cued to attempt at slower pace, no sensation of leakage. Also attempted knack with hopping and pt reports she is unable to coordination this because of length of time to relax pelvic floor, attempted exhale with hops and this helped but resolved. Cued to attempt with more bounding technique instead of hoping and no leakage sensation. Pt educated to attempt this right now until pelvic floor mobility is more able to complete contractions and full relaxations. Pt agreed.  Pt educated on pelvic drops and use of towel roll for improved feeling of contraction/relaxation - 2x10 pelvic drops with diaphragmatic breathing (pt required  extra time for this and reports "it takes longer to let it go than it does to get the contraction")  2 mins static sitting on towel roll for stretching of pelvic floor, cues for diaphragmatic breathing and visualizations for pelvic relaxation Patient consented to internal pelvic floor treatment vaginally this date and found to have trigger points at Lt pubococcygeus, obturator internus and gentle manual completed for soft tissue release,trigger point release. Pt did report tension and discomfort with this but resolved and trigger points released well during session. No trigger points noted at Rt side. Pt demonstrated intermittent tightening of pelvic floor during manual but with extra time and cues able to relax more and pt states is now able to feel  the tightening and relaxing  which she couldn't before.   PATIENT EDUCATION:  Education details: Person educated: Patient Education method: Solicitor, Actor cues, Verbal cues, and Handouts Education comprehension: verbalized understanding and returned demonstration  HOME EXERCISE PROGRAM:  ASSESSMENT:  CLINICAL IMPRESSION: Patient presents for treatment, focus on pelvic floor mobility with improving pt's able to relax pelvic floor and decreased constant tension held for decreased pain, improved mobility and decreased leakage with jumping.  Pt would benefit from additional PT to further address deficits.    OBJECTIVE IMPAIRMENTS: decreased coordination, decreased endurance, decreased mobility, decreased strength, impaired flexibility, impaired tone, improper body mechanics, and postural dysfunction.   ACTIVITY LIMITATIONS: continence  PARTICIPATION LIMITATIONS:  jumping/workouts   PERSONAL FACTORS: 1 comorbidity: x2 pregnancies, 1 vaginal and 1 c-section  are also affecting patient's functional outcome.   REHAB POTENTIAL: Good  CLINICAL DECISION MAKING: Stable/uncomplicated  EVALUATION COMPLEXITY: Low   GOALS: Goals reviewed with patient? Yes  SHORT TERM GOALS: Target date: 03/14/23  Pt to be I with HEP.  Baseline: Goal status: MET   LONG TERM GOALS: Target date: 012/30/24  Pt to be I with advanced HEP.  Baseline:  Goal status: INITIAL  2.  Pt to demonstrate at least 3/5 pelvic floor strength for improved pelvic stability and decreased strain at pelvic floor/ decrease leakage.  Baseline:  Goal status: INITIAL  3.  Pt to report no more than one instances of urinary leakage with jumping in the past month for improved QOL. Baseline:  Goal status: INITIAL  4.  Pt to report no pain after abdominal surgery to return to activity once cleared by medical provider.  Baseline:  Goal status: MET    PLAN:  PT FREQUENCY: 2x/week  PT DURATION: 6  weeks  PLANNED INTERVENTIONS: Therapeutic exercises, Therapeutic activity, Neuromuscular re-education, Patient/Family education, Self Care, Joint mobilization, Aquatic Therapy, Dry Needling, Spinal mobilization, Cryotherapy, Moist heat, scar mobilization, Taping, Biofeedback, Manual therapy, and Re-evaluation  PLAN FOR NEXT SESSION: internal as needed, biofeedback, pelvic floor coordination with breathing and without/with activity, quick paced exercises, single leg strengthening, core strengthening   Otelia Sergeant, PT, DPT 04/03/2409:46 AM

## 2023-04-08 ENCOUNTER — Ambulatory Visit: Payer: 59 | Admitting: Physical Therapy

## 2023-04-08 DIAGNOSIS — R279 Unspecified lack of coordination: Secondary | ICD-10-CM | POA: Diagnosis not present

## 2023-04-08 DIAGNOSIS — M6281 Muscle weakness (generalized): Secondary | ICD-10-CM

## 2023-04-08 DIAGNOSIS — R293 Abnormal posture: Secondary | ICD-10-CM | POA: Diagnosis not present

## 2023-04-08 NOTE — Therapy (Addendum)
 OUTPATIENT PHYSICAL THERAPY FEMALE PELVIC TREATMENT   Patient Name: Beverly Gonzalez MRN: [id number] DOB:Nov 01, 1985, 37 y.o., female Today's Date: 04/08/2023  END OF SESSION:  PT End of Session - 04/08/23 1452     Visit Number 7    Date for PT Re-Evaluation 06/16/23    Authorization Type [facility] [employer]et[employer]    PT Start Time 1446    PT Stop Time 1529    PT Time Calculation (min) 43 min    Activity Tolerance Patient tolerated treatment well    Behavior During Therapy [facility] for tasks assessed/performed              Past Medical History:  Diagnosis Date   Anxiety    Severe preeclampsia, third trimester 11/18/2016   Vaginal Pap smear, abnormal    Past Surgical History:  Procedure Laterality Date   CESAREAN SECTION N/A 11/20/2016   Procedure: CESAREAN SECTION;  Surgeon: Myna Hidalgo, DO;  Location: [facility];  Service: Obstetrics;  Laterality: N/A;   COLONOSCOPY     DIAGNOSTIC LAPAROSCOPY WITH REMOVAL OF ECTOPIC PREGNANCY Left 04/02/2021   Procedure: DIAGNOSTIC LAPAROSCOPY, EVACUATION OF HEMOPERITONEUM;  Surgeon: Lazaro Arms, MD;  Location: [facility];  Service: Gynecology;  Laterality: Left;   INSERTION OF NON VAGINAL CONTRACEPTIVE DEVICE N/A 02/18/2023   Procedure: INSERTION OF IUD;  Surgeon: Myna Hidalgo, DO;  Location: [facility];  Service: Gynecology;  Laterality: N/A;   TONSILLECTOMY     WISDOM TOOTH EXTRACTION     XI ROBOTIC ASSISTED SALPINGECTOMY Bilateral 02/18/2023   Procedure: XI ROBOTIC ASSISTED SALPINGECTOMY;  Surgeon: Myna Hidalgo, DO;  Location: [facility];  Service: Gynecology;  Laterality: Bilateral;   Patient Active Problem List   Diagnosis Date Noted   Menorrhagia with irregular cycle 02/18/2023   Encounter for sterilization 02/18/2023   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   Normal intrauterine pregnancy in third trimester 04/08/2019   Generalized anxiety disorder 04/03/2018   History of poor pregnancy outcome 04/24/2017   HELLP (hemolytic anemia/elev liver  enzymes/low platelets in pregnancy) 11/18/2016    PCP: Anne Shutter, MD  REFERRING PROVIDER: Myna Hidalgo, DO   REFERRING DIAG: N39.3 (ICD-10-CM) - Stress incontinence  THERAPY DIAG:  Muscle weakness (generalized)  Unspecified lack of coordination  Rationale for Evaluation and Treatment: Rehabilitation  ONSET DATE: 1 year  SUBJECTIVE:                                                                                                                                                                                           SUBJECTIVE STATEMENT: Hasn't had pain, no leakage, has been working breathing and contractions.    PAIN:  Are you having pain? Has had sharp severe pain at rectum since surgery.    PRECAUTIONS: None  RED FLAGS: None   WEIGHT BEARING RESTRICTIONS: No  FALLS:  Has patient fallen in last 6 months? No  LIVING ENVIRONMENT: Lives with: lives with their family Lives in: House/apartment   OCCUPATION: [employer] employee   PLOF: Independent  PATIENT GOALS: to have less leakage and prevent pain with upcoming surgery   PERTINENT HISTORY:  Anxiety, x2 pregnancy, vaginal birth, c-section Sexual abuse: No  BOWEL MOVEMENT: Pain with bowel movement: No Type of bowel movement:Type (Bristol Stool Scale) 4, Frequency regular , and Strain No Fully empty rectum: Yes:   Leakage: No Pads: No Fiber supplement: No  URINATION: Pain with urination: No Fully empty bladder: No Stream: Strong Urgency: No Frequency: not quicker than every 2 hours, 1 night Leakage:  jumping  - does HIIT a lot and only has leakage with jumping, needs to empty during workouts Pads: No  INTERCOURSE: Pain with intercourse:  not painful Ability to have vaginal penetration:  Yes:   Climax: not painful Marinoff Scale: 0/3  PREGNANCY: Vaginal deliveries 1 Tearing Yes: labial tear C-section deliveries 1 Currently pregnant No  PROLAPSE: None   OBJECTIVE:    DIAGNOSTIC FINDINGS:  Tension coccygeal and lat to coccyx bilat.    COGNITION: Overall cognitive status: Within functional limits for tasks assessed     SENSATION: Light touch: Appears intact Proprioception: Appears intact  MUSCLE LENGTH: Bil hamstrings and adductors limited by 25%   FUNCTIONAL TESTS:  Functional squat - able to complete fully, mild bil knee valgus  POSTURE: rounded shoulders and anterior pelvic tilt  PELVIC ALIGNMENT: WFL  LUMBARAROM/PROM:  A/PROM A/PROM  eval  Flexion WFL  Extension WFL  Right lateral flexion WFL  Left lateral flexion WFL  Right rotation Limited by 25%  Left rotation Limited by 25%   (Blank rows = not tested)  LOWER EXTREMITY ROM:  WFL  LOWER EXTREMITY MMT:  Bil hip abduction 3/5 all other hips 4/5; knees 5/5  PALPATION:   General  mild tightness in lumbar; no TTP                External Perineal Exam WFL no TTP                             Internal Pelvic Floor no TTP  Patient confirms identification and approves PT to assess internal pelvic floor and treatment Yes No emotional/communication barriers or cognitive limitation. Patient is motivated to learn. Patient understands and agrees with treatment goals and plan. PT explains patient will be examined in standing, sitting, and lying down to see how their muscles and joints work. When they are ready, they will be asked to remove their underwear so PT can examine their perineum. The patient is also given the option of providing their own chaperone as one is not provided in our facility. The patient also has the right and is explained the right to defer or refuse any part of the evaluation or treatment including the internal exam. With the patient's consent, PT will use one gloved finger to gently assess the muscles of the pelvic floor, seeing how well it contracts and relaxes and if there is muscle symmetry. After, the patient will get dressed and PT and patient will discuss  exam findings and plan of care. PT and patient discuss plan of care, schedule, attendance policy and HEP  activities.   PELVIC MMT:   MMT eval  Vaginal 0/5  Internal Anal Sphincter   External Anal Sphincter   Puborectalis   Diastasis Recti 1 finger separation throughout with full crunch, bulge noted. Cued for TA pre-contraction to crunch with no noted bulge   (Blank rows = not tested)        TONE: Slightly increased   PROLAPSE: Not seen in hooklying with cough   TODAY'S TREATMENT:                                                                                                                              DATE:   03/27/23:  Patient consented to internal pelvic floor treatment vaginally this date and found to have several trigger points bil more more tight at Rt side compared to Lt (though Lt still tight), pt tolerated gentle stretching and trigger point release manual work with fair release at Rt side and good release at [employer].  Lt side pubococcygeus, iliococcygeus, bil obturator internus muscles where manual completed today.  Pt also educated on pelvic wand for internal use vaginally to assist in muscle mobility and relaxation at home as well as pt asked how to complete this more at home.  04/04/23: Pt concerns about minor leakage with jumping - attempted to recreate leakage stressor and pt demonstrated quick paced hops in forward and lateral direction only doing 2-3 either way and reports sensation of "If I do more I will have a little leakage", cued to attempt at slower pace, no sensation of leakage. Also attempted knack with hopping and pt reports she is unable to coordination this because of length of time to relax pelvic floor, attempted exhale with hops and this helped but resolved. Cued to attempt with more bounding technique instead of hoping and no leakage sensation. Pt educated to attempt this right now until pelvic floor mobility is more able to complete contractions and full  relaxations. Pt agreed.  Pt educated on pelvic drops and use of towel roll for improved feeling of contraction/relaxation - 2x10 pelvic drops with diaphragmatic breathing (pt required extra time for this and reports "it takes longer to let it go than it does to get the contraction")  2 mins static sitting on towel roll for stretching of pelvic floor, cues for diaphragmatic breathing and visualizations for pelvic relaxation Patient consented to internal pelvic floor treatment vaginally this date and found to have trigger points at Lt pubococcygeus, obturator internus and gentle manual completed for soft tissue release,trigger point release. Pt did report tension and discomfort with this but resolved and trigger points released well during session. No trigger points noted at Rt side. Pt demonstrated intermittent tightening of pelvic floor during manual but with extra time and cues able to relax more and pt states is now able to feel  the tightening and relaxing which she couldn't before.   04/08/23: Manual - fascial release at abdomen in all quadrants but more  TTP and tension noted at Lt side moderate release noted with indirect fascial release techniques within pt's tolerance. Diaphragmatic breathing cued throughout and for pelvic drops. Pt reports she can feel her tightening her pelvic floor and is more able to attempt to relax now with better understanding of this.   PATIENT EDUCATION:  Education details: Person educated: Patient Education method: Solicitor, Actor cues, Verbal cues, and Handouts Education comprehension: verbalized understanding and returned demonstration  HOME EXERCISE PROGRAM:  ASSESSMENT:  CLINICAL IMPRESSION: Patient presents for treatment, focus on abdominal mobility with  decreased tension for decreased pain and strain at pelvic floor.  Pt tolerated well but did have tenderness with palpation and great tightness but released moderately  with manual work. Pt would benefit from additional PT to further address deficits.    OBJECTIVE IMPAIRMENTS: decreased coordination, decreased endurance, decreased mobility, decreased strength, impaired flexibility, impaired tone, improper body mechanics, and postural dysfunction.   ACTIVITY LIMITATIONS: continence  PARTICIPATION LIMITATIONS:  jumping/workouts   PERSONAL FACTORS: 1 comorbidity: x2 pregnancies, 1 vaginal and 1 c-section  are also affecting patient's functional outcome.   REHAB POTENTIAL: Good  CLINICAL DECISION MAKING: Stable/uncomplicated  EVALUATION COMPLEXITY: Low   GOALS: Goals reviewed with patient? Yes  SHORT TERM GOALS: Target date: 03/14/23  Pt to be I with HEP.  Baseline: Goal status: MET   LONG TERM GOALS: Target date: 012/30/24  Pt to be I with advanced HEP.  Baseline:  Goal status: INITIAL  2.  Pt to demonstrate at least 3/5 pelvic floor strength for improved pelvic stability and decreased strain at pelvic floor/ decrease leakage.  Baseline:  Goal status: INITIAL  3.  Pt to report no more than one instances of urinary leakage with jumping in the past month for improved QOL. Baseline:  Goal status: INITIAL  4.  Pt to report no pain after abdominal surgery to return to activity once cleared by medical provider.  Baseline:  Goal status: MET    PLAN:  PT FREQUENCY: 2x/week  PT DURATION: 6 weeks  PLANNED INTERVENTIONS: Therapeutic exercises, Therapeutic activity, Neuromuscular re-education, Patient/Family education, Self Care, Joint mobilization, Aquatic Therapy, Dry Needling, Spinal mobilization, Cryotherapy, Moist heat, scar mobilization, Taping, Biofeedback, Manual therapy, and Re-evaluation  PLAN FOR NEXT SESSION: pelvic floor coordination with breathing and without/with activity, quick paced exercises, single leg strengthening, core strengthening   Otelia Sergeant, PT, DPT 10/22/244:01 PM  PHYSICAL THERAPY DISCHARGE  SUMMARY  Visits from Start of Care: 7  Current functional level related to goals / functional outcomes: Unable to reassess   Remaining deficits: Unable to reassess, pt not returning since last visit    Education / Equipment: HEP   Patient agrees to discharge. Patient goals were partially met. Patient is being discharged due to not returning since the last visit.  Otelia Sergeant, PT, DPT 09/16/2508:10 AM

## 2023-04-15 DIAGNOSIS — F4320 Adjustment disorder, unspecified: Secondary | ICD-10-CM | POA: Diagnosis not present

## 2023-04-25 ENCOUNTER — Other Ambulatory Visit (HOSPITAL_COMMUNITY): Payer: Self-pay

## 2023-04-28 ENCOUNTER — Ambulatory Visit: Payer: 59 | Admitting: Physical Therapy

## 2023-04-28 ENCOUNTER — Other Ambulatory Visit (HOSPITAL_COMMUNITY): Payer: Self-pay

## 2023-05-02 ENCOUNTER — Other Ambulatory Visit: Payer: Self-pay

## 2023-05-05 ENCOUNTER — Other Ambulatory Visit: Payer: Self-pay

## 2023-05-05 ENCOUNTER — Other Ambulatory Visit: Payer: Self-pay | Admitting: Neurology

## 2023-05-05 ENCOUNTER — Other Ambulatory Visit (HOSPITAL_COMMUNITY): Payer: Self-pay

## 2023-05-05 NOTE — Progress Notes (Signed)
Specialty Pharmacy Refill Coordination Note  Beverly Gonzalez is a 37 y.o. female contacted today regarding refills of specialty medication(s) Abobotulinumtoxina   Patient requested Delivery   Delivery date: 06/02/23   Verified address: [facility] [address]   Medication will be filled on 05/30/23.  Refill request is pending.

## 2023-05-06 ENCOUNTER — Other Ambulatory Visit: Payer: Self-pay

## 2023-05-08 ENCOUNTER — Other Ambulatory Visit: Payer: Self-pay

## 2023-05-08 ENCOUNTER — Other Ambulatory Visit (HOSPITAL_COMMUNITY): Payer: Self-pay

## 2023-05-08 MED ORDER — DYSPORT 500 UNITS IM SOLR
500.0000 [IU] | Freq: Once | INTRAMUSCULAR | 0 refills | Status: DC
  Filled 2023-05-09: qty 1, 1d supply, fill #0

## 2023-05-09 ENCOUNTER — Other Ambulatory Visit: Payer: Self-pay

## 2023-05-09 NOTE — Progress Notes (Signed)
Refill Request Completed.

## 2023-05-16 ENCOUNTER — Other Ambulatory Visit: Payer: Self-pay

## 2023-05-19 ENCOUNTER — Ambulatory Visit: Payer: 59 | Admitting: Physical Therapy

## 2023-05-30 ENCOUNTER — Other Ambulatory Visit (HOSPITAL_BASED_OUTPATIENT_CLINIC_OR_DEPARTMENT_OTHER): Payer: Self-pay

## 2023-05-30 DIAGNOSIS — D2371 Other benign neoplasm of skin of right lower limb, including hip: Secondary | ICD-10-CM | POA: Diagnosis not present

## 2023-05-30 DIAGNOSIS — Z85828 Personal history of other malignant neoplasm of skin: Secondary | ICD-10-CM | POA: Diagnosis not present

## 2023-05-30 DIAGNOSIS — D225 Melanocytic nevi of trunk: Secondary | ICD-10-CM | POA: Diagnosis not present

## 2023-05-30 DIAGNOSIS — D2271 Melanocytic nevi of right lower limb, including hip: Secondary | ICD-10-CM | POA: Diagnosis not present

## 2023-05-30 DIAGNOSIS — L905 Scar conditions and fibrosis of skin: Secondary | ICD-10-CM | POA: Diagnosis not present

## 2023-05-30 DIAGNOSIS — L718 Other rosacea: Secondary | ICD-10-CM | POA: Diagnosis not present

## 2023-05-30 DIAGNOSIS — D2262 Melanocytic nevi of left upper limb, including shoulder: Secondary | ICD-10-CM | POA: Diagnosis not present

## 2023-05-30 DIAGNOSIS — D2261 Melanocytic nevi of right upper limb, including shoulder: Secondary | ICD-10-CM | POA: Diagnosis not present

## 2023-05-30 DIAGNOSIS — D485 Neoplasm of uncertain behavior of skin: Secondary | ICD-10-CM | POA: Diagnosis not present

## 2023-05-30 DIAGNOSIS — D2272 Melanocytic nevi of left lower limb, including hip: Secondary | ICD-10-CM | POA: Diagnosis not present

## 2023-05-30 DIAGNOSIS — D224 Melanocytic nevi of scalp and neck: Secondary | ICD-10-CM | POA: Diagnosis not present

## 2023-05-30 MED ORDER — METRONIDAZOLE 0.75 % EX CREA
1.0000 | TOPICAL_CREAM | Freq: Two times a day (BID) | CUTANEOUS | 11 refills | Status: AC
  Filled 2023-05-30: qty 45, 30d supply, fill #0
  Filled 2023-09-18: qty 45, 30d supply, fill #1
  Filled 2024-03-15: qty 45, 30d supply, fill #2

## 2023-06-02 ENCOUNTER — Telehealth: Payer: Self-pay | Admitting: Neurology

## 2023-06-02 ENCOUNTER — Other Ambulatory Visit (HOSPITAL_BASED_OUTPATIENT_CLINIC_OR_DEPARTMENT_OTHER): Payer: Self-pay

## 2023-06-02 NOTE — Telephone Encounter (Signed)
We received (1) 500 unit Dysport from [facility]

## 2023-06-24 ENCOUNTER — Other Ambulatory Visit: Payer: Self-pay

## 2023-07-15 IMAGING — US US OB < 14 WEEKS - US OB TV
1 series · 15 of 28 positions shown · non-contrast
Comparison: None.

CLINICAL DATA: Pregnant, vaginal bleeding, IUD.  Beta HCG 790.

EXAM:
OBSTETRIC <14 WK US AND TRANSVAGINAL OB US
TECHNIQUE: Both transabdominal and transvaginal ultrasound examinations were
performed for complete evaluation of the gestation as well as the
maternal uterus, adnexal regions, and pelvic cul-de-sac.
Transvaginal technique was performed to assess early pregnancy.

[Series 1: us ob < 14 weeks - us ob tv · 56 acquisitions, 15 frames shown]
[im 1/56]
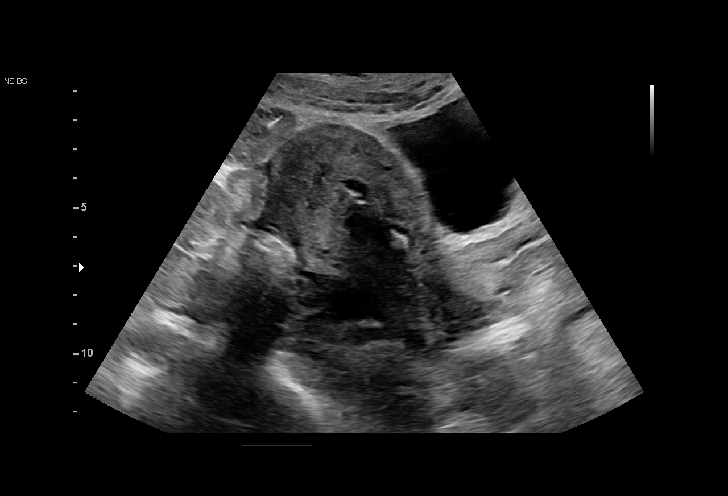
[im 5/56]
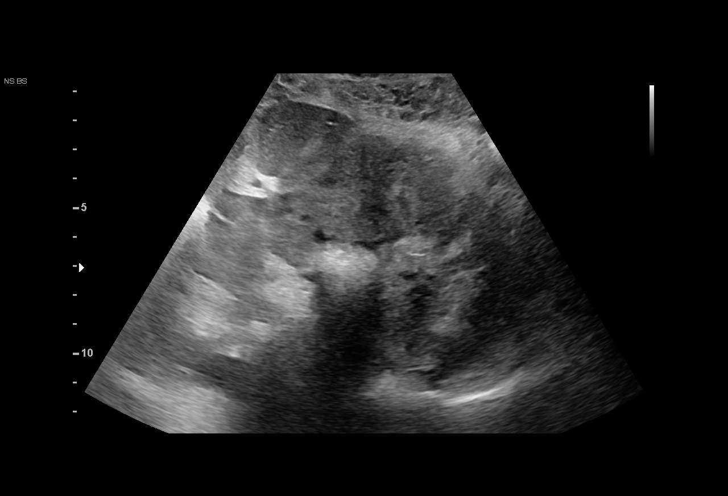
[im 9/56]
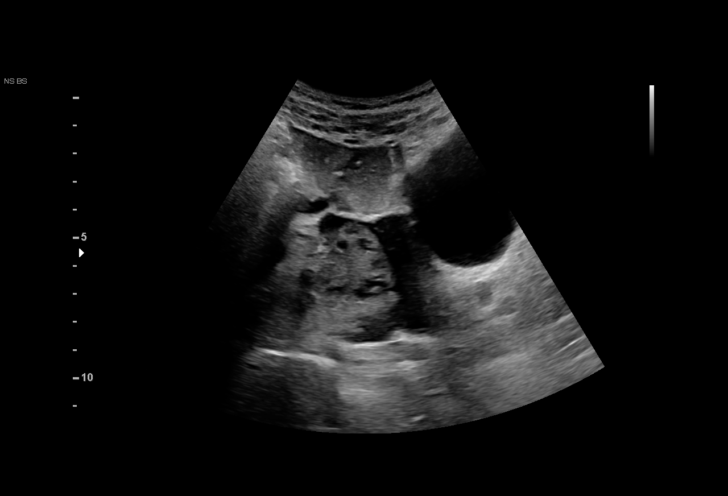
[im 13/56]
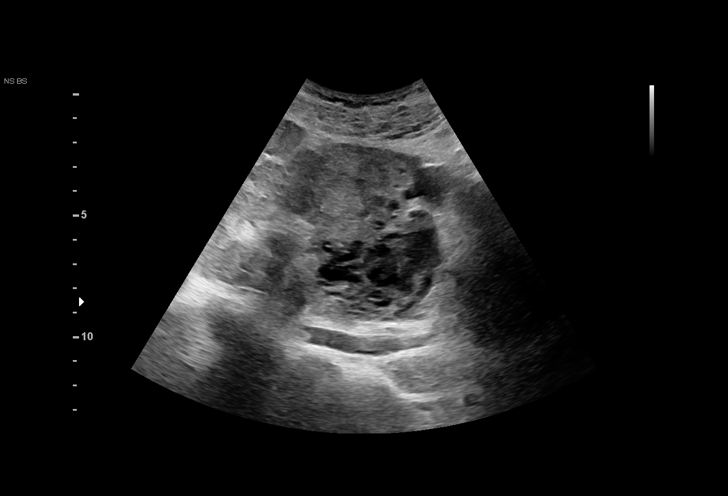
[im 17/56]
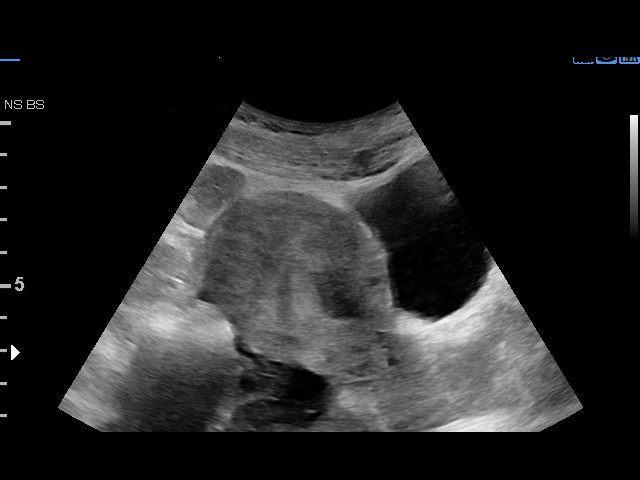
[im 21/56]
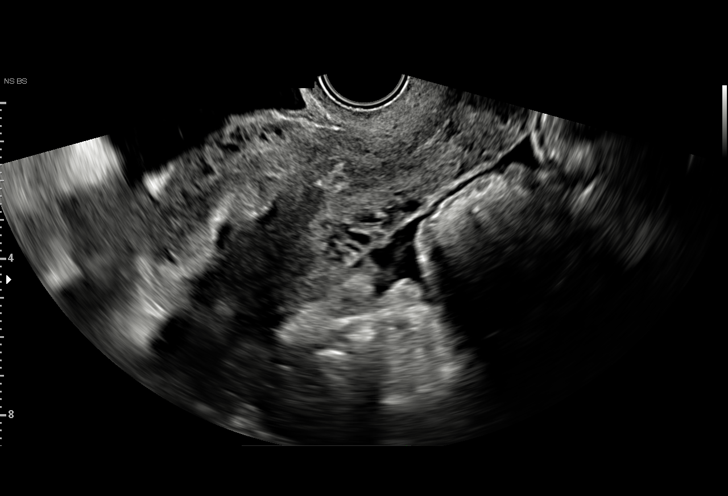
[im 25/56]
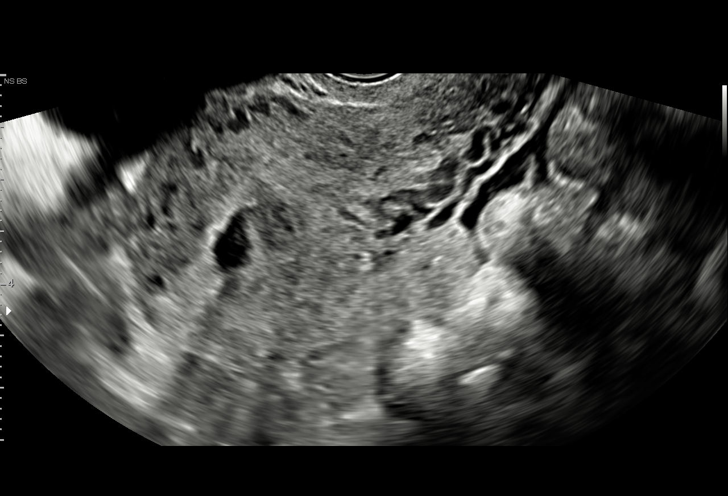
[im 29/56]
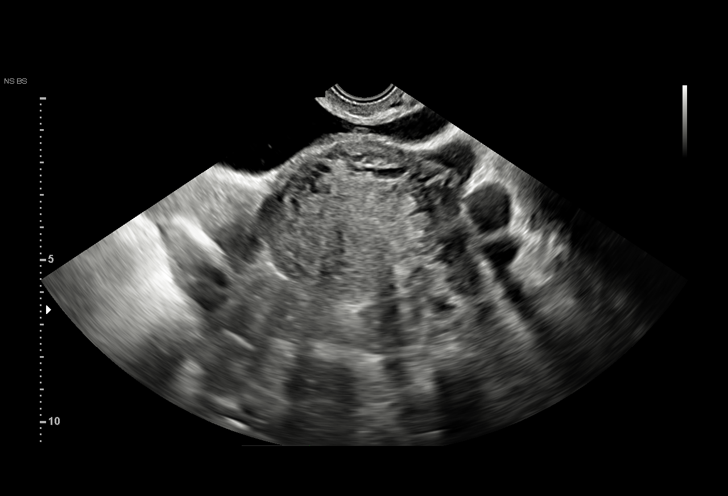
[im 31/56]
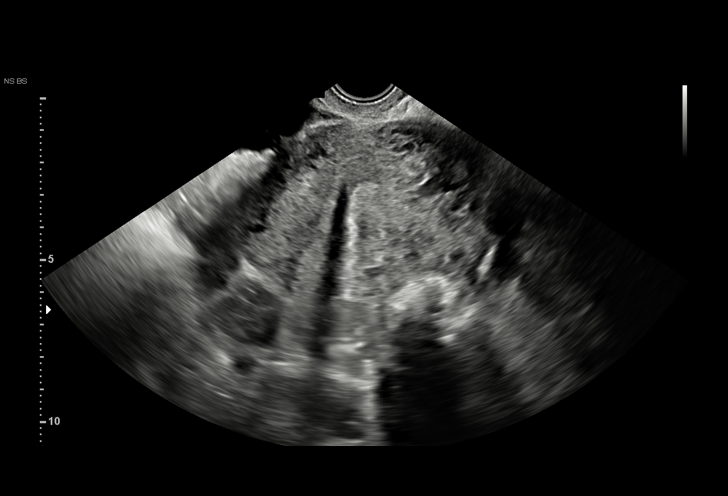
[im 35/56]
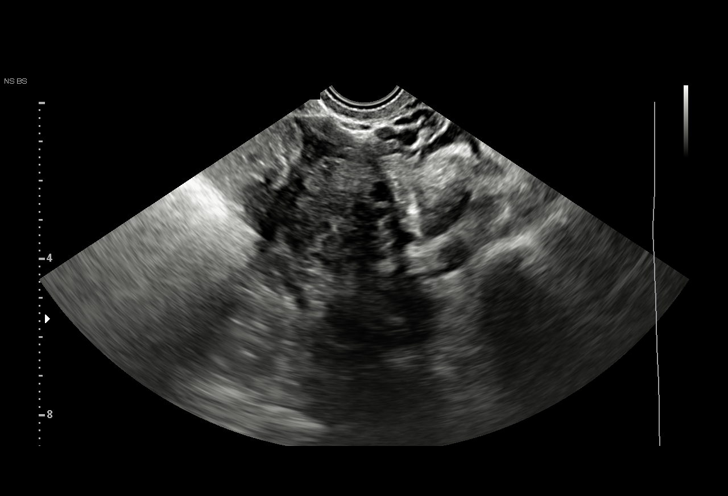
[im 39/56]
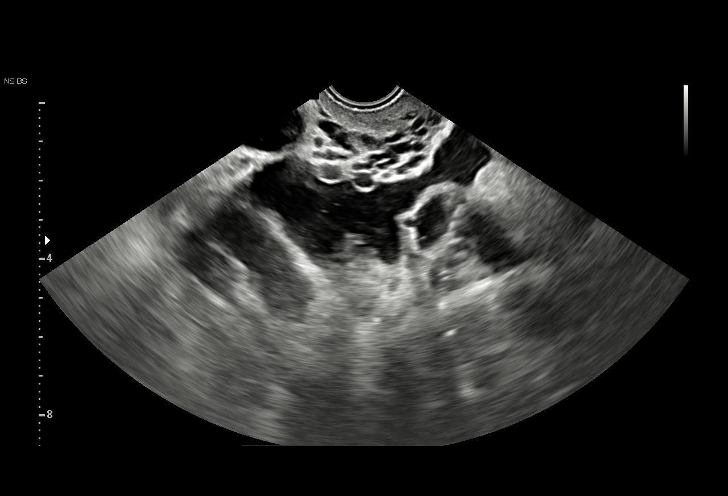
[im 43/56]
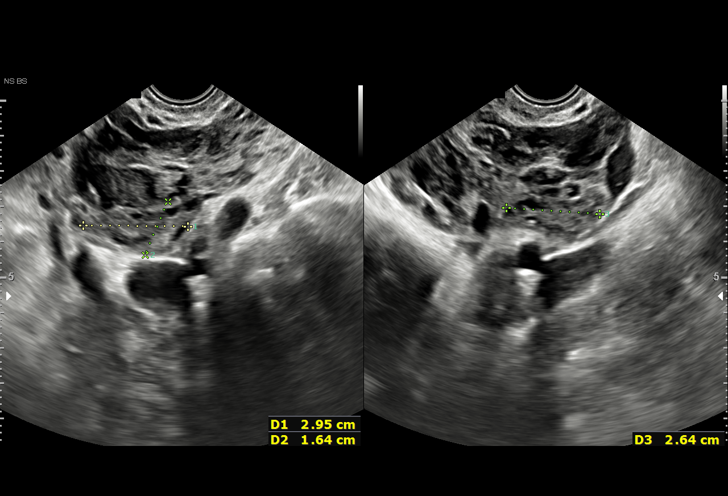
[im 47/56]
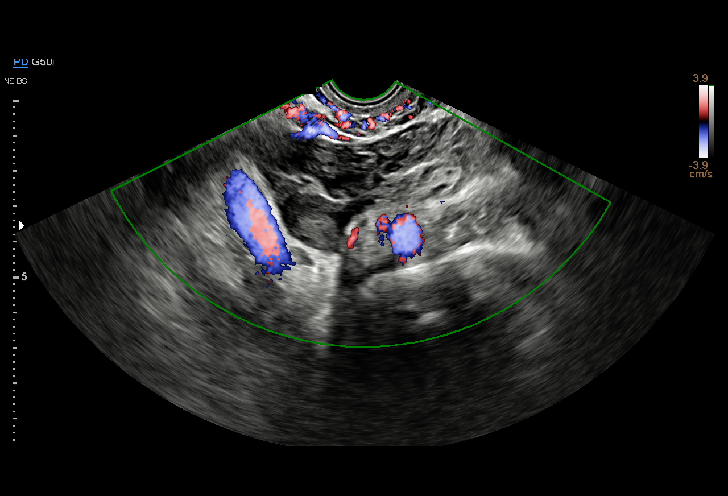
[im 51/56]
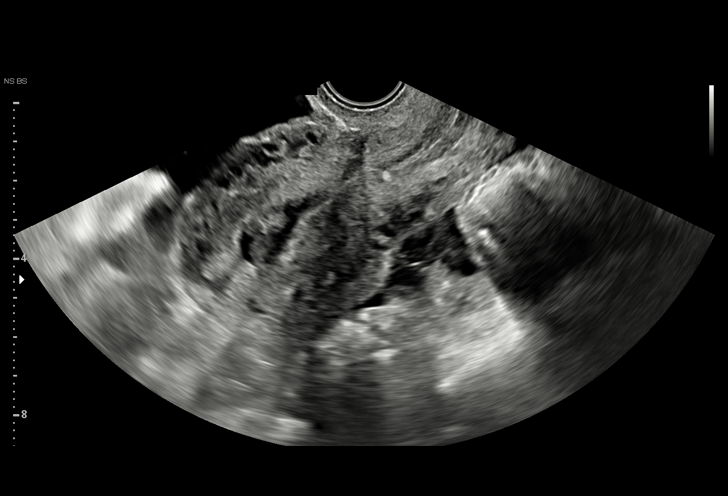
[im 56/56]
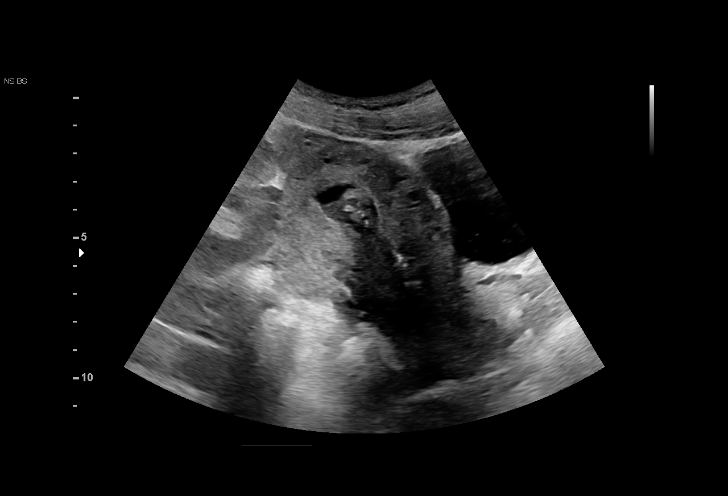

[15 of 28 positions shown; findings below may reference images not displayed]

FINDINGS: Intrauterine gestational sac: None

Maternal uterus/adnexae: IUD in satisfactory position. Mild fluid in
the endometrial fundus, mobile, not favoring a true gestational sac.

Right ovary is within normal limits, measuring 3.2 x 3.6 x 1.9 cm.

Left ovary measures 3.0 x 1.6 x 2.6 cm. Adjacent hemorrhage/debris
is suspected in the left adnexa. In the setting of a positive
pregnancy test, this suggests sentinel clot related to nonvisualized
ectopic pregnancy within the left fallopian tube.

Moderate pelvic ascites.
IMPRESSION: No IUP is visualized.  IUD in satisfactory position.

Hemorrhage/debris in the left adnexa, adjacent to the left ovary,
suspicious for sentinel clot related to nonvisualized ectopic
pregnancy within the left fallopian tube.

Critical Value/emergent results were called by telephone at the time
of interpretation on 04/02/2021 at [DATE] to provider ESMIE
CERVANTEZ , who verbally acknowledged these results.

## 2023-07-25 ENCOUNTER — Ambulatory Visit (INDEPENDENT_AMBULATORY_CARE_PROVIDER_SITE_OTHER): Payer: Self-pay | Admitting: Neurology

## 2023-07-25 DIAGNOSIS — G244 Idiopathic orofacial dystonia: Secondary | ICD-10-CM

## 2023-07-27 NOTE — Progress Notes (Signed)
 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  Symptoms started over a year ago with pain in the jaw muscles, limited mouth opening to where she had significantly decreased intake and weight loss, also dysarthria and temporomandibular joint problems due to the symptoms.  They were impairing her daily activities with social embarrassment, inability to work, weight loss and forming a significant impact on the overall quality of life of the patient.  Inability to open her jaw limits the maximal mouth opening and hampers speech, mastication/eating.  Medications tried: Patient's been under the care of neurology, primary care, she has tried physical therapy, massage, chiropractic, analgesics, muscle relaxer such as Flexeril and baclofen which gave her significant side effects such as sedation.  These were tried over a 29-month period without reduction of pain or quality of life or range of motion.  Response: She is much improved, feels functionality is better, decreased pain, increased range of motion of jaw, can eat without pain, can open her mouth/jaw wide.  Side effects none   reviewed w/ pt the procedure of botulinum toxin, incl side effects - localized weakness, inj site rxn, myalgia and spread from site of injection   Procedure note   EMG: EMG guidance was not used today to inject muscles detailed below but may be used in the future. Aseptic procedure was performed and patient tolerated procedure. Procedure was performed by Dr. DELENA Epp   A 31 gauge needle was used to inject  Patient tolerated the procedure.    Refractory to oral medications  Adjusted dose today to optimize response    Motrin  / tylenol  for injections site pain / soreness   REMS precautions handout given to patient   RTC - see instructions for details   Dysport  500Units [address]-6 250U bilaterally was placed in 5 locations in each masseter bilaterally.  Units Injected 500 , Units wasted 0 , Units billed 500   j code [id number]  Facial injections [id number]  Onetha Epp, MD  [facility]  [address]  [place], [place] [address]  Phone [phone] Fax [phone]

## 2023-08-05 DIAGNOSIS — R41840 Attention and concentration deficit: Secondary | ICD-10-CM | POA: Diagnosis not present

## 2023-08-12 ENCOUNTER — Other Ambulatory Visit (HOSPITAL_BASED_OUTPATIENT_CLINIC_OR_DEPARTMENT_OTHER): Payer: Self-pay

## 2023-08-12 DIAGNOSIS — R41840 Attention and concentration deficit: Secondary | ICD-10-CM | POA: Diagnosis not present

## 2023-08-12 DIAGNOSIS — F419 Anxiety disorder, unspecified: Secondary | ICD-10-CM | POA: Diagnosis not present

## 2023-08-12 MED ORDER — LISDEXAMFETAMINE DIMESYLATE 20 MG PO CAPS
20.0000 mg | ORAL_CAPSULE | Freq: Every day | ORAL | 0 refills | Status: AC
  Filled 2023-08-12: qty 30, 30d supply, fill #0

## 2023-08-14 ENCOUNTER — Other Ambulatory Visit (HOSPITAL_COMMUNITY): Payer: Self-pay

## 2023-08-15 IMAGING — US US ART/VEN ABD/PELV/SCROTUM DOPPLER LTD
1 series · 16 of 25 positions shown · non-contrast
Comparison: Transvaginal ultrasound from earlier in the same day.

CLINICAL DATA: Left-sided pelvic pain, evaluate for possible
torsion

EXAM:
DOPPLER ULTRASOUND OF OVARIES
TECHNIQUE: Color and duplex Doppler ultrasound was utilized to evaluate blood
flow to the ovaries.

[Series 1: us art/ven abd/pelv/scrotum doppler ltd · 53 acquisitions, 16 frames shown]
[im 1/53]
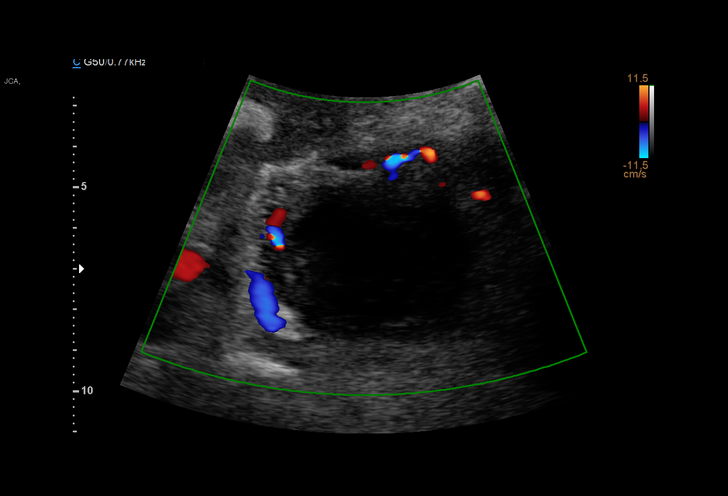
[im 5/53]
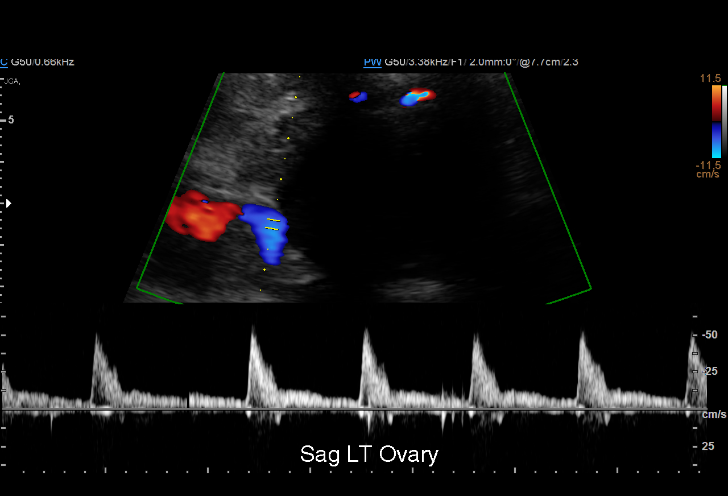
[im 7/53]
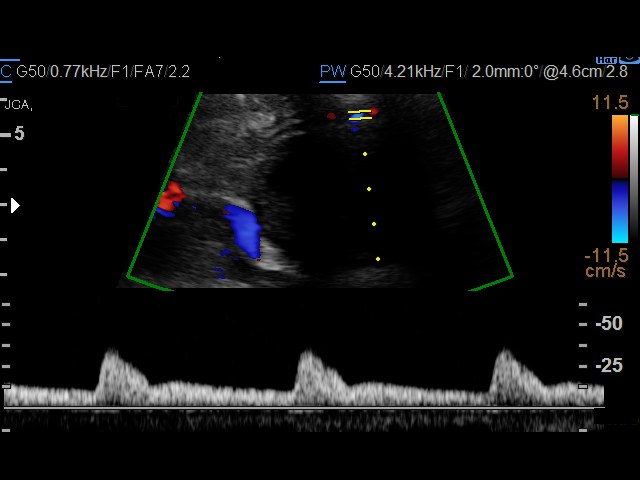
[im 11/53]
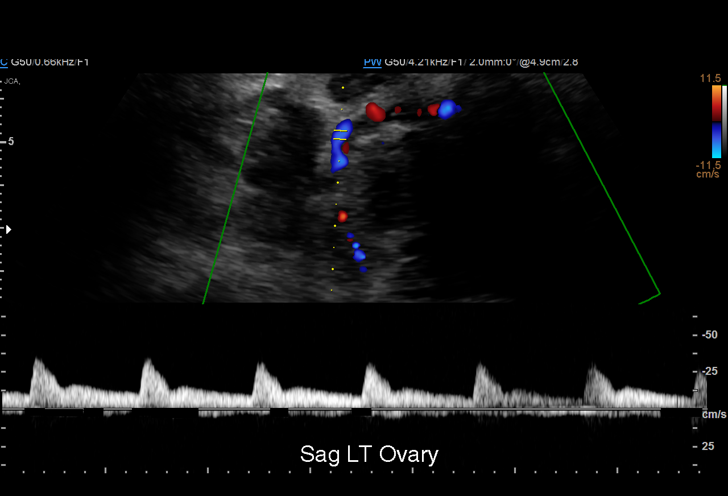
[im 16/53]
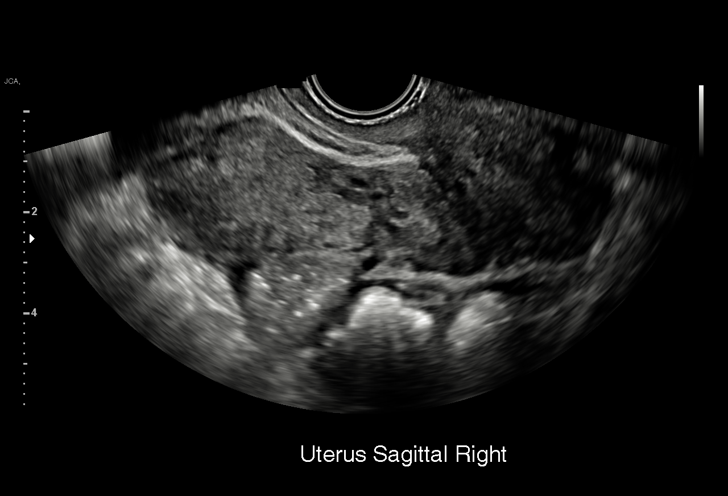
[im 18/53]
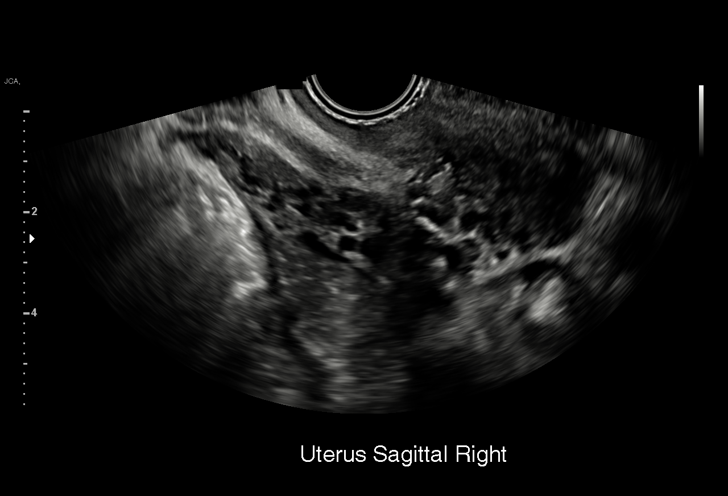
[im 22/53]
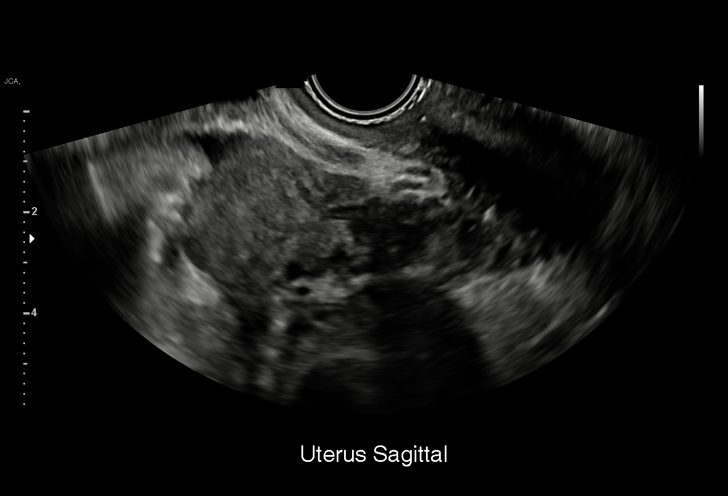
[im 24/53]
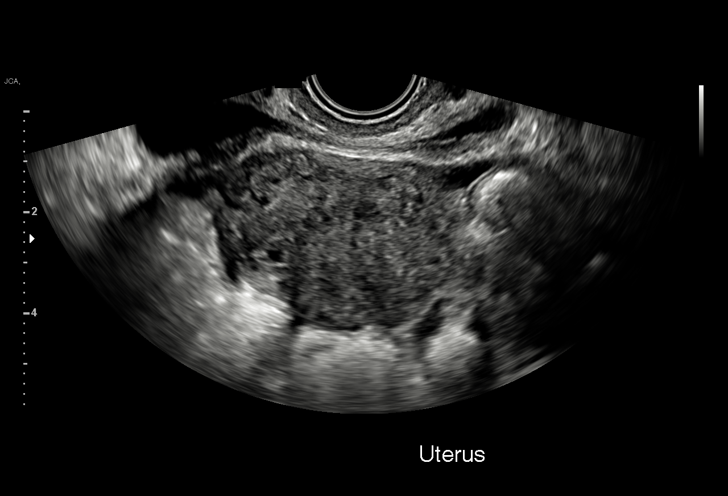
[im 29/53]
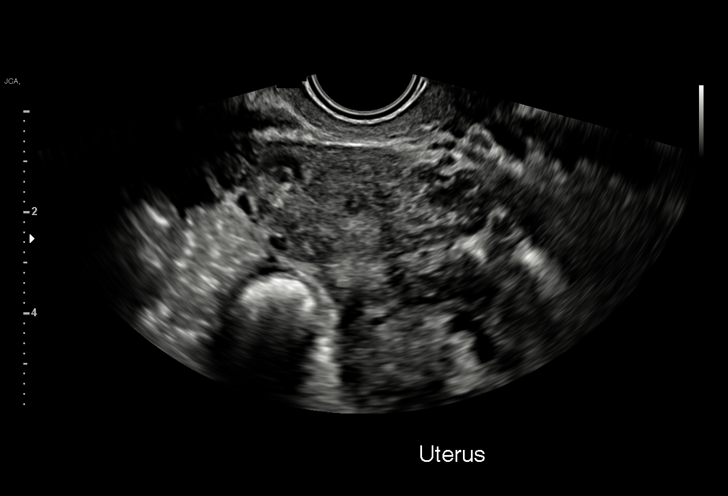
[im 31/53]
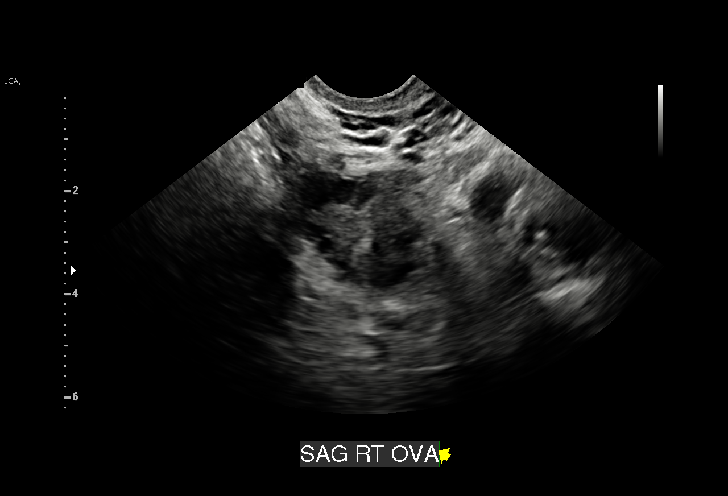
[im 35/53]
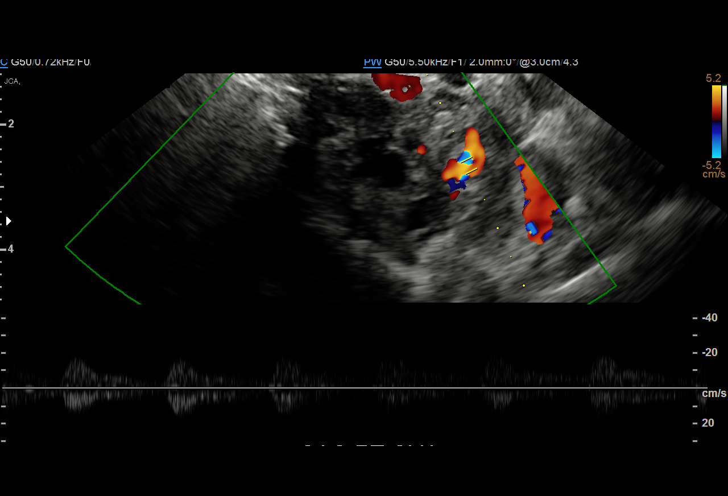
[im 37/53]
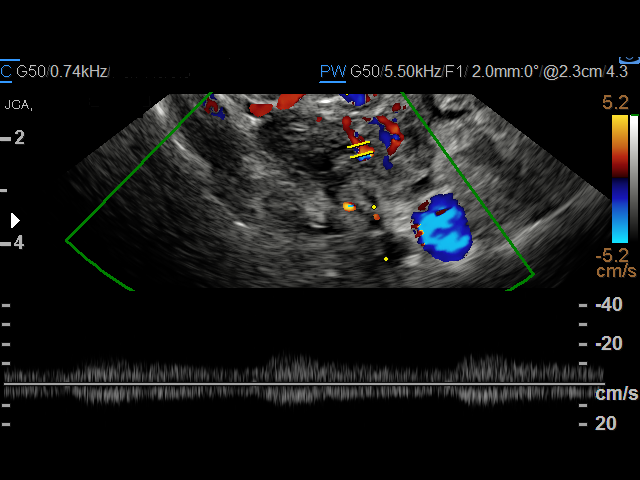
[im 42/53]
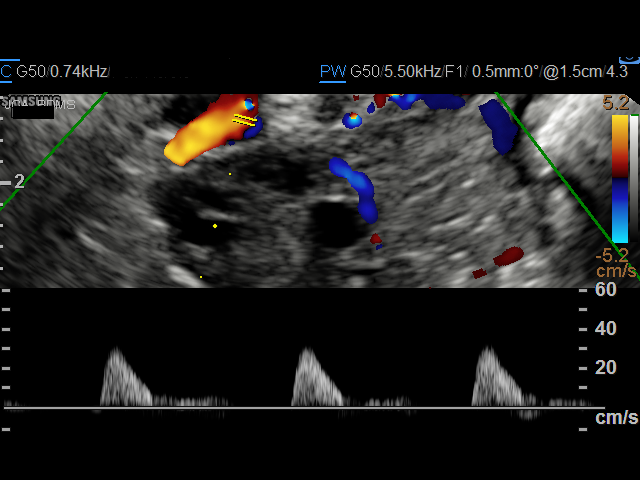
[im 46/53]
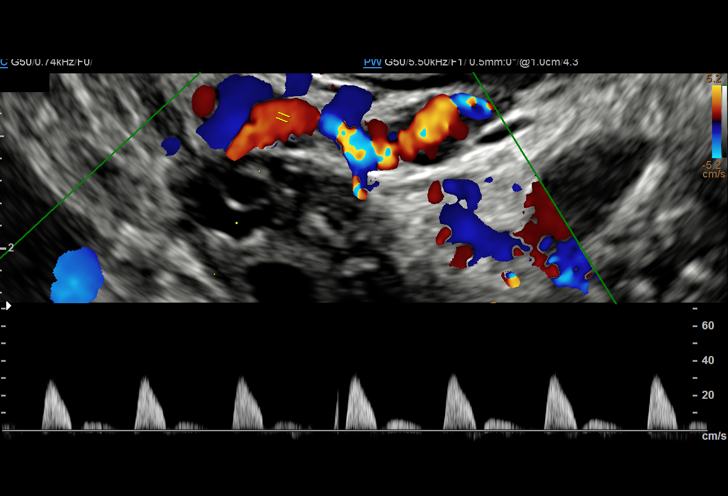
[im 48/53]
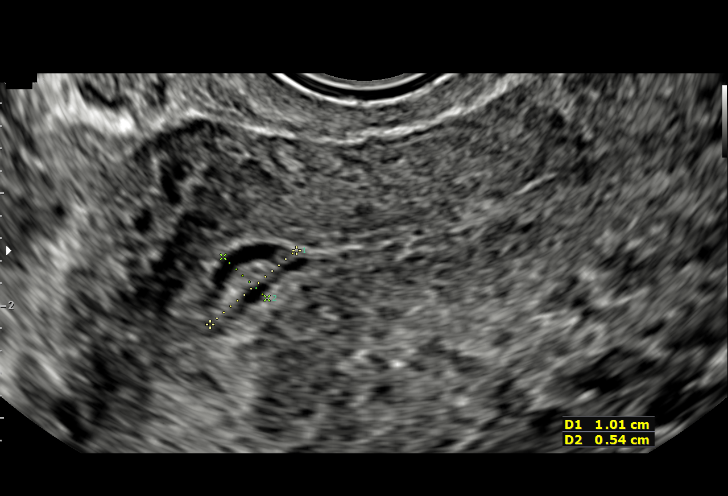
[im 53/53]
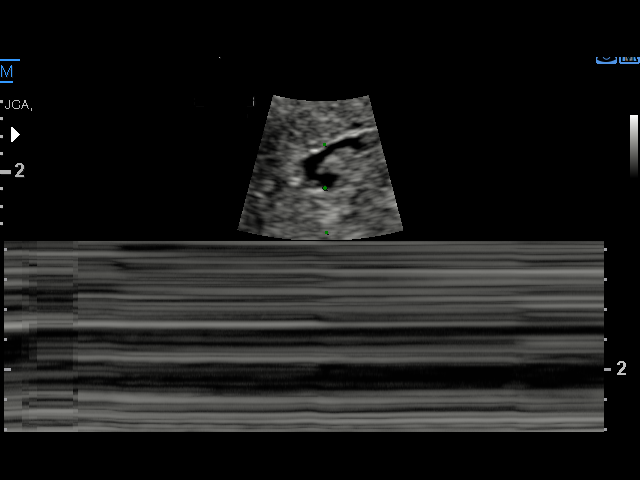

[16 of 25 positions shown; findings below may reference images not displayed]

FINDINGS: The enlarged left ovary is again identified with cystic lesion as
previously described. Pulsed Doppler evaluation of both ovaries
demonstrates normal low-resistance arterial and venous waveforms.

Note is made of interval removal of the previously seen IUD. A small
amount of fluid is noted within the endometrial canal likely related
to the recent IUD removal.
IMPRESSION: Normal ovarian waveforms bilaterally. No findings to suggest torsion
are seen.

Stable appearing cystic lesion within the left ovary. Follow-up as
previously described in earlier exam.

Interval removal of an IUD.

## 2023-08-18 DIAGNOSIS — Z79899 Other long term (current) drug therapy: Secondary | ICD-10-CM | POA: Diagnosis not present

## 2023-08-18 DIAGNOSIS — R41840 Attention and concentration deficit: Secondary | ICD-10-CM | POA: Diagnosis not present

## 2023-09-09 DIAGNOSIS — R41840 Attention and concentration deficit: Secondary | ICD-10-CM | POA: Diagnosis not present

## 2023-09-15 ENCOUNTER — Other Ambulatory Visit (HOSPITAL_BASED_OUTPATIENT_CLINIC_OR_DEPARTMENT_OTHER): Payer: Self-pay

## 2023-09-15 DIAGNOSIS — Z79899 Other long term (current) drug therapy: Secondary | ICD-10-CM | POA: Diagnosis not present

## 2023-09-15 DIAGNOSIS — F902 Attention-deficit hyperactivity disorder, combined type: Secondary | ICD-10-CM | POA: Diagnosis not present

## 2023-09-15 MED ORDER — METHYLPHENIDATE HCL 10 MG PO TABS
10.0000 mg | ORAL_TABLET | Freq: Every day | ORAL | 0 refills | Status: AC
  Filled 2023-09-15: qty 30, 30d supply, fill #0

## 2023-09-18 ENCOUNTER — Other Ambulatory Visit: Payer: Self-pay

## 2023-09-18 ENCOUNTER — Other Ambulatory Visit: Payer: Self-pay | Admitting: Internal Medicine

## 2023-09-18 ENCOUNTER — Other Ambulatory Visit (HOSPITAL_BASED_OUTPATIENT_CLINIC_OR_DEPARTMENT_OTHER): Payer: Self-pay

## 2023-09-18 DIAGNOSIS — F411 Generalized anxiety disorder: Secondary | ICD-10-CM

## 2023-09-18 MED ORDER — SERTRALINE HCL 50 MG PO TABS
75.0000 mg | ORAL_TABLET | Freq: Every day | ORAL | 0 refills | Status: DC
  Filled 2023-09-18: qty 45, 30d supply, fill #0

## 2023-10-08 ENCOUNTER — Other Ambulatory Visit: Payer: Self-pay

## 2023-10-23 ENCOUNTER — Other Ambulatory Visit: Payer: Self-pay | Admitting: Internal Medicine

## 2023-10-23 ENCOUNTER — Other Ambulatory Visit: Payer: Self-pay

## 2023-10-23 DIAGNOSIS — F411 Generalized anxiety disorder: Secondary | ICD-10-CM

## 2023-10-25 ENCOUNTER — Other Ambulatory Visit: Payer: Self-pay | Admitting: Internal Medicine

## 2023-10-25 ENCOUNTER — Other Ambulatory Visit (HOSPITAL_COMMUNITY): Payer: Self-pay

## 2023-10-25 DIAGNOSIS — F411 Generalized anxiety disorder: Secondary | ICD-10-CM

## 2023-10-27 ENCOUNTER — Other Ambulatory Visit (HOSPITAL_COMMUNITY): Payer: Self-pay

## 2023-10-28 ENCOUNTER — Other Ambulatory Visit: Payer: Self-pay | Admitting: Internal Medicine

## 2023-10-28 ENCOUNTER — Other Ambulatory Visit (HOSPITAL_BASED_OUTPATIENT_CLINIC_OR_DEPARTMENT_OTHER): Payer: Self-pay

## 2023-10-28 DIAGNOSIS — F411 Generalized anxiety disorder: Secondary | ICD-10-CM

## 2023-10-28 MED ORDER — SERTRALINE HCL 50 MG PO TABS
75.0000 mg | ORAL_TABLET | Freq: Every day | ORAL | 2 refills | Status: DC
  Filled 2023-10-28 (×2): qty 45, 30d supply, fill #0

## 2023-11-19 ENCOUNTER — Other Ambulatory Visit: Payer: Self-pay

## 2023-11-19 ENCOUNTER — Emergency Department (HOSPITAL_COMMUNITY)
Admission: EM | Admit: 2023-11-19 | Discharge: 2023-11-19 | Disposition: A | Discharge status: Home / Self Care | Payer: Self-pay | Attending: Emergency Medicine | Admitting: Emergency Medicine

## 2023-11-19 ENCOUNTER — Encounter (HOSPITAL_COMMUNITY): Payer: Self-pay

## 2023-11-19 DIAGNOSIS — H539 Unspecified visual disturbance: Secondary | ICD-10-CM | POA: Insufficient documentation

## 2023-11-19 NOTE — ED Provider Notes (Signed)
 [place] EMERGENCY DEPARTMENT AT [facility] Provider Note   CSN: [id number] Arrival date & time: 11/19/23  1007     History  Chief Complaint  Patient presents with   Eye Problem    Beverly Gonzalez is a 38 y.o. female.  The history is provided by the patient and medical records. No language interpreter was used.  Eye Problem Associated symptoms: no redness      38 year old female history of anxiety present for complaints of visual changes.  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  Since then, the blurry vision seems to be improved but she still noticed a crescent shaped blurry vision peripherally only on the left eye.  She denies any pain with eye movement, pressure behind eyes, loss of vision.  She also denies having any headache.  She does not wear contact lenses or prescription glasses.  Home Medications Prior to Admission medications   Medication Sig Start Date End Date Taking? Authorizing Provider  acetaminophen  (TYLENOL ) 325 MG tablet Take 2 tablets (650 mg total) by mouth every 6 (six) hours as needed. 02/18/23   Ozan, Jennifer, DO  cetirizine (ZYRTEC) 10 MG tablet Take 10 mg by mouth daily.    [provider]  levonorgestrel  (MIRENA ) 20 MCG/DAY IUD 1 each by Intrauterine route once.    [provider]  lisdexamfetamine (VYVANSE ) 20 MG capsule Take 1 capsule by mouth daily 08/12/23     methylphenidate  (RITALIN ) 10 MG tablet Take 1 tablet (10 mg total) by mouth daily. 09/15/23     metroNIDAZOLE  (METROCREAM ) 0.75 % cream Apply a dime size amount to face topically 2 (two) times daily. 05/30/23     ondansetron  (ZOFRAN -ODT) 4 MG disintegrating tablet Take 1 tablet (4 mg total) by mouth every 8 (eight) hours as needed. Patient not taking: Reported on 02/28/2023 02/18/23   Ozan, Jennifer, DO  Prenatal Vit-Fe  Fumarate-FA (PRENATAL PO) Take 1 tablet by mouth daily.    [provider]  sertraline  (ZOLOFT ) 50 MG tablet Take 1.5 tablets (75 mg total) by mouth daily. 10/28/23   Zilphia Hilt, Charyl Coppersmith, MD  VITAMIN D PO Take 5,000 Units by mouth daily.    [provider]      Allergies    Patient has no known allergies.    Review of Systems   Review of Systems  Eyes:  Positive for visual disturbance. Negative for redness.    Physical Exam Updated Vital Signs BP 117/78 (BP Location: Left Arm)   Pulse (!) 53   Temp 98.1 F (36.7 C) (Oral)   Resp 17   Ht 5\' 10"  (1.778 m)   Wt 70.8 kg   SpO2 100%   BMI 22.38 kg/m  Physical Exam Vitals and nursing note reviewed.  Constitutional:      General: She is not in acute distress.    Appearance: She is well-developed.  HENT:     Head: Atraumatic.  Eyes:     General: Lids are normal. Lids are everted, no foreign bodies appreciated. Vision grossly intact. Gaze aligned appropriately.     Conjunctiva/sclera: Conjunctivae normal.     Right eye: Right conjunctiva is not injected. No chemosis, exudate or hemorrhage.    Left eye: Left conjunctiva is not injected. No chemosis, exudate or hemorrhage.    Visual Fields: Right eye visual  fields normal and left eye visual fields normal.  Pulmonary:     Effort: Pulmonary effort is normal.  Musculoskeletal:     Cervical back: Neck supple.  Skin:    Findings: No rash.  Neurological:     Mental Status: She is alert.  Psychiatric:        Mood and Affect: Mood normal.    ED Results / Procedures / Treatments   Labs (all labs ordered are listed, but only abnormal results are displayed) Labs Reviewed - No data to display  EKG None  Radiology No results found.  Procedures Procedures    Medications Ordered in ED Medications - No data to display  ED Course/ Medical Decision Making/ A&P                                 Medical Decision Making  BP 117/78 (BP Location: Left  Arm)   Pulse (!) 53   Temp 98.1 F (36.7 C) (Oral)   Resp 17   Ht 5\' 10"  (1.778 m)   Wt 70.8 kg   SpO2 100%   BMI 22.38 kg/m   89:40 AM  38 year old female history of anxiety present for complaints of visual changes.  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  Since then, the blurry vision seems to be improved but she still noticed a crescent shaped blurry vision peripherally only on the left eye.  She denies any pain with eye movement, pressure behind eyes, loss of vision.  She also denies having any headache.  She does not wear contact lenses or prescription glasses.  On exam patient is resting comfortably appears to be in no acute discomfort.  Pupils equal round reactive and extraocular movements intact.  Normal peripheral vision able to detect finger movement and numbers of fingers.  No conjunctival injection corneas clear and lens are clear  I appreciate consultation from on-call ophthalmologist Dr. Grisson who agrees to see patient in the office today for further evaluation.        Final Clinical Impression(s) / ED Diagnoses Final diagnoses:  Unspecified visual disturbance    Rx / DC Orders ED Discharge Orders     None         Debbra Fairy, PA-C 11/19/23 1218    Hershel Los, MD 11/19/23 1339

## 2023-11-19 NOTE — Discharge Instructions (Signed)
 Please go to ophthalmology office today for further evaluation of your visual disturbance.

## 2023-11-19 NOTE — ED Triage Notes (Addendum)
 Patient comes from MD office after having blurry vision in left lateral part of left eye.  Patient reports it looks like a crescent moon.  Denies pain or headache.

## 2023-11-20 ENCOUNTER — Other Ambulatory Visit (INDEPENDENT_AMBULATORY_CARE_PROVIDER_SITE_OTHER): Payer: Self-pay | Admitting: Adult Health

## 2023-11-20 ENCOUNTER — Other Ambulatory Visit (HOSPITAL_BASED_OUTPATIENT_CLINIC_OR_DEPARTMENT_OTHER): Payer: Self-pay

## 2023-11-20 DIAGNOSIS — F411 Generalized anxiety disorder: Secondary | ICD-10-CM

## 2023-11-20 MED ORDER — SERTRALINE HCL 50 MG PO TABS
75.0000 mg | ORAL_TABLET | Freq: Every day | ORAL | 1 refills | Status: DC
  Filled 2023-11-20: qty 135, 90d supply, fill #0
  Filled 2024-02-17: qty 135, 90d supply, fill #1

## 2023-12-26 ENCOUNTER — Other Ambulatory Visit: Payer: Self-pay

## 2023-12-30 ENCOUNTER — Other Ambulatory Visit: Payer: Self-pay

## 2023-12-30 ENCOUNTER — Telehealth: Payer: Self-pay | Admitting: *Deleted

## 2023-12-30 ENCOUNTER — Other Ambulatory Visit: Payer: Self-pay | Admitting: Neurology

## 2023-12-30 MED ORDER — DYSPORT 500 UNITS IM SOLR
500.0000 [IU] | Freq: Once | INTRAMUSCULAR | 0 refills | Status: AC
  Filled 2023-12-30: qty 1, 1d supply, fill #0

## 2023-12-30 NOTE — Telephone Encounter (Signed)
 Pt's [employer] plan is inactive. Sent MyChart message requesting new insurance information.

## 2024-01-01 ENCOUNTER — Other Ambulatory Visit (HOSPITAL_COMMUNITY): Payer: Self-pay

## 2024-02-17 ENCOUNTER — Other Ambulatory Visit (HOSPITAL_COMMUNITY): Payer: Self-pay

## 2024-02-20 ENCOUNTER — Other Ambulatory Visit: Payer: Self-pay

## 2024-02-20 ENCOUNTER — Other Ambulatory Visit (INDEPENDENT_AMBULATORY_CARE_PROVIDER_SITE_OTHER): Payer: Self-pay | Admitting: Adult Health

## 2024-02-20 ENCOUNTER — Other Ambulatory Visit (HOSPITAL_COMMUNITY): Payer: Self-pay

## 2024-02-20 ENCOUNTER — Other Ambulatory Visit (HOSPITAL_BASED_OUTPATIENT_CLINIC_OR_DEPARTMENT_OTHER): Payer: Self-pay

## 2024-02-20 MED ORDER — ESCITALOPRAM OXALATE 10 MG PO TABS
10.0000 mg | ORAL_TABLET | Freq: Every day | ORAL | 1 refills | Status: AC
  Filled 2024-02-20: qty 90, 90d supply, fill #0
  Filled 2024-05-18: qty 90, 90d supply, fill #1

## 2024-02-20 MED ORDER — ONDANSETRON 4 MG PO TBDP
4.0000 mg | ORAL_TABLET | Freq: Three times a day (TID) | ORAL | 0 refills | Status: AC | PRN
  Filled 2024-02-20: qty 20, 7d supply, fill #0

## 2024-03-11 ENCOUNTER — Encounter: Payer: Self-pay | Admitting: Neurology

## 2024-03-15 ENCOUNTER — Other Ambulatory Visit: Payer: Self-pay

## 2024-03-15 ENCOUNTER — Other Ambulatory Visit (HOSPITAL_COMMUNITY): Payer: Self-pay

## 2024-03-17 ENCOUNTER — Other Ambulatory Visit (HOSPITAL_BASED_OUTPATIENT_CLINIC_OR_DEPARTMENT_OTHER): Payer: Self-pay

## 2024-03-17 ENCOUNTER — Other Ambulatory Visit: Payer: Self-pay | Admitting: Internal Medicine

## 2024-03-17 ENCOUNTER — Telehealth: Payer: Self-pay | Admitting: Neurology

## 2024-03-17 DIAGNOSIS — Z2989 Encounter for other specified prophylactic measures: Secondary | ICD-10-CM

## 2024-03-17 DIAGNOSIS — G244 Idiopathic orofacial dystonia: Secondary | ICD-10-CM

## 2024-03-17 MED ORDER — ACETAZOLAMIDE 125 MG PO TABS
125.0000 mg | ORAL_TABLET | Freq: Two times a day (BID) | ORAL | 0 refills | Status: AC
Start: 2024-03-17 — End: ?
  Filled 2024-03-17: qty 30, 15d supply, fill #0

## 2024-03-17 NOTE — Telephone Encounter (Signed)
 Last injection was in Feb 2025, for oral mandibular dystonia.  I do not do injection for that condition, please refer her out

## 2024-03-17 NOTE — Telephone Encounter (Signed)
 Pt previously saw Dr. Ines for Dysport  injections for orofacial dystonia and is requesting an appointment. Is this something you are able to see for or would she need referred elsewhere?

## 2024-03-25 ENCOUNTER — Other Ambulatory Visit (HOSPITAL_COMMUNITY): Payer: Self-pay

## 2024-04-04 NOTE — Addendum Note (Signed)
 Addended by: MARGARET CARNE R on: 04/04/2024 02:04 PM   Modules accepted: Orders

## 2024-04-04 NOTE — Telephone Encounter (Signed)
 Orders Placed This Encounter  Procedures   Ambulatory referral to Physical Medicine Rehab   EDUARD FABIENE HANLON, MD 04/04/2024, 2:04 PM Certified in Neurology, Neurophysiology and Neuroimaging  [facility] [address] [place], [place] [address] [phone]

## 2024-04-05 ENCOUNTER — Telehealth: Payer: Self-pay | Admitting: Diagnostic Neuroimaging

## 2024-04-05 NOTE — Telephone Encounter (Signed)
 Referral for physical medicine rehabilitation sent through [facility]  to [facility]. [phone], Fax: [phone]

## 2024-04-15 ENCOUNTER — Ambulatory Visit (INDEPENDENT_AMBULATORY_CARE_PROVIDER_SITE_OTHER): Payer: Self-pay | Admitting: Internal Medicine

## 2024-04-15 ENCOUNTER — Encounter: Payer: Self-pay | Admitting: Internal Medicine

## 2024-04-15 VITALS — BP 102/70 | Temp 97.8°F | Ht 71.0 in | Wt 157.1 lb

## 2024-04-15 DIAGNOSIS — Z0000 Encounter for general adult medical examination without abnormal findings: Secondary | ICD-10-CM | POA: Diagnosis not present

## 2024-04-15 NOTE — Progress Notes (Signed)
 Established Patient Office Visit     CC/Reason for Visit: Annual preventive exam  HPI: Beverly Gonzalez is a 38 y.o. female who is coming in today for the above mentioned reasons. Past Medical History is significant for: Generalized anxiety on escitalopram .  No major concerns or complaints.  Is up-to-date on eye and dental exam.  She had labs earlier this year and will forward to me for review.  She already had her flu vaccine and is up-to-date with GYN care.   Past Medical/Surgical History: Past Medical History:  Diagnosis Date   Anxiety    Severe preeclampsia, third trimester 11/18/2016   Vaginal Pap smear, abnormal     Past Surgical History:  Procedure Laterality Date   CESAREAN SECTION N/A 11/20/2016   Procedure: CESAREAN SECTION;  Surgeon: Marilynn Nest, DO;  Location: [facility];  Service: Obstetrics;  Laterality: N/A;   COLONOSCOPY     DIAGNOSTIC LAPAROSCOPY WITH REMOVAL OF ECTOPIC PREGNANCY Left 04/02/2021   Procedure: DIAGNOSTIC LAPAROSCOPY, EVACUATION OF HEMOPERITONEUM;  Surgeon: Jayne Vonn DEL, MD;  Location: [facility] OR;  Service: Gynecology;  Laterality: Left;   INSERTION OF NON VAGINAL CONTRACEPTIVE DEVICE N/A 02/18/2023   Procedure: INSERTION OF IUD;  Surgeon: Ozan, Jennifer, DO;  Location: [facility] ORS;  Service: Gynecology;  Laterality: N/A;   TONSILLECTOMY     WISDOM TOOTH EXTRACTION     XI ROBOTIC ASSISTED SALPINGECTOMY Bilateral 02/18/2023   Procedure: XI ROBOTIC ASSISTED SALPINGECTOMY;  Surgeon: Marilynn Nest, DO;  Location: [facility] OR[facility];  Service: Gynecology;  Laterality: Bilateral;    Social History:  reports that she has never smoked. She has never used smokeless tobacco. She reports that she does not drink alcohol and does not use drugs.  Allergies: No Known Allergies  Family History:  Family History  Problem Relation Age of Onset   Hypertension Mother    Diabetes Father    Hypertension Father    Heart disease Maternal Grandmother    Heart disease  Maternal Grandfather    Other Maternal Grandfather        covid   Stroke Maternal Grandfather    Diabetes Paternal Grandfather    Heart disease Paternal Grandfather    Stroke Paternal Grandfather    Heart attack Paternal Grandfather    Factor V Leiden deficiency Paternal Grandmother      Current Outpatient Medications:    acetaminophen  (TYLENOL ) 325 MG tablet, Take 2 tablets (650 mg total) by mouth every 6 (six) hours as needed., Disp: , Rfl:    acetaZOLAMIDE (DIAMOX) 125 MG tablet, Start 24 hours before ascent.  Take 1 tablet (125 mg) twice daily.  Discontinue 2 days upon descent., Disp: 30 tablet, Rfl: 0   cetirizine (ZYRTEC) 10 MG tablet, Take 10 mg by mouth daily., Disp: , Rfl:    escitalopram  (LEXAPRO ) 10 MG tablet, Take 1 tablet (10 mg total) by mouth daily., Disp: 90 tablet, Rfl: 1   levonorgestrel  (MIRENA ) 20 MCG/DAY IUD, 1 each by Intrauterine route once., Disp: , Rfl:    lisdexamfetamine (VYVANSE ) 20 MG capsule, Take 1 capsule by mouth daily, Disp: 30 capsule, Rfl: 0   methylphenidate  (RITALIN ) 10 MG tablet, Take 1 tablet (10 mg total) by mouth daily., Disp: 30 tablet, Rfl: 0   metroNIDAZOLE  (METROCREAM ) 0.75 % cream, Apply a dime size amount to face topically 2 (two) times daily., Disp: 45 g, Rfl: 11   ondansetron  (ZOFRAN -ODT) 4 MG disintegrating tablet, Take 1 tablet (4 mg total) by mouth every 8 (eight) hours  as needed., Disp: 20 tablet, Rfl: 0   Prenatal Vit-Fe Fumarate-FA (PRENATAL PO), Take 1 tablet by mouth daily., Disp: , Rfl:    VITAMIN D PO, Take 5,000 Units by mouth daily., Disp: , Rfl:   Current Facility-Administered Medications:    AbobotulinumtoxinA  (DYSPORT ) 500 units injection 167 Units, 167 Units, Intramuscular, Once, Ines Paul B, MD   AbobotulinumtoxinA  (DYSPORT ) 500 units injection 500 Units, 500 Units, Intramuscular, Once, Ines Paul B, MD   AbobotulinumtoxinA  (DYSPORT ) 500 units injection 500 Units, 500 Units, Intramuscular, Once, Ines Paul NOVAK,  MD  Review of Systems:  Negative unless indicated in HPI.   Physical Exam: Vitals:   04/15/24 0702  BP: 102/70  Temp: 97.8 F (36.6 C)  TempSrc: Oral  Weight: 157 lb 1.6 oz (71.3 kg)  Height: 5' 11 (1.803 m)    Body mass index is 21.91 kg/m.   Physical Exam Vitals reviewed.  Constitutional:      General: She is not in acute distress.    Appearance: Normal appearance. She is not ill-appearing, toxic-appearing or diaphoretic.  HENT:     Head: Normocephalic.     Right Ear: Tympanic membrane, ear canal and external ear normal. There is no impacted cerumen.     Left Ear: Tympanic membrane, ear canal and external ear normal. There is no impacted cerumen.     Nose: Nose normal.     Mouth/Throat:     Mouth: Mucous membranes are moist.     Pharynx: Oropharynx is clear. No oropharyngeal exudate or posterior oropharyngeal erythema.  Eyes:     General: No scleral icterus.       Right eye: No discharge.        Left eye: No discharge.     Conjunctiva/sclera: Conjunctivae normal.     Pupils: Pupils are equal, round, and reactive to light.  Neck:     Vascular: No carotid bruit.  Cardiovascular:     Rate and Rhythm: Normal rate and regular rhythm.     Pulses: Normal pulses.     Heart sounds: Normal heart sounds.  Pulmonary:     Effort: Pulmonary effort is normal. No respiratory distress.     Breath sounds: Normal breath sounds.  Abdominal:     General: Abdomen is flat. Bowel sounds are normal.     Palpations: Abdomen is soft.  Musculoskeletal:        General: Normal range of motion.     Cervical back: Normal range of motion.  Skin:    General: Skin is warm and dry.  Neurological:     General: No focal deficit present.     Mental Status: She is alert and oriented to person, place, and time. Mental status is at baseline.  Psychiatric:        Mood and Affect: Mood normal.        Behavior: Behavior normal.        Thought Content: Thought content normal.        Judgment:  Judgment normal.       Impression and Plan:  Encounter for preventive health examination   -Recommend routine eye and dental care. -Healthy lifestyle discussed in detail. -Labs to be updated today. -Prostate cancer screening: Not applicable Health Maintenance  Topic Date Due   Hepatitis C Screening  Never done   Hepatitis B Vaccine (1 of 3 - 19+ 3-dose series) Never done   HPV Vaccine (1 - 3-dose SCDM series) Never done   COVID-19 Vaccine (4 - 2025-26 season)  02/16/2024   Pap with HPV screening  03/20/2028   DTaP/Tdap/Td vaccine (4 - Td or Tdap) 01/21/2029   Flu Shot  Completed   HIV Screening  Completed   Pneumococcal Vaccine  Aged Out   Meningitis B Vaccine  Aged Out        Leidi Astle Theophilus Andrews, MD [facility]

## 2024-05-18 ENCOUNTER — Other Ambulatory Visit (HOSPITAL_BASED_OUTPATIENT_CLINIC_OR_DEPARTMENT_OTHER): Payer: Self-pay

## 2024-06-04 ENCOUNTER — Other Ambulatory Visit (HOSPITAL_BASED_OUTPATIENT_CLINIC_OR_DEPARTMENT_OTHER): Payer: Self-pay

## 2024-06-04 MED ORDER — AZELAIC ACID 15 % EX GEL
Freq: Two times a day (BID) | CUTANEOUS | 10 refills | Status: AC
  Filled 2024-06-04: qty 50, 20d supply, fill #0

## 2024-07-05 ENCOUNTER — Encounter: Payer: Self-pay | Admitting: Physical Medicine & Rehabilitation

## 2024-08-13 ENCOUNTER — Encounter: Admitting: Physical Medicine & Rehabilitation
